# Patient Record
Sex: Female | Born: 1950 | Race: White | Hispanic: No | Smoking: Former smoker
Health system: Southern US, Community
[De-identification: ages and names within clinical notes are randomized; demographics above are authoritative.]

## PROBLEM LIST (undated history)

## (undated) DIAGNOSIS — K635 Polyp of colon: Secondary | ICD-10-CM

## (undated) DIAGNOSIS — M1990 Unspecified osteoarthritis, unspecified site: Secondary | ICD-10-CM

## (undated) DIAGNOSIS — Z720 Tobacco use: Secondary | ICD-10-CM

## (undated) DIAGNOSIS — M25552 Pain in left hip: Principal | ICD-10-CM

## (undated) DIAGNOSIS — Z87891 Personal history of nicotine dependence: Secondary | ICD-10-CM

## (undated) DIAGNOSIS — C801 Malignant (primary) neoplasm, unspecified: Secondary | ICD-10-CM

## (undated) DIAGNOSIS — Z0000 Encounter for general adult medical examination without abnormal findings: Secondary | ICD-10-CM

## (undated) DIAGNOSIS — E785 Hyperlipidemia, unspecified: Secondary | ICD-10-CM

## (undated) DIAGNOSIS — F418 Other specified anxiety disorders: Secondary | ICD-10-CM

## (undated) DIAGNOSIS — B019 Varicella without complication: Secondary | ICD-10-CM

## (undated) DIAGNOSIS — R030 Elevated blood-pressure reading, without diagnosis of hypertension: Secondary | ICD-10-CM

## (undated) HISTORY — DX: Elevated blood-pressure reading, without diagnosis of hypertension: R03.0

## (undated) HISTORY — DX: Unspecified osteoarthritis, unspecified site: M19.90

## (undated) HISTORY — DX: Personal history of nicotine dependence: Z87.891

## (undated) HISTORY — DX: Pain in left hip: M25.552

## (undated) HISTORY — PX: WRIST FRACTURE SURGERY: SHX121

## (undated) HISTORY — DX: Malignant (primary) neoplasm, unspecified: C80.1

## (undated) HISTORY — DX: Varicella without complication: B01.9

## (undated) HISTORY — DX: Other specified anxiety disorders: F41.8

## (undated) HISTORY — DX: Tobacco use: Z72.0

## (undated) HISTORY — DX: Hyperlipidemia, unspecified: E78.5

## (undated) HISTORY — DX: Polyp of colon: K63.5

## (undated) HISTORY — PX: COLONOSCOPY: SHX174

## (undated) HISTORY — PX: POLYPECTOMY: SHX149

## (undated) HISTORY — DX: Encounter for general adult medical examination without abnormal findings: Z00.00

---

## 1996-11-28 HISTORY — PX: BREAST LUMPECTOMY: SHX2

## 1998-09-16 ENCOUNTER — Other Ambulatory Visit: Admission: RE | Admit: 1998-09-16 | Discharge: 1998-09-16 | Payer: Self-pay | Admitting: Obstetrics and Gynecology

## 1999-10-14 ENCOUNTER — Other Ambulatory Visit: Admission: RE | Admit: 1999-10-14 | Discharge: 1999-10-14 | Payer: Self-pay | Admitting: Obstetrics and Gynecology

## 2000-02-18 ENCOUNTER — Encounter (INDEPENDENT_AMBULATORY_CARE_PROVIDER_SITE_OTHER): Payer: Self-pay

## 2000-02-18 ENCOUNTER — Other Ambulatory Visit: Admission: RE | Admit: 2000-02-18 | Discharge: 2000-02-18 | Payer: Self-pay | Admitting: Obstetrics and Gynecology

## 2000-10-17 ENCOUNTER — Other Ambulatory Visit: Admission: RE | Admit: 2000-10-17 | Discharge: 2000-10-17 | Payer: Self-pay | Admitting: Obstetrics and Gynecology

## 2002-01-02 ENCOUNTER — Other Ambulatory Visit: Admission: RE | Admit: 2002-01-02 | Discharge: 2002-01-02 | Payer: Self-pay | Admitting: Obstetrics and Gynecology

## 2003-02-25 ENCOUNTER — Other Ambulatory Visit: Admission: RE | Admit: 2003-02-25 | Discharge: 2003-02-25 | Payer: Self-pay | Admitting: Obstetrics and Gynecology

## 2004-10-25 ENCOUNTER — Other Ambulatory Visit: Admission: RE | Admit: 2004-10-25 | Discharge: 2004-10-25 | Payer: Self-pay | Admitting: Obstetrics and Gynecology

## 2005-09-02 ENCOUNTER — Ambulatory Visit: Payer: Self-pay | Admitting: Oncology

## 2005-10-31 ENCOUNTER — Other Ambulatory Visit: Admission: RE | Admit: 2005-10-31 | Discharge: 2005-10-31 | Payer: Self-pay | Admitting: Obstetrics and Gynecology

## 2006-08-24 ENCOUNTER — Ambulatory Visit: Payer: Self-pay | Admitting: Oncology

## 2006-11-01 ENCOUNTER — Other Ambulatory Visit: Admission: RE | Admit: 2006-11-01 | Discharge: 2006-11-01 | Payer: Self-pay | Admitting: Obstetrics and Gynecology

## 2007-08-30 ENCOUNTER — Ambulatory Visit: Payer: Self-pay | Admitting: Oncology

## 2007-09-03 LAB — CBC WITH DIFFERENTIAL/PLATELET
Basophils Absolute: 0 10*3/uL (ref 0.0–0.1)
EOS%: 0.9 % (ref 0.0–7.0)
HGB: 13.1 g/dL (ref 11.6–15.9)
MCH: 31.2 pg (ref 26.0–34.0)
MCV: 89.6 fL (ref 81.0–101.0)
MONO%: 4.9 % (ref 0.0–13.0)
RDW: 13.3 % (ref 11.3–14.5)

## 2007-09-03 LAB — COMPREHENSIVE METABOLIC PANEL
AST: 13 U/L (ref 0–37)
Albumin: 4.4 g/dL (ref 3.5–5.2)
Alkaline Phosphatase: 62 U/L (ref 39–117)
BUN: 20 mg/dL (ref 6–23)
Creatinine, Ser: 0.6 mg/dL (ref 0.40–1.20)
Potassium: 3.8 mEq/L (ref 3.5–5.3)
Total Bilirubin: 0.3 mg/dL (ref 0.3–1.2)

## 2008-02-27 ENCOUNTER — Other Ambulatory Visit: Admission: RE | Admit: 2008-02-27 | Discharge: 2008-02-27 | Payer: Self-pay | Admitting: Obstetrics and Gynecology

## 2008-07-16 ENCOUNTER — Inpatient Hospital Stay (HOSPITAL_COMMUNITY): Admission: EM | Admit: 2008-07-16 | Discharge: 2008-07-17 | Payer: Self-pay | Admitting: Emergency Medicine

## 2008-07-16 ENCOUNTER — Encounter (INDEPENDENT_AMBULATORY_CARE_PROVIDER_SITE_OTHER): Payer: Self-pay | Admitting: General Surgery

## 2008-11-28 HISTORY — PX: APPENDECTOMY: SHX54

## 2009-10-08 ENCOUNTER — Ambulatory Visit: Payer: Self-pay | Admitting: Obstetrics and Gynecology

## 2009-10-08 ENCOUNTER — Encounter: Payer: Self-pay | Admitting: Obstetrics and Gynecology

## 2009-10-08 ENCOUNTER — Other Ambulatory Visit: Admission: RE | Admit: 2009-10-08 | Discharge: 2009-10-08 | Payer: Self-pay | Admitting: Obstetrics and Gynecology

## 2010-01-26 ENCOUNTER — Encounter (INDEPENDENT_AMBULATORY_CARE_PROVIDER_SITE_OTHER): Payer: Self-pay | Admitting: *Deleted

## 2010-02-25 ENCOUNTER — Encounter (INDEPENDENT_AMBULATORY_CARE_PROVIDER_SITE_OTHER): Payer: Self-pay | Admitting: *Deleted

## 2010-03-01 ENCOUNTER — Ambulatory Visit: Payer: Self-pay | Admitting: Gastroenterology

## 2010-03-16 ENCOUNTER — Ambulatory Visit: Payer: Self-pay | Admitting: Gastroenterology

## 2010-03-17 ENCOUNTER — Encounter: Payer: Self-pay | Admitting: Gastroenterology

## 2010-12-20 ENCOUNTER — Encounter: Payer: Self-pay | Admitting: Emergency Medicine

## 2010-12-30 NOTE — Miscellaneous (Signed)
Summary: [facility]  Clinical Lists Changes  Medications: Added new medication of MOVIPREP 100 GM  SOLR (PEG-KCL-NACL-NASULF-NA ASC-C) As per prep instructions. - Signed Rx of MOVIPREP 100 GM  SOLR (PEG-KCL-NACL-NASULF-NA ASC-C) As per prep instructions.;  #1 x 0;  Signed;  Entered by: Ezra Sites RN;  Authorized by: Meryl Dare MD [facility];  Method used: [phone] [address] [place], [place], [place]  [address], Ph: [phone] or [phone], Fax: [phone] Observations: Added new observation of NKA: T (03/01/2010 15:39)    Prescriptions: MOVIPREP 100 GM  SOLR (PEG-KCL-NACL-NASULF-NA ASC-C) As per prep instructions.  #1 x 0   Entered by:   Ezra Sites RN   Authorized by:   Meryl Dare MD [facility]   Signed by:   Ezra Sites RN on 03/01/2010   Method used:   [facility]        [facility]  [address] [phone]ail[facility]       [address]       [place], [place]  [address]       Ph: [phone] or [phone]       Fax: [phone]   RxID:   [id number]

## 2010-12-30 NOTE — Letter (Signed)
Summary: [facility] Instructions  [facility]  [address] [place], [place] [address]   Phone: [phone]  Fax: [phone]       [name]    1951/02/25    MRN: [id number]        Procedure Day /Date:  Tuesday 03/16/10     Arrival Time: 8:30 AM      Procedure Time:  9:30 AM     Location of Procedure:                    _X_  [facility] (4th Floor)                      PREPARATION FOR COLONOSCOPY WITH MOVIPREP   Starting 5 days prior to your procedure Thursday 03/11/10 do not eat nuts, seeds, popcorn, corn, beans, peas,  salads, or any raw vegetables.  Do not take any fiber supplements (e.g. Metamucil, Citrucel, and Benefiber).  THE DAY BEFORE YOUR PROCEDURE         DATE: 03/15/10  DAY: Monday  1.  Drink clear liquids the entire day-NO SOLID FOOD  2.  Do not drink anything colored red or purple.  Avoid juices with pulp.  No orange juice.  3.  Drink at least 64 oz. (8 glasses) of fluid/clear liquids during the day to prevent dehydration and help the prep work efficiently.  CLEAR LIQUIDS INCLUDE: Water Jello Ice Popsicles Tea (sugar ok, no milk/cream) Powdered fruit flavored drinks Coffee (sugar ok, no milk/cream) Gatorade Juice: apple, white grape, white cranberry  Lemonade Clear bullion, consomm, broth Carbonated beverages (any kind) Strained chicken noodle soup Hard Candy                             4.  In the morning, mix first dose of MoviPrep solution:    Empty 1 Pouch A and 1 Pouch B into the disposable container    Add lukewarm drinking water to the top line of the container. Mix to dissolve    Refrigerate (mixed solution should be used within 24 hrs)  5.  Begin drinking the prep at 5:00 p.m. The MoviPrep container is divided by 4 marks.   Every 15 minutes drink the solution down to the next mark (approximately 8 oz) until the full liter is complete.   6.  Follow completed prep with 16 oz of clear liquid of your choice  (Nothing red or purple).  Continue to drink clear liquids until bedtime.  7.  Before going to bed, mix second dose of MoviPrep solution:    Empty 1 Pouch A and 1 Pouch B into the disposable container    Add lukewarm drinking water to the top line of the container. Mix to dissolve    Refrigerate  THE DAY OF YOUR PROCEDURE      DATE: 03/16/10 DAY: Tuesday  Beginning at 4:30 a.m. (5 hours before procedure):         1. Every 15 minutes, drink the solution down to the next mark (approx 8 oz) until the full liter is complete.  2. Follow completed prep with 16 oz. of clear liquid of your choice.    3. You may drink clear liquids until 7:30 AM (2 HOURS BEFORE PROCEDURE).   MEDICATION INSTRUCTIONS  Unless otherwise instructed, you should take regular prescription medications with a small sip of water   as early as possible the morning  of your procedure.         OTHER INSTRUCTIONS  You will need a responsible adult at least 60 years of age to accompany you and drive you home.   This person must remain in the waiting room during your procedure.  Wear loose fitting clothing that is easily removed.  Leave jewelry and other valuables at home.  However, you may wish to bring a book to read or  an iPod/MP3 player to listen to music as you wait for your procedure to start.  Remove all body piercing jewelry and leave at home.  Total time from sign-in until discharge is approximately 2-3 hours.  You should go home directly after your procedure and rest.  You can resume normal activities the  day after your procedure.  The day of your procedure you should not:   Drive   Make legal decisions   Operate machinery   Drink alcohol   Return to work  You will receive specific instructions about eating, activities and medications before you leave.    The above instructions have been reviewed and explained to me by   Ezra Sites RN  March 01, 2010 3:59 PM    I fully understand  and can verbalize these instructions _____________________________ Date _________

## 2010-12-30 NOTE — Letter (Signed)
Summary: Patient Notice- Polyp Results  [facility]  [address] [place], [place] [address]   Phone: [phone]  Fax: [phone]        March 17, 2010 MRN: [id number]    SHARAINE DELANGE [address] [place], [place]  [address]    Dear Ms. Seidel,  I am pleased to inform you that the colon polyp(s) removed during your recent colonoscopy was (were) found to be benign (no cancer detected) upon pathologic examination.  I recommend you have a repeat colonoscopy examination in 3 years to look for recurrent polyps, as having colon polyps increases your risk for having recurrent polyps or even colon cancer in the future.  Should you develop new or worsening symptoms of abdominal pain, bowel habit changes or bleeding from the rectum or bowels, please schedule an evaluation with either your primary care physician or with me.  Continue treatment plan as outlined the day of your exam.  Please call us if you are having persistent problems or have questions about your condition that have not been fully answered at this time.  Sincerely,  Meryl Dare MD [facility]  This letter has been electronically signed by your physician.  Appended Document: Patient Notice- Polyp Results letter mailed 4.25.11

## 2010-12-30 NOTE — Letter (Signed)
Summary: Previsit letter  [facility]  [address] [place], [place] [address]   Phone: [phone]  Fax: [phone]       01/26/2010 MRN: [id number]  [name] [address] [place], [place]  [address]  Dear [name],  Welcome to the Gastroenterology Division at [facility].    You are scheduled to see a nurse for your pre-procedure visit on 03-01-10 at 3:30p.m. on the 3rd floor at [facility], [address]  We ask that you try to arrive at our office 15 minutes prior to your appointment time to allow for check-in.  Your nurse visit will consist of discussing your medical and surgical history, your immediate family medical history, and your medications.    Please bring a complete list of all your medications or, if you prefer, bring the medication bottles and we will list them.  We will need to be aware of both prescribed and over the counter drugs.  We will need to know exact dosage information as well.  If you are on blood thinners (Coumadin, Plavix, Aggrenox, Ticlid, etc.) please call our office today/prior to your appointment, as we need to consult with your physician about holding your medication.   Please be prepared to read and sign documents such as consent forms, a financial agreement, and acknowledgement forms.  If necessary, and with your consent, a friend or relative is welcome to sit-in on the nurse visit with you.  Please bring your insurance card so that we may make a copy of it.  If your insurance requires a referral to see a specialist, please bring your referral form from your primary care physician.  No co-pay is required for this nurse visit.     If you cannot keep your appointment, please call [phone] to cancel or reschedule prior to your appointment date.  This allows [place] the opportunity to schedule an appointment for another patient in need of care.    Thank you for choosing [facility] for your medical needs.   We appreciate the opportunity to care for you.  Please visit [place] at our website  to learn more about our practice.                     Sincerely.                                                                                                                   The Gastroenterology Division

## 2010-12-30 NOTE — Procedures (Signed)
Summary: Colonoscopy  Patient: [name] Note: All result statuses are Final unless otherwise noted.  Tests: (1) Colonoscopy (COL)   COL Colonoscopy           DONE     [facility]     [address]     [place], [place]  [address]           COLONOSCOPY PROCEDURE REPORT           PATIENT:  Brittany, Mitchell  MR#:  [id number]     BIRTHDATE:  [birth year], 58 yrs. old  GENDER:  female           ENDOSCOPIST:  Judie Petit T. Russella Dar, MD, [facility]     Referred by:  Edyth Gunnels, M.D.           PROCEDURE DATE:  03/16/2010     PROCEDURE:  Colonoscopy with snare polypectomy     ASA CLASS:  Class II     INDICATIONS:  1) Routine Risk Screening           MEDICATIONS:   Fentanyl 100 mcg IV, Versed 10 mg IV           DESCRIPTION OF PROCEDURE:   After the risks benefits and     alternatives of the procedure were thoroughly explained, informed     consent was obtained.  Digital rectal exam was performed and     revealed no abnormalities.   The LB PCF-H180AL [id number] endoscope     was introduced through the anus and advanced to the cecum, which     was identified by both the appendix and ileocecal valve, without     limitations.  The quality of the prep was excellent, using     MoviPrep.  The instrument was then slowly withdrawn as the colon     was fully examined.     <<PROCEDUREIMAGES>>           FINDINGS:  A sessile polyp was found in the ascending colon. It     was 6 mm in size. Polyp was snared, then cauterized with monopolar     cautery. Retrieval was successful. A sessile polyp was found at     the hepatic flexure. It was 3 mm in size. Polyp was snared without     cautery. Retrieval was successful.  A sessile polyp was found in     the descending colon. It was 5 mm in size. Polyp was snared     without cautery. Retrieval was successful. A pedunculated polyp     was found in the sigmoid colon. It was 7 mm in size. Polyp was     snared, then cauterized with monopolar cautery.  Retrieval was     successful. A sessile polyp was found in the sigmoid colon. It was     6 mm in size. Polyp was snared without cautery. Retrieval was     successful.   Moderate diverticulosis was found in the sigmoid     colon. This was otherwise a normal examination of the colon.     Retroflexed views in the rectum revealed no abnormalities. The     time to cecum =  3  minutes. The scope was then withdrawn (time =     11  min) from the patient and the procedure completed.     COMPLICATIONS:  None           ENDOSCOPIC IMPRESSION:     1) 6 mm sessile polyp  in the ascending colon     2) 3 mm sessile polyp at the hepatic flexure     3) 5 mm sessile polyp in the descending colon     4) 7 mm pedunculated polyp in the sigmoid colon     5) 6 mm sessile polyp in the sigmoid colon     6) Moderate diverticulosis in the sigmoid colon           RECOMMENDATIONS:     1) No aspirin or NSAIDs for 2 weeks     2) Await pathology results     3) Repeat Colonoscopy in 3 years if 3 or more adenomatous, 5     years if 1 o2 adenomatous, otherwise 10years     4) High fiber diet with liberal fluid intake.           Venita Lick. Russella Dar, MD, [facility]           CC:           n.     eSIGNED:   Venita Lick. Romolo Sieling at 03/16/2010 09:59 AM           Elenor Legato, [id number]  Note: An exclamation mark (!) indicates a result that was not dispersed into the flowsheet. Document Creation Date: 03/16/2010 10:01 AM _______________________________________________________________________  (1) Order result status: Final Collection or observation date-time: 03/16/2010 09:54 Requested date-time:  Receipt date-time:  Reported date-time:  Referring Physician:   Ordering Physician: Claudette Head [phone]) Specimen Source:  Source: Launa Grill Order Number: [id number] Lab site:   Appended Document: Colonoscopy     Procedures Next Due Date:    Colonoscopy: 02/2013

## 2011-04-12 NOTE — Op Note (Signed)
NAME:  [name], BONES NO.:  [id number]   MEDICAL RECORD NO.:  [id number]          PATIENT TYPE:  INP   LOCATION:  0105                         FACILITY:  [facility]   PHYSICIAN:  Almond Lint, MD       DATE OF BIRTH:  03-15-1951   DATE OF PROCEDURE:  07/16/2008  DATE OF DISCHARGE:                               OPERATIVE REPORT   PREOPERATIVE DIAGNOSIS:  Appendicitis.   POSTOPERATIVE DIAGNOSIS:  Appendicitis.   PROCEDURE:  Laparoscopic appendectomy.   SURGEON:  Almond Lint, M.D.   ASSISTANT:  Currie Paris, M.D.   ANESTHESIA:  General.   FINDINGS:  Acute suppurative nonperforated appendicitis with fecalith.   SPECIMENS:  Appendix to pathology.   ESTIMATED BLOOD LOSS:  Minimal.   COMPLICATIONS:  None.   PROCEDURE:  Ms. Hooper was identified correctly in the holding area  and taken to the operating room, where she was placed supine on the  operating room table.  General endotracheal anesthesia was induced, and  a Foley catheter was placed.  A time-out was performed, confirming the  patient's site of surgery, the preoperative antibiotic administration,  and the equipment.  This was all correct.  The patient then had her left  arm tucked, and her abdomen was prepped and draped in a sterile fashion.  A curvilinear incision was made in the infraumbilical region after  administration of 1% plain 0.25% lidocaine with epinephrine  infiltration.  The subcutaneous tissue was bluntly divided with a  tonsil, and a Kocher clamp was used to elevate the umbilical stalk.  Kocher clamps were then placed on the fascia on both sides of the  midline.  An incision was made in the midline of the fascia.  A tonsil  was used to verify that we had entered the abdomen through the fascia.  A 0 Vicryl on a UR6 was placed in a purse-string fashion around the  fascial defect.  The Hasson trocar was introduced, and pneumoperitoneum  was achieved to 15 mmHg.   The abdomen was  inspected, and the cecum and appendiceal area were  covered with the omentum at this time.  The left lower quadrant  abdominal wall was then inspected with a scope, and a 5 mm port was  placed under direct visualization in the usual fashion.  A Glassman  clamp was used to elevate the omentum off of the cecum, and immediately  the inflamed appendix was seen.  It was adherent to the terminal ileum  with purulent drainage.  The Glassman clamp was used to elevate the  appendix, and it separated easily from the terminal ileum.  An  additional 5 mm port was placed in the usual fashion far laterally in  the left lower quadrant.  The appendix was elevated, and blunt  dissection was used to continue the removal of the appendix from the  terminal ileum.  The harmonic scalpel was then used to divide the  appendiceal mesentery.  Following division of the mesentery all the way  to the base of the appendix, the Endo GIA was used to transect the  appendix at its  base.  Both tips of the stapler were seen going across  only the appendix.  This was held for 10 seconds, and then the stapler  was fired.  The appendix was then placed into an EndoCatch bag and  removed through the periumbilical port site.  Pneumoperitoneum was again  achieved, and the appendiceal stump was inspected.  There was no  bleeding seen.  No damage to the terminal ileum or cecum was seen.  The  ara was irrigated and suctioned.  The two 5 mm ports were then removed  under direct visualization with no bleeding.  The camera and Hasson  trocar were removed from the abdomen.  The purse-string suture was then  tied, and the fascia inspected to insure that there were no defects.  Following this, a 4-0 Monocryl was used to close the skin in a  subcuticular fashion.  The abdomen was then cleaned and dried.  The  incisions were closed with Dermabond.  The patient tolerated the  procedure well.  She was awakened from anesthesia and taken to the  PACU  in a stable condition.      Almond Lint, MD  Electronically Signed     FB/MEDQ  D:  07/16/2008  T:  07/16/2008  Job:  [id number]

## 2011-04-12 NOTE — H&P (Signed)
NAME:  [name], [name] NO.:  [id number]   MEDICAL RECORD NO.:  [id number]          PATIENT TYPE:  INP   LOCATION:  0105                         FACILITY:  [facility]   PHYSICIAN:  Almond Lint, MD       DATE OF BIRTH:  [birth year]   DATE OF ADMISSION:  07/16/2008  DATE OF DISCHARGE:                              HISTORY & PHYSICAL   REFERRING PHYSICIAN:  Dr. Elise Benne.   CHIEF COMPLAINT:  Abdominal pain.   HISTORY OF PRESENT ILLNESS:  [name] is  60 year old female who  began having abdominal discomfort yesterday at work.  She tried some  Alka-Seltzer, and it seemed to help a little bit; however, she did not  have any appetite for dinner.  She originally thought the pain was gas  pains, but it was not moving around as much as she had experienced with  gas pain.  It seemed to be in mostly the lower abdomen.  She also tried  Tums.  She woke up multiple times in the middle of the night for pain  and also had some mild diarrhea.  This morning, she was nauseated and  had some toast and threw that up.  Over the course of the day, the pain  has continued to worsen.  She presented to the [facility] Emergency  Department for evaluation.  A CT scan was done there, which was positive  for appendicitis.   PAST MEDICAL HISTORY:  1. Right breast cancer.  2. Osteoporosis.   PAST SURGICAL HISTORY:  Right lumpectomy.   MEDICATIONS:  Boniva.  Multiple vitamins.   ALLERGIES:  None.   SOCIAL HISTORY:  She smokes a half-pack per day.  Does not drink  alcohol.  Works in [employer].  She is accompanied by her husband.   FAMILY HISTORY:  Mother had diabetes and congestive heart failure and a  heart attack, otherwise not significant.   REVIEW OF SYSTEMS:  Negative for fevers and chills.  Positive for the  HPI.  Negative for 11 other systems.   PHYSICAL EXAMINATION:  Temperature is 98.6.  At [facility] ER, it is  97.6.  Pulse is 73.  Blood pressure is 101/64.  Respiratory rate  18.  Sats are 96% on room air.  She is alert and oriented x3 in no acute distress.  HEENT:  Normocephalic and atraumatic.  Pupils are equal, round and  reactive to light.  Oropharynx is clear.  NECK:  Supple with no lymphadenopathy, no thyromegaly.  Trachea is  midline.  There is no supra- or infraclavicular adenopathy.  HEART:  Regular rate and rhythm with no murmurs.  LUNGS:  Clear to auscultation bilaterally.  ABDOMEN:  Soft, nondistended.  Tender to deep palpation in the right  lower quadrant.  No rebound, no guarding.  No Rovsing's sign.   LABS:  From [facility], her electrolytes are within normal limits.  Specifically, with a creatinine of 0.6, lipase 30.  White blood cell  count is 17.5 with a neutrophil percentage of 87%.  UA is negative.   CT scan, which is actually renal stone protocol, but  demonstrates an  appendicolith x2 and a markedly distended appendix and inflammatory  changes to the adjacent fat.  This was viewed and discussed with the  radiologist.   ASSESSMENT:  [name] is a 60 year old female with acute  appendicitis.  We will take her to the operating room for a laparoscopic  appendectomy.  We will give her a dose of antibiotics, antiemetics, and  some pain medicine.  The risks and benefits of the procedure were  described to the patient.  She agrees to proceed.  We will take her to  the operating room as soon as one is available.      Almond Lint, MD  Electronically Signed     FB/MEDQ  D:  07/16/2008  T:  07/16/2008  Job:  [id number]

## 2011-04-15 NOTE — Discharge Summary (Signed)
NAMETALITHA, [name] NO.:  [id number]   MEDICAL RECORD NO.:  [id number]          PATIENT TYPE:  INP   LOCATION:  1530                         FACILITY:  [facility]   PHYSICIAN:  Almond Lint, MD       DATE OF BIRTH:  June 29, 1951   DATE OF ADMISSION:  07/16/2008  DATE OF DISCHARGE:  07/17/2008                               DISCHARGE SUMMARY   DIAGNOSIS:  Acute appendicitis.   PROCEDURES PERFORMED:  Laparoscopic appendectomy.   HOSPITAL COURSE:  [name] is a 60 year old female that was  originally seen in the [facility] Emergency Department with a CT scan  consistent with appendicitis and with leukocytosis.  She had story  consistent with appendicitis.  She was transferred to [facility] where  I consulted her and then agreed that she appeared to have acute  appendicitis.  She was taken to the operation room on July 16, 2008 to  undergo an uneventful laparoscopic appendectomy for suppurative  nongangrenous, nonperforated appendicitis.  The night of surgery she did  have a little bit of nausea and required some IV antiemetics and IV pain  medication.  However by the morning her nausea was resolved and she was  able to tolerate a regular diet with no difficulty.  Her pain was  controlled on oral narcotics.  She was able to void spontaneously and  ambulate and she was discharged to home in stable condition.      Almond Lint, MD  Electronically Signed     FB/MEDQ  D:  07/18/2008  T:  07/18/2008  Job:  [id number]

## 2011-05-04 ENCOUNTER — Ambulatory Visit (INDEPENDENT_AMBULATORY_CARE_PROVIDER_SITE_OTHER): Payer: 59 | Admitting: Family Medicine

## 2011-05-04 ENCOUNTER — Encounter: Payer: Self-pay | Admitting: Family Medicine

## 2011-05-04 DIAGNOSIS — E785 Hyperlipidemia, unspecified: Secondary | ICD-10-CM

## 2011-05-04 DIAGNOSIS — M25559 Pain in unspecified hip: Secondary | ICD-10-CM

## 2011-05-04 DIAGNOSIS — Z720 Tobacco use: Secondary | ICD-10-CM

## 2011-05-04 DIAGNOSIS — F419 Anxiety disorder, unspecified: Secondary | ICD-10-CM

## 2011-05-04 DIAGNOSIS — F341 Dysthymic disorder: Secondary | ICD-10-CM

## 2011-05-04 DIAGNOSIS — M25552 Pain in left hip: Secondary | ICD-10-CM

## 2011-05-04 DIAGNOSIS — M8580 Other specified disorders of bone density and structure, unspecified site: Secondary | ICD-10-CM

## 2011-05-04 DIAGNOSIS — C50911 Malignant neoplasm of unspecified site of right female breast: Secondary | ICD-10-CM | POA: Insufficient documentation

## 2011-05-04 DIAGNOSIS — R5383 Other fatigue: Secondary | ICD-10-CM

## 2011-05-04 DIAGNOSIS — M5410 Radiculopathy, site unspecified: Secondary | ICD-10-CM

## 2011-05-04 DIAGNOSIS — C50919 Malignant neoplasm of unspecified site of unspecified female breast: Secondary | ICD-10-CM

## 2011-05-04 DIAGNOSIS — IMO0002 Reserved for concepts with insufficient information to code with codable children: Secondary | ICD-10-CM

## 2011-05-04 DIAGNOSIS — K635 Polyp of colon: Secondary | ICD-10-CM | POA: Insufficient documentation

## 2011-05-04 DIAGNOSIS — M899 Disorder of bone, unspecified: Secondary | ICD-10-CM

## 2011-05-04 HISTORY — DX: Pain in left hip: M25.552

## 2011-05-04 MED ORDER — BUPROPION HCL ER (XL) 150 MG PO TB24: ORAL_TABLET | ORAL | Status: DC

## 2011-05-04 MED ORDER — LORAZEPAM 0.5 MG PO TABS: 0.5000 mg | ORAL_TABLET | Freq: Three times a day (TID) | ORAL | Status: DC | PRN

## 2011-05-04 MED ORDER — CYCLOBENZAPRINE HCL 10 MG PO TABS: 10.0000 mg | ORAL_TABLET | Freq: Three times a day (TID) | ORAL | Status: AC | PRN

## 2011-05-04 MED ORDER — CELECOXIB 100 MG PO CAPS: 100.0000 mg | ORAL_CAPSULE | Freq: Every day | ORAL | Status: DC

## 2011-05-04 NOTE — Patient Instructions (Signed)
Smoking Cessation This document explains the best ways for you to quit smoking and new treatments to help. It lists new medicines that can double or triple your chances of quitting and quitting for good. It also considers ways to avoid relapses and concerns you may have about quitting, including weight gain. NICOTINE: A POWERFUL ADDICTION If you have tried to quit smoking, you know how hard it can be. It is hard because nicotine is a very addictive drug. For some people, it can be as addictive as heroin or cocaine. Usually, people make 2 or 3 tries, or more, before finally being able to quit. Each time you try to quit, you can learn about what helps and what hurts. Quitting takes hard work and a lot of effort, but you can quit smoking. QUITTING SMOKING IS ONE OF THE MOST IMPORTANT THINGS YOU WILL EVER DO:  You will live longer, feel better, and live better.   The impact on your body of quitting smoking is felt almost immediately:   Within 20 minutes, blood pressure decreases. Pulse returns to its normal level.   After 8 hours, carbon monoxide levels in the blood return to normal. Oxygen level increases.   After 24 hours, chance of heart attack starts to decrease. Breath, hair, and body stop smelling like smoke.   After 48 hours, damaged nerve endings begin to recover. Sense of taste and smell improve.   After 72 hours, the body is virtually free of nicotine. Bronchial tubes relax and breathing becomes easier.   After 2 to 12 weeks, lungs can hold more air. Exercise becomes easier and circulation improves.   Quitting will lower your chance of having a heart attack, stroke, cancer, or lung disease:   After 1 year, the risk of coronary heart disease is cut in half.   After 5 years, the risk of stroke falls to the same as a nonsmoker.   After 10 years, the risk of lung cancer is cut in half and the risk of other cancers decreases significantly.   After 15 years, the risk of coronary heart  disease drops, usually to the level of a nonsmoker.   If you are pregnant, quitting smoking will improve your chances of having a healthy baby.   The people you live with, especially your children, will be healthier.   You will have extra money to spend on things other than cigarettes.  FIVE KEYS TO QUITTING Studies have shown that these 5 steps will help you quit smoking and quit for good. You have the best chances of quitting if you use them together: 1. Get ready.  2. Get support and encouragement.  3. Learn new skills and behaviors.  4. Get medicine to reduce your nicotine addiction and use it correctly.  5. Be prepared for relapse or difficult situations. Be determined to continue trying to quit, even if you do not succeed at first.  1. GET READY  Set a quit date.   Change your environment.   Get rid of ALL cigarettes, ashtrays, matches, and lighters in your home, car, and place of work.   Do not let people smoke in your home.   Review your past attempts to quit. Think about what worked and what did not.   Once you quit, do not smoke. NOT EVEN A PUFF!  2. GET SUPPORT AND ENCOURAGEMENT Studies have shown that you have a better chance of being successful if you have help. You can get support in many ways.  Tell  your family, friends, and coworkers that you are going to quit and need their support. Ask them not to smoke around you.   Talk to your caregivers (doctor, dentist, nurse, pharmacist, psychologist, and/or smoking counselor).   Get individual, group, or telephone counseling and support. The more counseling you have, the better your chances are of quitting. Programs are available at local hospitals and health centers. Call your local health department for information about programs in your area.   Spiritual beliefs and practices may help some smokers quit.   Quit meters are small computer programs online or downloadable that keep track of quit statistics, such as amount  of "quit-time," cigarettes not smoked, and money saved.   Many smokers find one or more of the many self-help books available useful in helping them quit and stay off tobacco.  3. LEARN NEW SKILLS AND BEHAVIORS  Try to distract yourself from urges to smoke. Talk to someone, go for a walk, or occupy your time with a task.   When you first try to quit, change your routine. Take a different route to work. Drink tea instead of coffee. Eat breakfast in a different place.   Do something to reduce your stress. Take a hot bath, exercise, or read a book.   Plan something enjoyable to do every day. Reward yourself for not smoking.   Explore interactive web-based programs that specialize in helping you quit.  4. GET MEDICINE AND USE IT CORRECTLY Medicines can help you stop smoking and decrease the urge to smoke. Combining medicine with the above behavioral methods and support can quadruple your chances of successfully quitting smoking. The [place] Food and Drug Administration (FDA) has approved 7 medicines to help you quit smoking. These medicines fall into 3 categories.  Nicotine replacement therapy (delivers nicotine to your body without the negative effects and risks of smoking):   Nicotine gum: Available over-the-counter.   Nicotine lozenges: Available over-the-counter.   Nicotine inhaler: Available by prescription.   Nicotine nasal spray: Available by prescription.   Nicotine skin patches (transdermal): Available by prescription and over-the-counter.   Antidepressant medicine (helps people abstain from smoking, but how this works is unknown):   Bupropion sustained-release (SR) tablets: Available by prescription.   Nicotinic receptor partial agonist (simulates the effect of nicotine in your brain):   Varenicline tartrate tablets: Available by prescription.   Ask your caregiver for advice about which medicines to use and how to use them. Carefully read the information on the package.    Everyone who is trying to quit may benefit from using a medicine. If you are pregnant or trying to become pregnant, nursing an infant, you are under age 18, or you smoke fewer than 10 cigarettes per day, talk to your caregiver before taking any nicotine replacement medicines.   You should stop using a nicotine replacement product and call your caregiver if you experience nausea, dizziness, weakness, vomiting, fast or irregular heartbeat, mouth problems with the lozenge or gum, or redness or swelling of the skin around the patch that does not go away.   Do not use any other product containing nicotine while using a nicotine replacement product.   Talk to your caregiver before using these products if you have diabetes, heart disease, asthma, stomach ulcers, you had a recent heart attack, you have high blood pressure that is not controlled with medicine, a history of irregular heartbeat, or you have been prescribed medicine to help you quit smoking.  5. BE PREPARED FOR RELAPSE OR   DIFFICULT SITUATIONS  Most relapses occur within the first 3 months after quitting. Do not be discouraged if you start smoking again. Remember, most people try several times before they finally quit.   You may have symptoms of withdrawal because your body is used to nicotine. You may crave cigarettes, be irritable, feel very hungry, cough often, get headaches, or have difficulty concentrating.   The withdrawal symptoms are only temporary. They are strongest when you first quit, but they will go away within 10 to 14 days.  Here are some difficult situations to watch for:  Alcohol. Avoid drinking alcohol. Drinking lowers your chances of successfully quitting.   Caffeine. Try to reduce the amount of caffeine you consume. It also lowers your chances of successfully quitting.   Other smokers. Being around smoking can make you want to smoke. Avoid smokers.   Weight gain. Many smokers will gain weight when they quit, usually  less than 10 pounds. Eat a healthy diet and stay active. Do not let weight gain distract you from your main goal, quitting smoking. Some medicines that help you quit smoking may also help delay weight gain. You can always lose the weight gained after you quit.   Bad mood or depression. There are a lot of ways to improve your mood other than smoking.  If you are having problems with any of these situations, talk to your caregiver. SPECIAL SITUATIONS OR CONDITIONS Studies suggest that everyone can quit smoking. Your situation or condition can give you a special reason to quit.  Pregnant women/New mothers: By quitting, you protect your baby's health and your own.   Hospitalized patients: By quitting, you reduce health problems and help healing.   Heart attack patients: By quitting, you reduce your risk of a second heart attack.   Lung, head, and neck cancer patients: By quitting, you reduce your chance of a second cancer.   Parents of children and adolescents: By quitting, you protect your children from illnesses caused by secondhand smoke.  QUESTIONS TO THINK ABOUT Think about the following questions before you try to stop smoking. You may want to talk about your answers with your caregiver.  Why do you want to quit?   If you tried to quit in the past, what helped and what did not?   What will be the most difficult situations for you after you quit? How will you plan to handle them?   Who can help you through the tough times? Your family? Friends? Caregiver?   What pleasures do you get from smoking? What ways can you still get pleasure if you quit?  Here are some questions to ask your caregiver:  How can you help me to be successful at quitting?   What medicine do you think would be best for me and how should I take it?   What should I do if I need more help?   What is smoking withdrawal like? How can I get information on withdrawal?  Quitting takes hard work and a lot of effort,  but you can quit smoking. FOR MORE INFORMATION Smokefree.gov (http://www.davis-sullivan.com/) provides free, accurate, evidence-based information and professional assistance to help support the immediate and long-term needs of people trying to quit smoking. Document Released: 11/08/2001 Document Re-Released: 05/04/2010 [facility] Patient Information 2011 [place], [place].Back Pain & Injury Your back pain is most likely caused by a strain of the muscles or ligaments supporting the spine. Back strains cause pain and trouble moving because of muscle spasms. They may take  several weeks to heal. Usually they are better in days.  Treatment for back pain includes:  Rest - Get bed rest as needed over the next day or two. Use a firm mattress and lie on your side with your knees slightly bent. If you lie on your back, put a pillow under your knees.   Early movement - Back pain improves most rapidly if you remain active. It is much more stressful on the back to sit or stand in one place. Do not sit, drive or stand in one place for more than 30 minutes at a time. Take short walks on level surfaces as soon as pain allows.   Limit bending and lifting - Do not bend over or lift anything over 20 pounds until instructed otherwise. Lift by bending your knees. Use your leg muscles to help. Keep the load close to your body and avoid twisting. Do not reach or do overhead work.   Medicines - Medicine to reduce pain and inflammation are helpful. Muscle-relaxing drugs may be prescribed.   Therapy - Put ice packs on your back every few hours for the first 2-3 days after your injury or as instructed. After that ice or heat may be alternated to reduce pain and spasm. Back exercises and gentle massage may be of some benefit. You should be examined again if your back pain is not better in one week.  SEEK IMMEDIATE MEDICAL CARE IF:  You have pain that radiates from your back into your legs.   You develop new bowel or bladder control  problems.   You have unusual weakness or numbness in your arms or legs.   You develop nausea or vomiting.   You develop abdominal pain.   You feel faint.  Document Released: 11/14/2005 Document Re-Released: 08/23/2008 [facility] Patient Information 2011 [place], [place].

## 2011-05-05 ENCOUNTER — Encounter: Payer: Self-pay | Admitting: Family Medicine

## 2011-05-05 DIAGNOSIS — Z87891 Personal history of nicotine dependence: Secondary | ICD-10-CM | POA: Insufficient documentation

## 2011-05-05 DIAGNOSIS — Z720 Tobacco use: Secondary | ICD-10-CM

## 2011-05-05 HISTORY — DX: Personal history of nicotine dependence: Z87.891

## 2011-05-05 HISTORY — DX: Tobacco use: Z72.0

## 2011-05-05 NOTE — Assessment & Plan Note (Signed)
Patient encouraged to attempt complete cessation counseled for greater than 3 minutes regarding the need to quit

## 2011-05-05 NOTE — Assessment & Plan Note (Addendum)
Patient has been having trouble with left hip pain with intermittent radicular symptoms downt he left leg. She will experience pain down the left leg and with some numbness into her 4th and 5th toe at times. Will check an xray, she will try Celebrex daily and Flexeril qhs prn, moist heat and gentle stretching.

## 2011-05-05 NOTE — Assessment & Plan Note (Signed)
Patient will now maintain annual MGMs.

## 2011-05-05 NOTE — Progress Notes (Signed)
[name] [id number] 11-09-1951 05/05/2011      Progress Note-Follow Up  Subjective  Chief Complaint  Chief Complaint  Patient presents with  . Establish Care    new patient    HPI  Patient is a 60 yo Caucasian female in for a new patient appt. She has been struggling with some posterior left hip pain off and on for the past 5-6 months. She denies any falls or trauma. Some postitions are worse than others and the pain is the worst with stiffness in the am. No incontinence. Lying flat or on her hip is OK. Sometimes the pain radiates down the back of her leg and into her toes. The 4th and 5th toes can be numb at times. She denies any similar symptoms in the past.  She also notes  She is struggling with fatigue. She denies any CP/palp/SOB/fevers/chills/GI or GU c/o.  She has a history of right sided breast cancer at age 5. She underwent right lumpectomy and LN dissection, no lymph nodes were positive. She also underwent chemo and radiation. She had a colonoscopy last year for the fist time which had 3 polyps, all benign. She has been told to repeat one in 3 years  Past Medical History  Diagnosis Date  . Cancer 1998  . Chicken pox   . Left hip pain 05/04/2011  . Colon polyps   . Dyslipidemia     Past Surgical History  Procedure Date  . Breast lumpectomy 1998    right  . Appendectomy 2010    Family History  Problem Relation Age of Onset  . Diabetes Mother     type 2  . Obesity Mother   . Other Mother   . Cancer Father     ?  Marland Kitchen Other Maternal Grandmother     heart palpitations  . Other Paternal Grandmother     heart arrythmia/ palpitations    History   Social History  . Marital Status: Married    Spouse Name: N/A    Number of Children: N/A  . Years of Education: N/A   Occupational History  . Not on file.   Social History Main Topics  . Smoking status: Current Everyday Smoker -- 10.0 packs/day for 30 years    Types: Cigarettes  . Smokeless tobacco: Never Used   . Alcohol Use: Yes     occasionally  . Drug Use: No  . Sexually Active: No   Other Topics Concern  . Not on file   Social History Narrative  . No narrative on file    No current outpatient prescriptions on file prior to visit.    No Known Allergies  Review of Systems  Review of Systems  Constitutional: Negative for fever and malaise/fatigue.  HENT: Negative for congestion.   Eyes: Negative for discharge.  Respiratory: Negative for shortness of breath.   Cardiovascular: Negative for chest pain, palpitations and leg swelling.  Gastrointestinal: Positive for abdominal pain. Negative for heartburn, nausea, diarrhea and constipation.  Genitourinary: Negative for dysuria, urgency, frequency and hematuria.  Musculoskeletal: Positive for back pain and joint pain. Negative for falls.       [Left hip pain, left lower extremity radicular symptoms at times. No incontinence Skin: Negative for rash.  Neurological: Negative for loss of consciousness, weakness and headaches.  Endo/Heme/Allergies: Negative for polydipsia.  Psychiatric/Behavioral: Negative for depression and suicidal ideas. The patient is not nervous/anxious and does not have insomnia.     Objective  BP 117/77  Pulse 79  Temp(Src) 98 F (36.7 C) (Oral)  Ht 5' 4.75" (1.645 m)  Wt 154 lb 12.8 oz (70.217 kg)  BMI 25.96 kg/m2  SpO2 99%  Physical Exam  Physical Exam  Constitutional: She is oriented to person, place, and time and well-developed, well-nourished, and in no distress. No distress.  HENT:  Head: Normocephalic and atraumatic.  Right Ear: External ear normal.  Left Ear: External ear normal.  Nose: Nose normal.  Mouth/Throat: Oropharynx is clear and moist. No oropharyngeal exudate.  Eyes: Conjunctivae are normal. Pupils are equal, round, and reactive to light. Right eye exhibits no discharge. Left eye exhibits no discharge. No scleral icterus.  Neck: Normal range of motion. Neck supple. No thyromegaly  present.  Cardiovascular: Normal rate, regular rhythm, normal heart sounds and intact distal pulses.   No murmur heard. Pulmonary/Chest: Effort normal and breath sounds normal. No respiratory distress. She has no wheezes. She has no rales.  Abdominal: Soft. Bowel sounds are normal. She exhibits no distension and no mass. There is no tenderness.  Musculoskeletal: Normal range of motion. She exhibits no edema and no tenderness.  Lymphadenopathy:    She has no cervical adenopathy.  Neurological: She is alert and oriented to person, place, and time. She has normal reflexes. No cranial nerve deficit. Coordination normal.  Skin: Skin is warm and dry. No rash noted. She is not diaphoretic.  Psychiatric: Mood, memory and affect normal.        Assessment & Plan  Left hip pain Patient has been having trouble with left hip pain with intermittent radicular symptoms downt he left leg. She will experience pain down the left leg and with some numbness into her 4th and 5th toe at times. Will check an xray, she will try Celebrex daily and Flexeril qhs prn, moist heat and gentle stretching.   Breast cancer, right breast Patient will now maintain annual MGMs.   Dyslipidemia Avoid trans fats, check an FLP, start a fish oil cap and a fiber supplement  Tobacco abuse Patient encouraged to attempt complete cessation counseled for greater than 3 minutes regarding the need to quit

## 2011-05-05 NOTE — Assessment & Plan Note (Signed)
Avoid trans fats, check an FLP, start a fish oil cap and a fiber supplement

## 2011-05-10 ENCOUNTER — Other Ambulatory Visit: Payer: Self-pay

## 2011-05-10 ENCOUNTER — Other Ambulatory Visit (INDEPENDENT_AMBULATORY_CARE_PROVIDER_SITE_OTHER): Payer: 59

## 2011-05-10 ENCOUNTER — Other Ambulatory Visit: Payer: Self-pay | Admitting: Family Medicine

## 2011-05-10 DIAGNOSIS — R5383 Other fatigue: Secondary | ICD-10-CM

## 2011-05-10 DIAGNOSIS — E785 Hyperlipidemia, unspecified: Secondary | ICD-10-CM

## 2011-05-10 DIAGNOSIS — C50919 Malignant neoplasm of unspecified site of unspecified female breast: Secondary | ICD-10-CM

## 2011-05-10 DIAGNOSIS — R5381 Other malaise: Secondary | ICD-10-CM

## 2011-05-10 DIAGNOSIS — C50911 Malignant neoplasm of unspecified site of right female breast: Secondary | ICD-10-CM

## 2011-05-10 LAB — HEPATIC FUNCTION PANEL
Alkaline Phosphatase: 67 U/L (ref 39–117)
Bilirubin, Direct: 0.1 mg/dL (ref 0.0–0.3)
Total Bilirubin: 0.5 mg/dL (ref 0.3–1.2)
Total Protein: 7.1 g/dL (ref 6.0–8.3)

## 2011-05-10 LAB — CBC WITH DIFFERENTIAL/PLATELET
Basophils Relative: 0.5 % (ref 0.0–3.0)
Eosinophils Absolute: 0.1 10*3/uL (ref 0.0–0.7)
Eosinophils Relative: 1.5 % (ref 0.0–5.0)
Lymphocytes Relative: 23.6 % (ref 12.0–46.0)
MCHC: 33.6 g/dL (ref 30.0–36.0)
Neutrophils Relative %: 69.4 % (ref 43.0–77.0)
RBC: 4.12 Mil/uL (ref 3.87–5.11)
WBC: 6.6 10*3/uL (ref 4.5–10.5)

## 2011-05-10 LAB — RENAL FUNCTION PANEL
Albumin: 4 g/dL (ref 3.5–5.2)
Chloride: 106 mEq/L (ref 96–112)
GFR: 130.69 mL/min (ref >60.00)
Phosphorus: 4 mg/dL (ref 2.3–4.6)
Potassium: 4.4 mEq/L (ref 3.5–5.1)
Sodium: 138 mEq/L (ref 135–145)

## 2011-05-10 LAB — LIPID PANEL
HDL: 45.8 mg/dL (ref >39.00)
Total CHOL/HDL Ratio: 4
VLDL: 18.8 mg/dL (ref 0.0–40.0)

## 2011-05-11 ENCOUNTER — Other Ambulatory Visit: Payer: Self-pay | Admitting: Radiology

## 2011-05-11 DIAGNOSIS — C50911 Malignant neoplasm of unspecified site of right female breast: Secondary | ICD-10-CM

## 2011-05-13 ENCOUNTER — Telehealth: Payer: Self-pay | Admitting: Family Medicine

## 2011-05-13 LAB — VITAMIN D 1,25 DIHYDROXY
Vitamin D 1, 25 (OH)2 Total: 49 pg/mL (ref 18–72)
Vitamin D3 1, 25 (OH)2: 49 pg/mL

## 2011-05-13 NOTE — Telephone Encounter (Signed)
Called and left message on home answering machine for patient to call us if she thinks we can be of any help to her as she manages her recurrent breast cancer diagnosis.

## 2011-05-15 ENCOUNTER — Ambulatory Visit
Admission: RE | Admit: 2011-05-15 | Discharge: 2011-05-15 | Disposition: A | Discharge status: Home / Self Care | Payer: 59 | Source: Ambulatory Visit | Attending: Radiology | Admitting: Radiology

## 2011-05-15 DIAGNOSIS — C50911 Malignant neoplasm of unspecified site of right female breast: Secondary | ICD-10-CM

## 2011-05-15 MED ORDER — GADOBENATE DIMEGLUMINE 529 MG/ML IV SOLN
14.0000 mL | Freq: Once | INTRAVENOUS | Status: AC | PRN
  Administered 2011-05-15: 14 mL via INTRAVENOUS

## 2011-05-16 ENCOUNTER — Other Ambulatory Visit: Payer: Self-pay | Admitting: Obstetrics and Gynecology

## 2011-05-16 ENCOUNTER — Encounter (INDEPENDENT_AMBULATORY_CARE_PROVIDER_SITE_OTHER): Payer: 59 | Admitting: Obstetrics and Gynecology

## 2011-05-16 ENCOUNTER — Other Ambulatory Visit (HOSPITAL_COMMUNITY)
Admission: RE | Admit: 2011-05-16 | Discharge: 2011-05-16 | Disposition: A | Discharge status: Home / Self Care | Payer: 59 | Source: Ambulatory Visit | Attending: Obstetrics and Gynecology | Admitting: Obstetrics and Gynecology

## 2011-05-16 DIAGNOSIS — Z01419 Encounter for gynecological examination (general) (routine) without abnormal findings: Secondary | ICD-10-CM

## 2011-05-16 DIAGNOSIS — R82998 Other abnormal findings in urine: Secondary | ICD-10-CM

## 2011-05-16 DIAGNOSIS — Z124 Encounter for screening for malignant neoplasm of cervix: Secondary | ICD-10-CM | POA: Insufficient documentation

## 2011-05-18 ENCOUNTER — Encounter (HOSPITAL_BASED_OUTPATIENT_CLINIC_OR_DEPARTMENT_OTHER): Payer: 59 | Admitting: Oncology

## 2011-05-18 ENCOUNTER — Other Ambulatory Visit: Payer: Self-pay | Admitting: Oncology

## 2011-05-18 DIAGNOSIS — Z853 Personal history of malignant neoplasm of breast: Secondary | ICD-10-CM

## 2011-05-18 DIAGNOSIS — Z170 Estrogen receptor positive status [ER+]: Secondary | ICD-10-CM

## 2011-05-18 DIAGNOSIS — C50919 Malignant neoplasm of unspecified site of unspecified female breast: Secondary | ICD-10-CM

## 2011-05-18 LAB — CBC WITH DIFFERENTIAL/PLATELET
Basophils Absolute: 0 10*3/uL (ref 0.0–0.1)
Eosinophils Absolute: 0.1 10*3/uL (ref 0.0–0.5)
HGB: 13.2 g/dL (ref 11.6–15.9)
MCV: 90.1 fL (ref 79.5–101.0)
MONO#: 0.4 10*3/uL (ref 0.1–0.9)
MONO%: 6.3 % (ref 0.0–14.0)
NEUT#: 4.5 10*3/uL (ref 1.5–6.5)
Platelets: 283 10*3/uL (ref 145–400)
RDW: 13.3 % (ref 11.2–14.5)
WBC: 6.7 10*3/uL (ref 3.9–10.3)

## 2011-05-18 LAB — COMPREHENSIVE METABOLIC PANEL
Alkaline Phosphatase: 71 U/L (ref 39–117)
BUN: 15 mg/dL (ref 6–23)
CO2: 27 mEq/L (ref 19–32)
Glucose, Bld: 106 mg/dL — ABNORMAL HIGH (ref 70–99)
Total Bilirubin: 0.3 mg/dL (ref 0.3–1.2)

## 2011-05-18 LAB — CANCER ANTIGEN 27.29: CA 27.29: 28 U/mL (ref 0–39)

## 2011-05-23 ENCOUNTER — Encounter: Payer: 59 | Admitting: Genetic Counselor

## 2011-05-23 ENCOUNTER — Inpatient Hospital Stay (HOSPITAL_COMMUNITY): Admission: RE | Admit: 2011-05-23 | Payer: 59 | Source: Ambulatory Visit

## 2011-05-27 ENCOUNTER — Encounter (INDEPENDENT_AMBULATORY_CARE_PROVIDER_SITE_OTHER): Payer: Self-pay | Admitting: General Surgery

## 2011-05-30 ENCOUNTER — Ambulatory Visit (INDEPENDENT_AMBULATORY_CARE_PROVIDER_SITE_OTHER): Payer: 59 | Admitting: General Surgery

## 2011-05-30 VITALS — Temp 96.5°F | Wt 154.2 lb

## 2011-05-30 DIAGNOSIS — C50919 Malignant neoplasm of unspecified site of unspecified female breast: Secondary | ICD-10-CM

## 2011-05-30 DIAGNOSIS — C50911 Malignant neoplasm of unspecified site of right female breast: Secondary | ICD-10-CM

## 2011-05-30 NOTE — Progress Notes (Signed)
History of present illness The patient is a 60 year old white female who has had a previous right lumpectomy for breast cancer. She was treated with chemotherapy and radiation. She presents now with a new 1.6 cm cancer in her right breast. 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 She has met with plastic surgery. There plan is to reconstruct her with a latissimus flap and possible implant. She is planning to have genetic testing done but this will not be back for several weeks.  Physical exam Unchanged.  Assesment and plan Our plan is for right mastectomy with sentinel node biopsy. She will then have reconstruction performed by Dr.  Odis Luster. I have discussed with her in detail the risks and benefits the operation as well some technical aspects. This includes the risk of skin flap necrosis. She understands and wishes to proceed. We will contact Dr. Breck Coons office and coordinate surgery with him.

## 2011-06-02 ENCOUNTER — Other Ambulatory Visit (INDEPENDENT_AMBULATORY_CARE_PROVIDER_SITE_OTHER): Payer: Self-pay | Admitting: General Surgery

## 2011-06-02 DIAGNOSIS — C50911 Malignant neoplasm of unspecified site of right female breast: Secondary | ICD-10-CM

## 2011-06-06 ENCOUNTER — Encounter: Payer: 59 | Admitting: Genetic Counselor

## 2011-06-07 ENCOUNTER — Encounter (HOSPITAL_COMMUNITY)
Admission: RE | Admit: 2011-06-07 | Discharge: 2011-06-07 | Disposition: A | Discharge status: Home / Self Care | Payer: 59 | Source: Ambulatory Visit | Attending: Oncology | Admitting: Oncology

## 2011-06-07 ENCOUNTER — Ambulatory Visit (HOSPITAL_COMMUNITY)
Admission: RE | Admit: 2011-06-07 | Discharge: 2011-06-07 | Disposition: A | Discharge status: Home / Self Care | Payer: 59 | Source: Ambulatory Visit | Attending: Oncology | Admitting: Oncology

## 2011-06-07 ENCOUNTER — Encounter (HOSPITAL_COMMUNITY): Payer: Self-pay

## 2011-06-07 DIAGNOSIS — C50919 Malignant neoplasm of unspecified site of unspecified female breast: Secondary | ICD-10-CM

## 2011-06-07 MED ORDER — TECHNETIUM TC 99M MEDRONATE IV KIT
24.0000 | PACK | Freq: Once | INTRAVENOUS | Status: AC | PRN
  Administered 2011-06-07: 24 via INTRAVENOUS

## 2011-06-27 ENCOUNTER — Other Ambulatory Visit (HOSPITAL_COMMUNITY): Payer: 59

## 2011-06-29 ENCOUNTER — Other Ambulatory Visit (INDEPENDENT_AMBULATORY_CARE_PROVIDER_SITE_OTHER): Payer: Self-pay | Admitting: General Surgery

## 2011-06-29 ENCOUNTER — Ambulatory Visit (HOSPITAL_COMMUNITY)
Admission: RE | Admit: 2011-06-29 | Discharge: 2011-06-29 | Disposition: A | Discharge status: Home / Self Care | Payer: 59 | Source: Ambulatory Visit | Attending: General Surgery | Admitting: General Surgery

## 2011-06-29 ENCOUNTER — Encounter (HOSPITAL_COMMUNITY)
Admission: RE | Admit: 2011-06-29 | Discharge: 2011-06-29 | Disposition: A | Discharge status: Home / Self Care | Payer: 59 | Source: Ambulatory Visit | Attending: General Surgery | Admitting: General Surgery

## 2011-06-29 DIAGNOSIS — C50911 Malignant neoplasm of unspecified site of right female breast: Secondary | ICD-10-CM

## 2011-06-29 DIAGNOSIS — Z01812 Encounter for preprocedural laboratory examination: Secondary | ICD-10-CM | POA: Insufficient documentation

## 2011-06-29 DIAGNOSIS — C50919 Malignant neoplasm of unspecified site of unspecified female breast: Secondary | ICD-10-CM | POA: Insufficient documentation

## 2011-06-29 DIAGNOSIS — I517 Cardiomegaly: Secondary | ICD-10-CM | POA: Insufficient documentation

## 2011-06-29 DIAGNOSIS — Z01818 Encounter for other preprocedural examination: Secondary | ICD-10-CM | POA: Insufficient documentation

## 2011-06-29 LAB — CBC
MCH: 29.5 pg (ref 26.0–34.0)
MCHC: 32.8 g/dL (ref 30.0–36.0)
MCV: 89.8 fL (ref 78.0–100.0)
Platelets: 299 10*3/uL (ref 150–400)
RDW: 13.4 % (ref 11.5–15.5)

## 2011-06-29 LAB — BASIC METABOLIC PANEL
CO2: 31 mEq/L (ref 19–32)
Calcium: 10.2 mg/dL (ref 8.4–10.5)
Creatinine, Ser: 0.6 mg/dL (ref 0.50–1.10)
GFR calc Af Amer: >60 mL/min (ref >60)

## 2011-06-29 LAB — SURGICAL PCR SCREEN: MRSA, PCR: NEGATIVE

## 2011-07-08 ENCOUNTER — Ambulatory Visit (HOSPITAL_COMMUNITY)
Admission: RE | Admit: 2011-07-08 | Discharge: 2011-07-08 | Disposition: A | Discharge status: Home / Self Care | Payer: 59 | Source: Ambulatory Visit | Attending: General Surgery | Admitting: General Surgery

## 2011-07-08 ENCOUNTER — Other Ambulatory Visit (INDEPENDENT_AMBULATORY_CARE_PROVIDER_SITE_OTHER): Payer: Self-pay | Admitting: General Surgery

## 2011-07-08 ENCOUNTER — Ambulatory Visit (HOSPITAL_COMMUNITY)
Admission: RE | Admit: 2011-07-08 | Discharge: 2011-07-10 | Disposition: A | Discharge status: Home / Self Care | Payer: 59 | Source: Ambulatory Visit | Attending: General Surgery | Admitting: General Surgery

## 2011-07-08 DIAGNOSIS — Z01812 Encounter for preprocedural laboratory examination: Secondary | ICD-10-CM | POA: Insufficient documentation

## 2011-07-08 DIAGNOSIS — Z01818 Encounter for other preprocedural examination: Secondary | ICD-10-CM | POA: Insufficient documentation

## 2011-07-08 DIAGNOSIS — Z923 Personal history of irradiation: Secondary | ICD-10-CM | POA: Insufficient documentation

## 2011-07-08 DIAGNOSIS — C50919 Malignant neoplasm of unspecified site of unspecified female breast: Secondary | ICD-10-CM | POA: Insufficient documentation

## 2011-07-08 DIAGNOSIS — C50911 Malignant neoplasm of unspecified site of right female breast: Secondary | ICD-10-CM

## 2011-07-08 DIAGNOSIS — Z01810 Encounter for preprocedural cardiovascular examination: Secondary | ICD-10-CM | POA: Insufficient documentation

## 2011-07-08 HISTORY — PX: BREAST SURGERY: SHX581

## 2011-07-08 MED ORDER — TECHNETIUM TC 99M SULFUR COLLOID FILTERED
1.0000 | Freq: Once | INTRAVENOUS | Status: AC | PRN
  Administered 2011-07-08: 1 via INTRADERMAL

## 2011-07-13 NOTE — Op Note (Signed)
[name], [name] NO.:  [id number]  MEDICAL RECORD NO.:  [id number]  LOCATION:  5128                         FACILITY:  [facility]  PHYSICIAN:  Etter Sjogren, M.D.     DATE OF BIRTH:  [birth year]  DATE OF PROCEDURE:  07/08/2011 DATE OF DISCHARGE:                              OPERATIVE REPORT   PREOPERATIVE DIAGNOSIS:  Right breast cancer.  POSTOPERATIVE DIAGNOSIS:  Right breast cancer.  PROCEDURES PERFORMED: 1. Right latissimus myocutaneous flap. 2. Distinct procedure, right breast reconstruction with saline     implant.  SURGEON:  Etter Sjogren, MD  ASSISTANT:  Pleas Patricia, MD  ANESTHESIA:  General.  ESTIMATED BLOOD LOSS:  75 mL.  DRAINS:  19-French drains x4 (two in the back donor site and two anterior chest).  CLINICAL NOTE:  A 60 year old woman has had breast cancer, has had lumpectomy and radiation some years ago and now presents with a new breast cancer.  She has already had lumpectomy and radiation and she is to have mastectomy and she is interested in reconstruction and specifically desired immediate reconstruction.  Options were discussed. Given her radiation latissimus flap with implant and TRAM flap discussed, she selected a latissimus flap with implant.  The procedure risks and possible complications were discussed with her in detail and she understood those risks, which included but were not limited to bleeding, infection, anesthesia complications, healing problems, scarring, loss of sensation, loss of fluid accumulations, asymmetry, failure of the implant, capsular contracture, malposition of device, wrinkles, ripples, disappointment, and loss of tissue, loss of skin flap where the mastectomy was performed, loss of portions were all of the latissimus flap, pulmonary embolism and deficits from transfer of the latissimus muscle and she understood all of this and wished to proceed.  DESCRIPTION OF PROCEDURE:  The patient was taken to  the operating room and the mastectomy being completed.  The mastectomy wound was stapled closed over some moist flaps and she was then placed on the left lateral decubitus position and actually roll positioned and heading positioned appropriately and beanbag inflated and then she was prepped with ChloraPrep for back and Betadine over the mastectomy site.  The mastectomy site for this turning process was covered with a sterile OpSite and that sterile OpSite was removed when there is time for the prep.  The patient was then draped with sterile drapes.  The incision was made for the latissimus flap and the dissection carried down through the subcutaneous tissue beveling superiorly and inferiorly in order to leave a broader attachment of soft tissue, then skin in order to ensure a blood supply to the skin paddle.  The muscle border was identified, the muscle was dissected free and released inferiorly and posteriorly and then as the muscle was reflected from inferior to superior, perforating blood vessels were identified and were either triple ligated with Ligaclips and divided or suture ligated with 3-0 Vicryl sutures. The flap having been raised to the axilla.  Great care being taken to avoid damage to the pedicle.  The flap was then passed through the tunnel to the chest.  The laps were removed and the flap was then placed in the space, which still continued to  be stapled closed.  The flap had excellent color and bright red bleeding around its periphery consistent with viability.  The chest was irrigated thoroughly with saline and meticulous hemostasis was achieved using electrocautery.  Two 19-French drains were positioned and brought out through separate stab wound infero-anteriorly and secured with 3-0 Prolene sutures.  The skin closure after ensuring excellent hemostasis was performed with 0 PDS interrupted inverted deep sutures, 3-0 Monocryl interrupted inverted deep dermal sutures,  running 3-0 Monocryl subcuticular suture and Dermabond.  After waiting for the Dermabond to dried, dry sterile dressing was placed and Tegaderm was then positioned over this to secure it and sterile dressing and sterile Tegaderm placed over the mastectomy incision that was still staple closed.  The drains were dressed with Bio-Spot dressings and sterile Tegaderm.  The patient was then turned to a supine position.  Again, the sterile Tegaderm that had been placed over the mastectomy site was then removed and she was prepped with Betadine and draped with the clear 1000 drapes and sterile drapes.  Clear 1000 drapes that also been used for her back surgery as well.  The skin staples were removed and the underlying flap was then inspected.  It was found to be an excellent condition, nice color, good color, viable with bright red bleeding around its periphery consistent with viability.  Thorough irrigation with saline, irrigation with antibiotic solution and the flap insetting was begun with 3-0 Vicryl interrupted simple and horizontal mattress sutures.  Care again taken to avoid damage to the pedicle to the muscle as the incision was performed.  Once all of the muscle had been inset with the exception of the superior border for access with the implant, the 19-French drains were positioned and brought out through separate stab wounds inferolaterally and secured with 3-0 Prolene sutures.  When it was placed into the axilla, where the axillary dissection had been performed and the other underneath the mastectomy flaps.  This should be noted that there was found an ischemic blister on the skin at the inframammary crease.  This appeared to be from Bovie for the mastectomy.  There was a very limited area and the remainder of the skin had excellent color for the superior and inferior mastectomy flaps and appeared to be viable. This wound site was very small approximately 1-1.5 cm and it was  felt that there was very good latissimus muscle underneath this site once the muscle insetting had been performed.  After thoroughly cleaned gloves, the implant was prepared, this was a Dance movement psychotherapist, smooth, round saline implant, the 425 plus 50 maximum fill 475 mL implant.  A total of 100 mL sterile saline placed using a closed filling system and the implant was return to the antibiotic solution and continued to soak there for greater than 5 minutes.  Antibiotic solution was also placed in the space and allowed it to dwell there as well.  The implant was then positioned filled with a maximum 475 mL, this seem to give her very good strength to the opposite side.  Her breast specimen had weighed 507 g. It would been a little bit smaller than the opposite breast because of her previous lumpectomy and radiation.  Again, antibiotic solution was placed into the muscle closure superiorly with 3-0 Vicryl interrupted horizontal mattress sutures.  Great care taken to avoid damage underlying the implant, which was kept under direct vision at all times.  This could created full submuscular coverage and great care had been taken to make sure  that there was a very good closure laterally in order to prevent the implant from slipping laterally and posteriorly.  Thorough irrigation again with saline antibiotic solution in the skin closure with 3-0 Monocryl inverted deep dermal sutures.  The very corner of the flap both medial and lateral was removed.  It was needed for the closure and they wanted to do this to check the viability of the flap and there was bright red bleeding when this skin was trimmed consistent with viability.  The insetting was completed with an application of Dermabond after the 3-0 Monocryl interrupted inverted deep dermal sutures and the Bio-Spot dressings were again placed all around the drains, sterile Tegaderm was used to secure it.  She was transferred to the recovery room in stable  having tolerated the procedure well.  The flap continued to have excellent color and a deep bright red blood both medial and lateral where the skin was removed prior to the final insetting.     Etter Sjogren, M.D.     DB/MEDQ  D:  07/08/2011  T:  07/08/2011  Job:  [id number]  Electronically Signed by Etter Sjogren M.D. on 07/13/2011 02:58:59 PM

## 2011-07-13 NOTE — Discharge Summary (Signed)
NAMEMarland Mitchell  [name], [name] NO.:  [id number]  MEDICAL RECORD NO.:  [id number]  LOCATION:  5128                         FACILITY:  [facility]  PHYSICIAN:  Etter Sjogren, M.D.     DATE OF BIRTH:  11-Sep-1951  DATE OF ADMISSION:  07/08/2011 DATE OF DISCHARGE:  07/10/2011                              DISCHARGE SUMMARY   FINAL DIAGNOSIS:  Right breast cancer.  PROCEDURES PERFORMED: 1. Right modified radical mastectomy with sentinel lymph node mapping. 2. Right latissimus myocutaneous flap. 3. Breast reconstruction with a saline implant, right side.  SUMMARY OF HISTORY OF PRESENT ILLNESS:  A 60 year old woman who has had breast cancer had lumpectomy and radiation some years ago and now has developed a recurrence.  She presents for mastectomy and is interested in reconstruction due to previous radiation.  Options were discussed that included the use of autogenous tissue and she selected latissimus flap with implant.  She preferred saline implants as opposed to silicone gel.  The nature of the procedure, the risks plus complications were discussed with her in detail and she understood those and wished to proceed.  COURSE IN THE HOSPITAL:  On admission, she was taken to the operating room at which time the right modified radical mastectomy and breast reconstruction with latissimus flap and implant were performed.  She tolerated this procedure well.  Postoperatively, she did very well.  She did have some initial nausea that responded to changing from the morphine PCA pump to p.o. Dilaudid.  She was ambulating.  Her nausea resolved.  Pain control was initially on the first postoperative day; and by the seventh postoperative day, it had improved dramatically.  It responded to Robaxin 500 mg p.o. q.6 h. and Dilaudid 2-4 mg p.o. every 3- 4 hours.  The flap had excellent color and viable mastectomy flaps also with good color and viable donor site findings.  There was no evidence of  any hematoma or infection at either donor recipient site and the drains were functioning with very thin drainage.  She would like to go home this afternoon and it is felt that she is ready to be discharged.  DISPOSITION:  No lifting, no vigorous activities, no raising her right arm overhead, no pulling with her right arm, no shower yet.  If she drains three times a day, then record amounts.  Use the incentive spirometer at least 10 times a day while at home.  She will keep a small dressing on her back in order to avoid trauma to the donor site.  DISCHARGE MEDICATIONS: 1. Dilaudid 2-4 mg p.o. q.4 h. p.r.n. for pain. 2. Robaxin 500 mg one p.o. q.6 h. p.r.n. muscle spasm. 3. Keflex 500 mg one p.o. t.i.d. for 3 days, then b.i.d. 4. Phenergan 25 mg one p.o. q.6 h. p.r.n. nausea and vomiting. 5. I recommend that she takes Colace 100 mg p.o. b.i.d. to try to     avoid constipation.  I will see her back in the office end of this week for recheck.     Etter Sjogren, M.D.     DB/MEDQ  D:  07/10/2011  T:  07/10/2011  Job:  [id number] Electronically Signed by Etter Sjogren M.D. on 07/13/2011  02:59:06 PM

## 2011-07-26 NOTE — Op Note (Signed)
NAME:  [name], [name] NO.:  [id number]  MEDICAL RECORD NO.:  [id number]  LOCATION:  SDSC                         FACILITY:  [facility]  PHYSICIAN:  Ollen Gross. Vernell Morgans, M.D. DATE OF BIRTH:  04/24/1951  DATE OF PROCEDURE:  07/08/2011 DATE OF DISCHARGE:                              OPERATIVE REPORT   PREOPERATIVE DIAGNOSIS:  Recurrent right breast cancer.  POSTOPERATIVE DIAGNOSIS:  Recurrent right breast cancer.  PROCEDURE:  Right mastectomy and sentinel lymph node biopsy with injection of blue dye and subsequent axillary lymph node dissection.  SURGEON:  Ollen Gross. Vernell Morgans, MD  ASSISTANT:  Dr. Derrell Lolling.  ANESTHESIA:  General endotracheal.  PROCEDURE:  After informed consent was obtained, the patient was brought to the operating room, placed in supine position on the operating room table.  After adequate induction of general anesthesia, the patient's chest, breast, axillary area bilaterally was all prepped with ChloraPrep, allowed to dry and draped in usual sterile manner.  Earlier the day, the patient undergone injection of 1 mCi of technetium sulfur colloid in the subareolar position.  At this point, 2 mL of methylene blue and 3 mL of injectable saline were also injected in subareolar position on the right breast.  The breast was incised for several minutes.  An elliptical incision was mapped out around the nipple- areolar complex.  The incision was then made along the mapped outlines with a 10 blade knife.  This incision was carried down through the skin and subcutaneous tissue sharply with electrocautery.  Once into the breast tissue, breast hooks were used to elevate the skin flaps anteriorly towards the ceiling and thin skin flaps were created circumferentially around the wound until the dissection was carried down all the way to the chest wall superiorly, medially and inferiorly. Laterally, the dissection was carried all the way down to the latissimus muscle.   Once this was accomplished, we then used the NeoProbe to identify the hot spot in the right axilla using sharp Bovie dissection. We were able to identify a hot lymph node and was excised sharply with the electrocautery and sent to pathology for further evaluation.  While we were waiting on this, we then removed the breast from the chest wall with the pectoralis fascia.  This was also done sharply with electrocautery.  The breast once it was removed was marked with a stitch on the lateral aspect and sent to pathology.  At this point, Pathology called back and said that her sentinel lymph node was positive for cancer, so at this point we were able to identify the serratus muscle along the chest wall medially, the latissimus muscle laterally and then using the blunt right angle dissection and some sharp dissection with electrocautery, we identified the right axillary vein superiorly, all the lymphatic tissue within these boundaries was teased away, mostly blunt right angle dissection and some sharp dissection with electrocautery.  Some small vessels were controlled with Delica clip. Once the contents of the axilla within these boundaries was removed, it was sent to pathology as right axillary contents.  The wound was then examined and found to be hemostatic.  Wound was irrigated with copious amounts of saline, packed with moistened  lap sponges. At this point, the operation was turned over to Dr. Odis Luster, the patient was in stable condition.  Dr. Odis Luster will perform the reconstruction. His portion will be dictated separately.  At the end of this portion case, all needle, sponge counts correct.     Ollen Gross. Vernell Morgans, M.D.     PST/MEDQ  D:  07/08/2011  T:  07/08/2011  Job:  [id number]  Electronically Signed by Chevis Pretty III M.D. on 07/26/2011 07:56:09 AM

## 2011-08-02 ENCOUNTER — Encounter: Payer: 59 | Admitting: Oncology

## 2011-08-02 ENCOUNTER — Telehealth (INDEPENDENT_AMBULATORY_CARE_PROVIDER_SITE_OTHER): Payer: Self-pay | Admitting: General Surgery

## 2011-08-02 NOTE — Telephone Encounter (Signed)
Pt called to sched a reck appt for this week, she will be in the area today and tomorrow, please call to sched.

## 2011-08-03 ENCOUNTER — Other Ambulatory Visit: Payer: Self-pay | Admitting: Oncology

## 2011-08-03 DIAGNOSIS — Z853 Personal history of malignant neoplasm of breast: Secondary | ICD-10-CM

## 2011-08-09 ENCOUNTER — Encounter (HOSPITAL_BASED_OUTPATIENT_CLINIC_OR_DEPARTMENT_OTHER): Payer: 59 | Admitting: Oncology

## 2011-08-09 DIAGNOSIS — M810 Age-related osteoporosis without current pathological fracture: Secondary | ICD-10-CM

## 2011-08-11 ENCOUNTER — Encounter (INDEPENDENT_AMBULATORY_CARE_PROVIDER_SITE_OTHER): Payer: Self-pay | Admitting: General Surgery

## 2011-08-11 ENCOUNTER — Ambulatory Visit (INDEPENDENT_AMBULATORY_CARE_PROVIDER_SITE_OTHER): Payer: 59 | Admitting: General Surgery

## 2011-08-11 DIAGNOSIS — C50911 Malignant neoplasm of unspecified site of right female breast: Secondary | ICD-10-CM

## 2011-08-11 DIAGNOSIS — C50919 Malignant neoplasm of unspecified site of unspecified female breast: Secondary | ICD-10-CM

## 2011-08-11 NOTE — Patient Instructions (Signed)
Activity as dictated by Dr. Odis Luster

## 2011-08-23 ENCOUNTER — Encounter (INDEPENDENT_AMBULATORY_CARE_PROVIDER_SITE_OTHER): Payer: Self-pay | Admitting: General Surgery

## 2011-08-23 NOTE — Progress Notes (Signed)
Subjective:     Patient ID: [name], female   DOB: [birth year], 60 y.o.   MRN: [id number]  HPI The patient is a 60 year old white female who is now about a month out from a right mastectomy and axillary lymph node dissection for a T1 C. N1 A. breast cancer. She has had some soreness along the chest wall. She also had a reconstruction done at the same time. Otherwise though she has no complaints today. Review of Systems     Objective:   Physical Exam On exam her mastectomy and reconstruction incisions look good. There is no sign of infection or seroma.   Assessment:     One month postop from a right mastectomy and axillary node dissection with immediate reconstruction    Plan:     At this point she will continue to refrain from any strenuous or overhead activity. She will continue close follow up with her plastic surgeon. We will plan to see her back in another month to check on her progress. She will also continue her followup the medical and radiation oncologist

## 2011-09-06 ENCOUNTER — Telehealth: Payer: Self-pay | Admitting: Family Medicine

## 2011-09-06 MED ORDER — CELECOXIB 200 MG PO CAPS: 200.0000 mg | ORAL_CAPSULE | Freq: Every day | ORAL | Status: DC

## 2011-09-06 NOTE — Telephone Encounter (Signed)
Patient would like to change her celebrex to 200 mg. That will put it in the next tier of her insurance. Please send refill to [facility] in [place]

## 2011-09-06 NOTE — Telephone Encounter (Signed)
Pt would like 90 day supply 

## 2011-09-06 NOTE — Telephone Encounter (Signed)
OK to prescribe Celebrex 200mg  po daily, disp # 30 or if she wants a 90 day supply, can disp #90, with 2 rf

## 2011-09-06 NOTE — Telephone Encounter (Signed)
Please advise 

## 2011-09-13 ENCOUNTER — Encounter (HOSPITAL_BASED_OUTPATIENT_CLINIC_OR_DEPARTMENT_OTHER): Payer: 59 | Admitting: Oncology

## 2011-09-13 ENCOUNTER — Other Ambulatory Visit: Payer: Self-pay | Admitting: Oncology

## 2011-09-13 DIAGNOSIS — M810 Age-related osteoporosis without current pathological fracture: Secondary | ICD-10-CM

## 2011-09-13 DIAGNOSIS — C50419 Malignant neoplasm of upper-outer quadrant of unspecified female breast: Secondary | ICD-10-CM

## 2011-09-13 DIAGNOSIS — Z170 Estrogen receptor positive status [ER+]: Secondary | ICD-10-CM

## 2011-09-13 LAB — COMPREHENSIVE METABOLIC PANEL
CO2: 26 mEq/L (ref 19–32)
Calcium: 9 mg/dL (ref 8.4–10.5)
Chloride: 103 mEq/L (ref 96–112)
Creatinine, Ser: 0.54 mg/dL (ref 0.50–1.10)
Glucose, Bld: 84 mg/dL (ref 70–99)
Total Bilirubin: 0.2 mg/dL — ABNORMAL LOW (ref 0.3–1.2)
Total Protein: 7.2 g/dL (ref 6.0–8.3)

## 2011-09-13 LAB — CBC WITH DIFFERENTIAL/PLATELET
Basophils Absolute: 0 10*3/uL (ref 0.0–0.1)
Eosinophils Absolute: 0.1 10*3/uL (ref 0.0–0.5)
HCT: 37.1 % (ref 34.8–46.6)
HGB: 12.4 g/dL (ref 11.6–15.9)
LYMPH%: 30.6 % (ref 14.0–49.7)
MONO#: 0.4 10*3/uL (ref 0.1–0.9)
NEUT#: 3.8 10*3/uL (ref 1.5–6.5)
NEUT%: 61.5 % (ref 38.4–76.8)
Platelets: 286 10*3/uL (ref 145–400)
WBC: 6.1 10*3/uL (ref 3.9–10.3)

## 2011-09-19 ENCOUNTER — Encounter (INDEPENDENT_AMBULATORY_CARE_PROVIDER_SITE_OTHER): Payer: Self-pay | Admitting: General Surgery

## 2011-09-19 ENCOUNTER — Ambulatory Visit (INDEPENDENT_AMBULATORY_CARE_PROVIDER_SITE_OTHER): Payer: 59 | Admitting: General Surgery

## 2011-09-19 VITALS — BP 126/84 | HR 68 | Temp 97.2°F | Resp 16 | Ht 67.0 in | Wt 160.0 lb

## 2011-09-19 DIAGNOSIS — C50911 Malignant neoplasm of unspecified site of right female breast: Secondary | ICD-10-CM

## 2011-09-19 DIAGNOSIS — C50919 Malignant neoplasm of unspecified site of unspecified female breast: Secondary | ICD-10-CM

## 2011-09-19 NOTE — Patient Instructions (Signed)
Continue regular self exams Continue arimidex 

## 2011-09-20 NOTE — Progress Notes (Signed)
Subjective:     Patient ID: [name], female   DOB: Apr 24, 1951, 60 y.o.   MRN: [id number]  HPI The patient is a 60 year old white female who is now about 2 months out from a right mastectomy and axillary node dissection for a right breast cancer. Since her last visit she is actually gone back to work for a couple of days and seems to be tolerating that well. She notes some numbness of the right chest wall. I believe she's also been started on Arimidex.  Review of Systems     Objective:   Physical Exam On exam her right mastectomy incision is healed nicely. Her implant appears intact. She has no palpable mass of the right chest wall. No palpable masses left breast. No axillary supraclavicular or cervical lymphadenopathy.    Assessment:     2 months status post right mastectomy and axillary node dissection    Plan:     At this point she will continue her Arimidex. We will plan to see her back in about 3 months.

## 2011-10-05 ENCOUNTER — Ambulatory Visit: Payer: 59 | Attending: Oncology | Admitting: Physical Therapy

## 2011-10-05 DIAGNOSIS — IMO0001 Reserved for inherently not codable concepts without codable children: Secondary | ICD-10-CM | POA: Insufficient documentation

## 2011-10-05 DIAGNOSIS — M25519 Pain in unspecified shoulder: Secondary | ICD-10-CM | POA: Insufficient documentation

## 2011-10-05 DIAGNOSIS — M24519 Contracture, unspecified shoulder: Secondary | ICD-10-CM | POA: Insufficient documentation

## 2011-10-10 ENCOUNTER — Telehealth: Payer: Self-pay | Admitting: *Deleted

## 2011-10-10 NOTE — Telephone Encounter (Signed)
GAVE PATIENT 'S DAUGHTER THE APPOINTMENT NEW DATE AND TIME OF [phone] WITH CHRISTINE. PATIENT'S DAUGHTER CONFIRMED OVER THE PHONE

## 2011-10-11 ENCOUNTER — Encounter: Payer: 59 | Admitting: Physical Therapy

## 2011-10-13 ENCOUNTER — Ambulatory Visit: Payer: 59 | Admitting: Physical Therapy

## 2011-10-17 ENCOUNTER — Ambulatory Visit: Payer: 59

## 2011-10-25 ENCOUNTER — Ambulatory Visit: Payer: 59 | Admitting: Physical Therapy

## 2011-10-27 ENCOUNTER — Ambulatory Visit: Payer: 59 | Admitting: Physical Therapy

## 2011-11-10 ENCOUNTER — Encounter (INDEPENDENT_AMBULATORY_CARE_PROVIDER_SITE_OTHER): Payer: Self-pay | Admitting: General Surgery

## 2011-11-24 ENCOUNTER — Other Ambulatory Visit: Payer: Self-pay | Admitting: Family Medicine

## 2011-12-08 ENCOUNTER — Encounter (INDEPENDENT_AMBULATORY_CARE_PROVIDER_SITE_OTHER): Payer: 59 | Admitting: General Surgery

## 2011-12-08 ENCOUNTER — Other Ambulatory Visit: Payer: 59

## 2011-12-09 ENCOUNTER — Ambulatory Visit (INDEPENDENT_AMBULATORY_CARE_PROVIDER_SITE_OTHER): Payer: 59 | Admitting: General Surgery

## 2011-12-09 VITALS — BP 120/80 | HR 72 | Temp 98.2°F | Resp 12 | Ht 66.0 in | Wt 155.0 lb

## 2011-12-09 DIAGNOSIS — C50911 Malignant neoplasm of unspecified site of right female breast: Secondary | ICD-10-CM

## 2011-12-09 DIAGNOSIS — C50919 Malignant neoplasm of unspecified site of unspecified female breast: Secondary | ICD-10-CM

## 2011-12-09 NOTE — Patient Instructions (Signed)
Continue regular self exams Continue Arimidex 

## 2011-12-13 ENCOUNTER — Encounter (INDEPENDENT_AMBULATORY_CARE_PROVIDER_SITE_OTHER): Payer: Self-pay | Admitting: General Surgery

## 2011-12-13 NOTE — Progress Notes (Signed)
Subjective:     Patient ID: [name], female   DOB: 09/28/1951, 61 y.o.   MRN: [id number]  HPI The patient is a 61-year-old white female who is now 5 months out from a right mastectomy and axillary node dissection for a T1 C. N1 a right breast cancer. Since her last visit she has been working around the house a fair bit and has felt some tightness in her back incision. Otherwise she has no real complaints.  Review of Systems  Constitutional: Negative.   HENT: Negative.   Eyes: Negative.   Respiratory: Negative.   Cardiovascular: Negative.   Gastrointestinal: Negative.   Genitourinary: Negative.   Musculoskeletal: Negative.   Skin: Negative.   Neurological: Negative.   Hematological: Negative.   Psychiatric/Behavioral: Negative.        Objective:   Physical Exam  Constitutional: She is oriented to person, place, and time. She appears well-developed and well-nourished.  HENT:  Head: Normocephalic and atraumatic.  Eyes: Conjunctivae and EOM are normal. Pupils are equal, round, and reactive to light.  Neck: Normal range of motion. Neck supple.  Cardiovascular: Normal rate, regular rhythm and normal heart sounds.   Pulmonary/Chest: Effort normal and breath sounds normal.       Her right reconstruction is intact. No palpable mass in either breast. No axillary supraclavicular or cervical lymphadenopathy. Her latissimus incision looks good  Abdominal: Soft. Bowel sounds are normal. She exhibits no mass. There is no tenderness.  Musculoskeletal: Normal range of motion.  Lymphadenopathy:    She has no cervical adenopathy.  Neurological: She is alert and oriented to person, place, and time.  Skin: Skin is warm and dry.  Psychiatric: She has a normal mood and affect. Her behavior is normal.       Assessment:     5 months out from a right mastectomy and axillary node dissection    Plan:     At this point she will continue to do regular self exams. She will continue taking  Arimidex. She will be seen Dr. Darnelle Catalan in March. We will plan to see her back in 6 months

## 2012-01-27 HISTORY — PX: BREAST SURGERY: SHX581

## 2012-02-07 ENCOUNTER — Ambulatory Visit (HOSPITAL_BASED_OUTPATIENT_CLINIC_OR_DEPARTMENT_OTHER): Payer: 59 | Admitting: Physician Assistant

## 2012-02-07 ENCOUNTER — Telehealth: Payer: Self-pay | Admitting: *Deleted

## 2012-02-07 ENCOUNTER — Encounter: Payer: Self-pay | Admitting: Physician Assistant

## 2012-02-07 ENCOUNTER — Ambulatory Visit: Payer: 59 | Admitting: Physician Assistant

## 2012-02-07 ENCOUNTER — Other Ambulatory Visit (HOSPITAL_BASED_OUTPATIENT_CLINIC_OR_DEPARTMENT_OTHER): Payer: 59 | Admitting: Lab

## 2012-02-07 VITALS — BP 132/83 | HR 103 | Temp 97.9°F | Ht 66.0 in | Wt 160.4 lb

## 2012-02-07 DIAGNOSIS — F329 Major depressive disorder, single episode, unspecified: Secondary | ICD-10-CM

## 2012-02-07 DIAGNOSIS — M810 Age-related osteoporosis without current pathological fracture: Secondary | ICD-10-CM

## 2012-02-07 DIAGNOSIS — M8580 Other specified disorders of bone density and structure, unspecified site: Secondary | ICD-10-CM

## 2012-02-07 DIAGNOSIS — C50911 Malignant neoplasm of unspecified site of right female breast: Secondary | ICD-10-CM

## 2012-02-07 DIAGNOSIS — Z170 Estrogen receptor positive status [ER+]: Secondary | ICD-10-CM

## 2012-02-07 DIAGNOSIS — C50419 Malignant neoplasm of upper-outer quadrant of unspecified female breast: Secondary | ICD-10-CM

## 2012-02-07 DIAGNOSIS — Z1231 Encounter for screening mammogram for malignant neoplasm of breast: Secondary | ICD-10-CM

## 2012-02-07 DIAGNOSIS — C50919 Malignant neoplasm of unspecified site of unspecified female breast: Secondary | ICD-10-CM

## 2012-02-07 DIAGNOSIS — F32A Depression, unspecified: Secondary | ICD-10-CM

## 2012-02-07 LAB — CBC WITH DIFFERENTIAL/PLATELET
BASO%: 1.1 % (ref 0.0–2.0)
Eosinophils Absolute: 0.1 10*3/uL (ref 0.0–0.5)
HCT: 37.7 % (ref 34.8–46.6)
LYMPH%: 25.7 % (ref 14.0–49.7)
MONO#: 0.3 10*3/uL (ref 0.1–0.9)
NEUT#: 4 10*3/uL (ref 1.5–6.5)
Platelets: 292 10*3/uL (ref 145–400)
RBC: 4.24 10*6/uL (ref 3.70–5.45)
WBC: 6.1 10*3/uL (ref 3.9–10.3)
lymph#: 1.6 10*3/uL (ref 0.9–3.3)

## 2012-02-07 LAB — COMPREHENSIVE METABOLIC PANEL
ALT: 19 U/L (ref 0–35)
Albumin: 4.3 g/dL (ref 3.5–5.2)
CO2: 25 mEq/L (ref 19–32)
Calcium: 9.2 mg/dL (ref 8.4–10.5)
Chloride: 107 mEq/L (ref 96–112)
Glucose, Bld: 89 mg/dL (ref 70–99)
Sodium: 140 mEq/L (ref 135–145)
Total Bilirubin: 0.3 mg/dL (ref 0.3–1.2)
Total Protein: 6.8 g/dL (ref 6.0–8.3)

## 2012-02-07 MED ORDER — LORAZEPAM 0.5 MG PO TABS: 0.5000 mg | ORAL_TABLET | Freq: Two times a day (BID) | ORAL | Status: DC | PRN

## 2012-02-07 MED ORDER — ANASTROZOLE 1 MG PO TABS: 1.0000 mg | ORAL_TABLET | Freq: Every day | ORAL | Status: DC

## 2012-02-07 NOTE — Telephone Encounter (Signed)
made patient appointment for 07-2012 and called michelle gave patient appointment for 01-2012 printed out calendar and gave to the patient made patient appointment for mammogram at [facility]

## 2012-02-07 NOTE — Progress Notes (Signed)
ID: [name]   DOB: 1951-02-08  MR#: [id number]  CSN#:[id number]  HISTORY OF PRESENT ILLNESS: [name] is a 61 year old [place] woman with a history of breast cancer seeking to establish herself in my service.  The patient formally saw Dr. Donnie Coffin for a remote history of right breast cancer status post lumpectomy and axillary lymph node dissection in August 1998.  This was a T1c N0, estrogen and progesterone receptor positive tumor, which was treated adjuvantly with doxorubicin and cyclophosphamide x4 followed by adjuvant radiation completed in March 1998 followed by 5 years of tamoxifen completed in January 2004.  More recently, screening mammography at [facility], June 2012, found a possible abnormality in the right breast.  The patient was brought back for additional views the next day and additional spot compression views were obtained, with the report not available in our system.  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   With this information, the patient was referred for bilateral breast MRIs, May 15, 2011.  In addition to the prior lumpectomy changes, there was an enhancing irregular mass in the lower-outer quadrant of the right breast measuring 1.6 cm.  The rest of the MRI exam was unremarkable except for a mildly prominent right axillary lymph node.  With this information, the patient was presented at the [facility] and the recommendation was made for mastectomy with an Oncotype to be sent at the same time.   The patient did undergo right  mastectomy, Dr. Carolynne Edouard, with sentinel lymph node biopsy, and immediate reconstruction with a saline implant and right latissimus myocutaneous flap under [name], August 10th.  The pathology from this procedure ([id number]) confirmed a well-differentiated invasive ductal carcinoma measuring 1.5 cm with ample margins and 1/6 lymph nodes involved.  HER2 was repeated and was again negative.   With this information, the patient was seen by Dr. Donnie Coffin and started on anastrozole as well as Rivka Barbara given her history of osteoporosis.  She has also been seen by Genetics and was BRACA 1 and 2 negative.  She  had an Oncotype DX with a recurrence score of 14, predicting a 9% risk of distant recurrence within 10 years if she took 5 years of tamoxifen.  The decision was made to forego chemotherapy, and the patient was started on aromatase inhibitor, specifically anastrozole in September 2012.  Patient also has a history of osteoporosis, and is status post one dose of Xgeva in September 2012. This has been switched to zoledronic acid, to be given every 6 months.  INTERVAL HISTORY: [name] returns today for routine six-month followup of her right breast carcinoma. Interval history is remarkable for [name] having undergone some physical therapy this past November for her right upper extremity. This did seem to help her range of motion, but did little for her pain control. She has found that gabapentin has helped significantly to control her postsurgical pain, control her hot flashes, and help her sleep at night. She continues on 300 mg nightly.  [name] has been under quite a bit of stress lately. Her husband has had some recent health problems that they  have been working through. She has had some increased anxiety for which she uses lorazepam appropriately and affectively. She requests a refill today. She denies any depression. She also denies any suicidal ideations.  Physically, [name]'s only complaints are some mild  fatigue, and some numbness in the first 3 digits of her right hand. This is just recently developed, seems to occur primarily when she wakes up in the mornings, and is seems to be associated with using the mouse on her computer. What she describes really sounds like carpal tunnel syndrome, which [name] tells me she had "diagnosed herself well". She has no additional peripheral neuropathy.  [name] is tolerating the anastrozole well. She has no increased joint pain. No increased vaginal dryness. As noted above, she had some occasional hot flashes, but these improved significantly with the gabapentin. I will also note that [name] is up-to-date with her dental exams. She's had no recent dental procedures, no extractions, and denies any jaw pain.   A detailed review of systems is otherwise noncontributory as noted below.  Review of Systems: Constitutional:  Mild anxiety, no weight loss, fever, night sweats and feels well Eyes: negative HQI:[id number] Cardiovascular: no chest pain or dyspnea on exertion Respiratory: no cough, shortness of breath, or wheezing Neurological: no TIA or stroke symptoms, positive for numbness in right hand Dermatological: negative Gastrointestinal: no abdominal pain, nausea, change in bowel habits, or black or bloody stools Genito-Urinary: no dysuria, trouble voiding, or hematuria Hematological and Lymphatic: negative Breast: negative Musculoskeletal: negative Remaining ROS negative.     PAST MEDICAL HISTORY: Past Medical History  Diagnosis Date  . Cancer 1998  . Chicken pox   . Left hip pain 05/04/2011  . Colon polyps   . Dyslipidemia   . Tobacco abuse 05/05/2011  . Osteoporosis 02/07/2012    PAST SURGICAL HISTORY: Past Surgical History  Procedure Date  . Appendectomy 2010  . Breast lumpectomy 1998    right  . Breast surgery 07/08/11    right mastectomy+axlnd, reconstruction,T1cN1a,ERPR+,HER2-    FAMILY HISTORY Family History  Problem Relation Age of Onset   . Diabetes Mother     type 2  . Obesity Mother   . Other Mother   . Cancer Father     ?  Marland Kitchen Other Maternal Grandmother     heart palpitations  . Other Paternal Grandmother     heart arrythmia/ palpitations   GYNECOLOGIC HISTORY:  She had menarche, age 65.  Menopause at age 75.  She is GX, P2.  First pregnancy to term, age 48.  She never took hormone replacement.  SOCIAL HISTORY:  She works in Airline pilot.  Her husband Rocky Morel") Waitman who has a history of prostate cancer, was in Holiday representative but is now retired.  They have 2 children, a son and a daughter, both living in this area and 2 grandchildren.  The patient is not a church attender.   HEALTH MAINTENANCE: History  Substance Use Topics  . Smoking status: Former Games developer  . Smokeless tobacco: Never Used  . Alcohol Use: Yes     occasionally     Colonoscopy:  PAP:  Bone density:  Lipid panel:  No Known Allergies  Current Outpatient Prescriptions  Medication Sig Dispense Refill  . anastrozole (ARIMIDEX) 1 MG tablet Take 1 tablet (1 mg total) by mouth daily.  30 tablet  11  . buPROPion (WELLBUTRIN XL) 150 MG 24 hr tablet TAKE ONE TABLET BY MOUTH EVERY DAY FOR 4 DAYS THEN INCREASE TO TWO TABLETS EVERY  DAY  60 tablet  0  . cyclobenzaprine (FLEXERIL) 10 MG tablet Take 1 tablet (10 mg total) by mouth every 8 (eight) hours as needed for muscle spasms.  12 tablet  0  . gabapentin (NEURONTIN) 300 MG capsule       . LORazepam (ATIVAN) 0.5 MG tablet Take 1 tablet (0.5 mg total) by mouth 2 (two) times daily as needed for anxiety.  30 tablet  0    OBJECTIVE: Filed Vitals:   02/07/12 0937  BP: 132/83  Pulse: 103  Temp: 97.9 F (36.6 C)     Body mass index is 25.89 kg/(m^2).    ECOG FS: 0  Physical Exam: HEENT:  Sclerae anicteric, conjunctivae pink.  Oropharynx clear.  No mucositis or candidiasis.   Nodes:  No cervical, supraclavicular, or axillary lymphadenopathy palpated.  Breast Exam: Status post right mastectomy with  implant and latissimus flap reconstruction. No suspicious nodularity or skin changes. No evidence of local recurrence. Left breast is benign, no masses, skin changes, or nipple inversion.    Lungs:  Clear to auscultation bilaterally.  No crackles, rhonchi, or wheezes.   Heart:  Regular rate and rhythm.   Abdomen:  Soft, nontender.  Positive bowel sounds.  No organomegaly or masses palpated.   Musculoskeletal:  Kyphosis noted on exam. No focal spinal tenderness to palpation.  Extremities:  Benign.  No peripheral edema or cyanosis.   Skin:  Benign.   Neuro:  Nonfocal. Alert and oriented x3.    LAB RESULTS: Lab Results  Component Value Date   WBC 6.1 02/07/2012   NEUTROABS 4.0 02/07/2012   HGB 12.7 02/07/2012   HCT 37.7 02/07/2012   MCV 88.8 02/07/2012   PLT 292 02/07/2012      Chemistry      Component Value Date/Time   NA 140 02/07/2012 0908   K 4.0 02/07/2012 0908   CL 107 02/07/2012 0908   CO2 25 02/07/2012 0908   BUN 17 02/07/2012 0908   CREATININE 0.58 02/07/2012 0908      Component Value Date/Time   CALCIUM 9.2 02/07/2012 0908   ALKPHOS 60 02/07/2012 0908   AST 15 02/07/2012 0908   ALT 19 02/07/2012 0908   BILITOT 0.3 02/07/2012 0908       Lab Results  Component Value Date   LABCA2 28 05/18/2011    STUDIES: Patient is due for her next left mammogram at [facility] in June 2013.  A bone density at [facility] in June of 2012 confirmed increasing osteoporosis.  ASSESSMENT: 61 year old [place] woman status post: 1. Right lumpectomy and axillary lymph node dissection,  1998, for a T1c N0, estrogen receptor positive breast cancer, treated adjuvantly with doxorubicin and cyclophosphamide times 4, followed by radiation then tamoxifen for 5 years now. 2. Right mastectomy with immediate implant and latissimus flap reconstruction, August 2012, for a T1c N1, grade 1, invasive ductal carcinoma, estrogen and progesterone receptor positive, HER2 negative, with an MIB1 of 13%.  (3)  Oncotype DX  recurrence score was 14 predicting for a 9% risk of distant recurrence within 10 years if she took 5 years of tamoxifen.  Accordingly, a decision was made to forego chemotherapy and she is receiving an aromatase inhibitor since she had tamoxifen for 5 years previously. On anastrozole for in September 2012.  (4)  osteoporosis, status post one injection of Xgeva in September 2012, now due for zoledronic acid which we given every 6 months.  PLAN: With regards to her breast cancer, Graciella Belton continues to  do well, and will continue on anastrozole daily which I have refilled for her today. Over half of our 30 minute appointment today was spent reviewing her prognosis, discussing her osteoporosis, discussing the possible side effects associated with zoledronic acid, and coordinating care. She is ready to proceed with zoledronic acid. Her labs were drawn today including a metabolic panel with serum creatinine, and we will schedule her for her first infusion next week.   Lamyra is due for her next mammogram in June we will schedule that for her today. She'll return in 6 months time for repeat labs, followup with Dr. Darnelle Catalan, and her next of zoledronic acid. Of course she knows to call meanwhile any changes or problems.  Adrianah Prophete    02/07/2012

## 2012-02-14 ENCOUNTER — Ambulatory Visit (HOSPITAL_BASED_OUTPATIENT_CLINIC_OR_DEPARTMENT_OTHER): Payer: 59

## 2012-02-14 DIAGNOSIS — C50911 Malignant neoplasm of unspecified site of right female breast: Secondary | ICD-10-CM

## 2012-02-14 DIAGNOSIS — M810 Age-related osteoporosis without current pathological fracture: Secondary | ICD-10-CM

## 2012-02-14 MED ORDER — ZOLEDRONIC ACID 4 MG/100ML IV SOLN
4.0000 mg | Freq: Once | INTRAVENOUS | Status: AC
  Administered 2012-02-14: 4 mg via INTRAVENOUS
  Filled 2012-02-14: qty 100

## 2012-02-14 NOTE — Patient Instructions (Signed)
Call your physician as needed for any problems. [phone]

## 2012-06-07 ENCOUNTER — Telehealth (INDEPENDENT_AMBULATORY_CARE_PROVIDER_SITE_OTHER): Payer: Self-pay | Admitting: General Surgery

## 2012-06-07 ENCOUNTER — Encounter (INDEPENDENT_AMBULATORY_CARE_PROVIDER_SITE_OTHER): Payer: Self-pay | Admitting: General Surgery

## 2012-06-07 ENCOUNTER — Ambulatory Visit (INDEPENDENT_AMBULATORY_CARE_PROVIDER_SITE_OTHER): Payer: 59 | Admitting: General Surgery

## 2012-06-07 VITALS — BP 126/82 | HR 80 | Temp 98.3°F | Resp 16 | Ht 67.0 in | Wt 153.0 lb

## 2012-06-07 DIAGNOSIS — C50911 Malignant neoplasm of unspecified site of right female breast: Secondary | ICD-10-CM

## 2012-06-07 DIAGNOSIS — C50919 Malignant neoplasm of unspecified site of unspecified female breast: Secondary | ICD-10-CM

## 2012-06-07 NOTE — Telephone Encounter (Signed)
Called Mrs Laye today and told her that Dr Carolynne Edouard received a phone call from Dr Odis Luster and Dr Odis Luster wanted pt to make appt in his office to reck her breast. I gave pt Dr Derryl Harbor new office phone number [phone]

## 2012-06-19 ENCOUNTER — Encounter (INDEPENDENT_AMBULATORY_CARE_PROVIDER_SITE_OTHER): Payer: Self-pay | Admitting: General Surgery

## 2012-06-19 NOTE — Progress Notes (Signed)
Subjective:     Patient ID: [name], female   DOB: [birth year], 61 y.o.   MRN: [id number]  HPI The patient is a 61 year old white female who is one year out from a right mastectomy and axillary lymph node dissection for a T1cN1 right breast cancer. Her main complaint is of some tightness in her latissimus incision and some soreness with activity. Otherwise she seems to be doing recently well. She continues to take Arimidex and is tolerating it well.  Review of Systems  Constitutional: Negative.   HENT: Negative.   Eyes: Negative.   Respiratory: Negative.   Cardiovascular: Negative.   Gastrointestinal: Negative.   Genitourinary: Negative.   Musculoskeletal: Negative.   Skin: Negative.   Neurological: Negative.   Hematological: Negative.   Psychiatric/Behavioral: Negative.        Objective:   Physical Exam  Constitutional: She is oriented to person, place, and time. She appears well-developed and well-nourished.  HENT:  Head: Normocephalic and atraumatic.  Eyes: Conjunctivae and EOM are normal. Pupils are equal, round, and reactive to light.  Neck: Normal range of motion. Neck supple.  Cardiovascular: Normal rate, regular rhythm and normal heart sounds.   Pulmonary/Chest: Effort normal and breath sounds normal.       Her incisions have healed nicely. There is no palpable mass of the right reconstructed breast. There is no palpable mass of the left breast. There is no palpable axillary or supraclavicular cervical lymphadenopathy.  Abdominal: Soft. Bowel sounds are normal. She exhibits no mass. There is no tenderness.  Musculoskeletal: Normal range of motion.  Lymphadenopathy:    She has no cervical adenopathy.  Neurological: She is alert and oriented to person, place, and time.  Skin: Skin is warm and dry.  Psychiatric: She has a normal mood and affect. Her behavior is normal.       Assessment:     1 year status post right mastectomy and axillary lymph node dissection  with latissimus reconstruction. Overall she's doing well. I spoke to Dr. Odis Luster about her soreness and he feels that physical therapy would be best for her.    Plan:     At this point we will refer her to physical therapy. She will continue Arimidex. She will continue to do regular self exams. We will plan to see her back in 6 months

## 2012-07-03 ENCOUNTER — Other Ambulatory Visit: Payer: Self-pay | Admitting: Family Medicine

## 2012-08-07 ENCOUNTER — Other Ambulatory Visit (HOSPITAL_BASED_OUTPATIENT_CLINIC_OR_DEPARTMENT_OTHER): Payer: 59 | Admitting: Lab

## 2012-08-07 DIAGNOSIS — C50919 Malignant neoplasm of unspecified site of unspecified female breast: Secondary | ICD-10-CM

## 2012-08-07 DIAGNOSIS — C50911 Malignant neoplasm of unspecified site of right female breast: Secondary | ICD-10-CM

## 2012-08-07 LAB — CBC WITH DIFFERENTIAL/PLATELET
Basophils Absolute: 0.1 10*3/uL (ref 0.0–0.1)
Eosinophils Absolute: 0.1 10*3/uL (ref 0.0–0.5)
HCT: 38 % (ref 34.8–46.6)
HGB: 12.4 g/dL (ref 11.6–15.9)
MCH: 29.6 pg (ref 25.1–34.0)
MCV: 90.4 fL (ref 79.5–101.0)
MONO%: 6.4 % (ref 0.0–14.0)
NEUT#: 4.1 10*3/uL (ref 1.5–6.5)
NEUT%: 62 % (ref 38.4–76.8)
RDW: 14.3 % (ref 11.2–14.5)
lymph#: 1.9 10*3/uL (ref 0.9–3.3)

## 2012-08-07 LAB — COMPREHENSIVE METABOLIC PANEL (CC13)
ALT: 19 U/L (ref 0–55)
CO2: 26 mEq/L (ref 22–29)
Calcium: 9.3 mg/dL (ref 8.4–10.4)
Chloride: 106 mEq/L (ref 98–107)
Glucose: 102 mg/dl — ABNORMAL HIGH (ref 70–99)
Sodium: 140 mEq/L (ref 136–145)
Total Protein: 6.8 g/dL (ref 6.4–8.3)

## 2012-08-08 LAB — VITAMIN D 25 HYDROXY (VIT D DEFICIENCY, FRACTURES): Vit D, 25-Hydroxy: 30 ng/mL (ref 30–89)

## 2012-08-14 ENCOUNTER — Other Ambulatory Visit: Payer: 59 | Admitting: Lab

## 2012-08-14 ENCOUNTER — Ambulatory Visit (HOSPITAL_BASED_OUTPATIENT_CLINIC_OR_DEPARTMENT_OTHER): Payer: 59

## 2012-08-14 ENCOUNTER — Ambulatory Visit (HOSPITAL_BASED_OUTPATIENT_CLINIC_OR_DEPARTMENT_OTHER): Payer: 59 | Admitting: Oncology

## 2012-08-14 VITALS — BP 138/80 | HR 82 | Temp 98.4°F | Resp 20 | Ht 67.0 in | Wt 156.1 lb

## 2012-08-14 DIAGNOSIS — C50519 Malignant neoplasm of lower-outer quadrant of unspecified female breast: Secondary | ICD-10-CM

## 2012-08-14 DIAGNOSIS — C50911 Malignant neoplasm of unspecified site of right female breast: Secondary | ICD-10-CM

## 2012-08-14 DIAGNOSIS — C773 Secondary and unspecified malignant neoplasm of axilla and upper limb lymph nodes: Secondary | ICD-10-CM

## 2012-08-14 DIAGNOSIS — M810 Age-related osteoporosis without current pathological fracture: Secondary | ICD-10-CM

## 2012-08-14 MED ORDER — ZOLEDRONIC ACID 4 MG/5ML IV CONC
4.0000 mg | Freq: Once | INTRAVENOUS | Status: AC
  Administered 2012-08-14: 4 mg via INTRAVENOUS
  Filled 2012-08-14: qty 5

## 2012-08-14 NOTE — Patient Instructions (Signed)
Zoledronic Acid injection (Hypercalcemia, Oncology) What is this medicine? ZOLEDRONIC ACID (ZOE le dron ik AS id) lowers the amount of calcium loss from bone. It is used to treat too much calcium in your blood from cancer. It is also used to prevent complications of cancer that has spread to the bone. This medicine may be used for other purposes; ask your health care provider or pharmacist if you have questions. What should I tell my health care provider before I take this medicine? They need to know if you have any of these conditions: -aspirin-sensitive asthma -dental disease -kidney disease -an unusual or allergic reaction to zoledronic acid, other medicines, foods, dyes, or preservatives -pregnant or trying to get pregnant -breast-feeding How should I use this medicine? This medicine is for infusion into a vein. It is given by a health care professional in a hospital or clinic setting. Talk to your pediatrician regarding the use of this medicine in children. Special care may be needed. Overdosage: If you think you have taken too much of this medicine contact a poison control center or emergency room at once. NOTE: This medicine is only for you. Do not share this medicine with others. What if I miss a dose? It is important not to miss your dose. Call your doctor or health care professional if you are unable to keep an appointment. What may interact with this medicine? -certain antibiotics given by injection -NSAIDs, medicines for pain and inflammation, like ibuprofen or naproxen -some diuretics like bumetanide, furosemide -teriparatide -thalidomide This list may not describe all possible interactions. Give your health care provider a list of all the medicines, herbs, non-prescription drugs, or dietary supplements you use. Also tell them if you smoke, drink alcohol, or use illegal drugs. Some items may interact with your medicine. What should I watch for while using this medicine? Visit  your doctor or health care professional for regular checkups. It may be some time before you see the benefit from this medicine. Do not stop taking your medicine unless your doctor tells you to. Your doctor may order blood tests or other tests to see how you are doing. Women should inform their doctor if they wish to become pregnant or think they might be pregnant. There is a potential for serious side effects to an unborn child. Talk to your health care professional or pharmacist for more information. You should make sure that you get enough calcium and vitamin D while you are taking this medicine. Discuss the foods you eat and the vitamins you take with your health care professional. Some people who take this medicine have severe bone, joint, and/or muscle pain. This medicine may also increase your risk for a broken thigh bone. Tell your doctor right away if you have pain in your upper leg or groin. Tell your doctor if you have any pain that does not go away or that gets worse. What side effects may I notice from receiving this medicine? Side effects that you should report to your doctor or health care professional as soon as possible: -allergic reactions like skin rash, itching or hives, swelling of the face, lips, or tongue -anxiety, confusion, or depression -breathing problems -changes in vision -feeling faint or lightheaded, falls -jaw burning, cramping, pain -muscle cramps, stiffness, or weakness -trouble passing urine or change in the amount of urine Side effects that usually do not require medical attention (report to your doctor or health care professional if they continue or are bothersome): -bone, joint, or muscle pain -  fever -hair loss -irritation at site where injected -loss of appetite -nausea, vomiting -stomach upset -tired This list may not describe all possible side effects. Call your doctor for medical advice about side effects. You may report side effects to FDA at  1-800-FDA-1088. Where should I keep my medicine? This drug is given in a hospital or clinic and will not be stored at home. NOTE: This sheet is a summary. It may not cover all possible information. If you have questions about this medicine, talk to your doctor, pharmacist, or health care provider.  2012, [employer] Standard. (05/13/2011 9:06:58 AM) 

## 2012-08-14 NOTE — Progress Notes (Signed)
ID: [name]   DOB: 02/23/1951  MR#: [id number]  CSN#:[id number]  PCP: Brittany Edge, MD GYN:  SU:  OTHER MD:   HISTORY OF PRESENT ILLNESS: The patient formally saw Dr. Donnie Mitchell for a remote history of right breast cancer status post lumpectomy and axillary lymph node dissection in August 1998.  This was a T1c N0, estrogen and progesterone receptor positive tumor, which was treated adjuvantly with doxorubicin and cyclophosphamide x4 followed by adjuvant radiation completed in March 1998 followed by 5 years of tamoxifen completed in January 2004.  More recently, screening mammography at [name], June 2012, found a possible abnormality in the right breast.  The patient was brought back for additional views the next day and additional spot compression views were obtained, with the report not available in our system.  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   With this information, the patient was referred for bilateral breast MRIs, May 15, 2011.  In addition to the prior lumpectomy changes, there was an enhancing irregular mass in the lower-outer quadrant of the right breast measuring 1.6 cm.  The rest of the MRI exam was unremarkable except for a mildly prominent right axillary lymph node.  With this information, the patient was presented at the [name] and the recommendation was made for mastectomy with an Oncotype to be sent at the same time.   The patient did undergo right mastectomy, Dr. Carolynne Mitchell, with sentinel lymph node biopsy, and immediate  reconstruction with a saline implant and right latissimus myocutaneous flap under [name], August 10th.  The pathology from this procedure ([id number]) confirmed a well-differentiated invasive ductal carcinoma measuring 1.5 cm with ample margins and 1/6 lymph nodes involved.  HER2 was repeated and was again negative.   With this information, the patient was seen by Dr. Donnie Mitchell and started on anastrozole as well as Rivka Barbara given her history of osteoporosis.  She has also been seen by Genetics and had an Oncotype DX as noted below. She established herself in my service October 2012.  INTERVAL HISTORY: [name] returns with her husband [name] for followup of her breast cancer. The interval history is generally unremarkable. She continues to work 5 days a week, between 8 in the morning and 5:30 in the afternoon. As a result she does not exercise regularly.  REVIEW OF SYSTEMS: She has some postoperative tightness in the upper right chest, as expected, and occasionally, very rarely, she has a transient pain "like a hot poker" on her right posterior scar. She has occasional aches and pains here in there which are not persistent or intense. She is sleeping a lot better on the gabapentin, and the hot flashes are much improved. Otherwise a detailed review of systems today was noncontributory  PAST MEDICAL HISTORY: Past Medical History  Diagnosis Date  . Cancer 1998  . Chicken pox   . Left hip pain 05/04/2011  . Colon polyps   . Dyslipidemia   . Tobacco abuse 05/05/2011  . Osteoporosis 02/07/2012  Significant for appendectomy remotely, history of osteopenia/osteoporosis, history of dyslipidemia, and history of 15 pack-year tobacco abuse, the patient  quitting approximately 3 months ago.  PAST SURGICAL HISTORY: Past Surgical History  Procedure Date  . Appendectomy 2010  . Breast lumpectomy 1998    right  . Breast surgery 07/08/11    right mastectomy+axlnd, reconstruction,T1cN1a,ERPR+,HER2-    FAMILY  HISTORY Family History  Problem Relation Age of Onset  . Diabetes Mother     type 2  . Obesity Mother   . Other Mother   . Cancer Father     ?  Marland Kitchen Other Maternal Grandmother     heart palpitations  . Other Paternal Grandmother     heart arrythmia/ palpitations  This is essentially noncontributory.  The patient's father did die from cancer but they do not know the primary.  It seems to have been most likely a lung cancer that was widely metastatic at diagnosis.  He was 61 years old at that time.  Otherwise, there is no history of breast or ovarian cancer in the patient's family  GYNECOLOGIC HISTORY: She had menarche, age 48.  Menopause at age 13.  She is GX, P2.  First pregnancy to term, age 55.  She never took hormone replacement.  SOCIAL HISTORY: She works in Airline pilot.  Her husband [name]") [place] has a history of prostate cancer, was in Holiday representative but is now retired.  They have 2 children, a son and a daughter, both living in this area and 2 grandchildren.  The patient is not a church attender.   ADVANCED DIRECTIVES:  HEALTH MAINTENANCE: History  Substance Use Topics  . Smoking status: Former Games developer  . Smokeless tobacco: Never Used  . Alcohol Use: Yes     occasionally  As noted, the patient has a 15 pack-year tobacco abuse history but quit 3 months ago.  There is no history of alcohol abuse.  She is up-to-date on colonoscopies which she gets through the [facility].  She had a bone density in October 2007, which showed osteopenia and a repeat in January 2011, which showed osteoporosis with a T-score of -2.7 in the right femoral neck.  The patient has mild dyslipidemia which is being treated with diet.  She is up-to-date on her Pap smears through Dr. Scherrie [facility].   Colonoscopy:  PAP:  Bone density:  Lipid panel:  No Known Allergies  Current Outpatient Prescriptions  Medication Sig Dispense Refill  . anastrozole (ARIMIDEX) 1 MG tablet Take 1 tablet (1 mg total) by  mouth daily.  30 tablet  11  . buPROPion (WELLBUTRIN XL) 150 MG 24 hr tablet TAKE ONE TABLET BY MOUTH EVERY DAY FOR 4 DAYS THEN INCREASE TO TWO TABLETS EVERY DAY  60 tablet  0  . LORazepam (ATIVAN) 0.5 MG tablet Take 1 tablet (0.5 mg total) by mouth 2 (two) times daily as needed for anxiety.  30 tablet  0  . gabapentin (NEURONTIN) 300 MG capsule         OBJECTIVE: Middle-aged white woman in no acute distress Filed Vitals:   08/14/12 0944  BP: 138/80  Pulse: 82  Temp: 98.4 F (36.9 C)  Resp: 20     Body mass index is 24.45 kg/(m^2).    ECOG FS: 0  Sclerae unicteric Oropharynx clear No cervical or supraclavicular adenopathy Lungs no rales or rhonchi Heart regular rate and rhythm Abd benign MSK no focal spinal tenderness, no peripheral edema Neuro: nonfocal Breasts: The right breast is status post mastectomy and reconstruction. There is no evidence of local recurrence. The left breast is unremarkable.  LAB RESULTS: Lab Results  Component Value Date   WBC 6.6 08/07/2012   NEUTROABS 4.1 08/07/2012   HGB 12.4 08/07/2012   HCT 38.0 08/07/2012   MCV 90.4 08/07/2012   PLT 271 08/07/2012      Chemistry      Component Value Date/Time   NA 140 08/07/2012 1019   NA 140 02/07/2012 0908   K 4.1 08/07/2012 1019   K 4.0 02/07/2012 0908   CL 106 08/07/2012 1019   CL 107 02/07/2012 0908   CO2 26 08/07/2012 1019   CO2 25 02/07/2012 0908   BUN 16.0 08/07/2012 1019   BUN 17 02/07/2012 0908   CREATININE 0.7 08/07/2012 1019   CREATININE 0.58 02/07/2012 0908      Component Value Date/Time   CALCIUM 9.3 08/07/2012 1019   CALCIUM 9.2 02/07/2012 0908   ALKPHOS 72 08/07/2012 1019   ALKPHOS 60 02/07/2012 0908   AST 14 08/07/2012 1019   AST 15 02/07/2012 0908   ALT 19 08/07/2012 1019   ALT 19 02/07/2012 0908   BILITOT 0.40 08/07/2012 1019   BILITOT 0.3 02/07/2012 0908       Lab Results  Component Value Date   LABCA2 22 08/07/2012    No components found with this basename: [id number]    No results found  for this basename: INR:1;PROTIME:1 in the last 168 hours  Urinalysis No results found for this basename: colorurine, appearanceur, labspec, phurine, glucoseu, hgbur, bilirubinur, ketonesur, proteinur, urobilinogen, nitrite, leukocytesur    STUDIES: Mammography July 2013 was benign.  ASSESSMENT: 61 [facility].o. [facility] [place] [facility] status post:  (1) Right lumpectomy and axillary lymph node dissection  1998, for a T1c N0, estrogen receptor positive breast cancer, treated adjuvantly with doxorubicin and cyclophosphamide times 4, followed by radiation then tamoxifen for 5 years   (2) Right mastectomy with immediate implant and latissimus flap reconstruction, August 2012, for a T1c N1, grade 1, invasive ductal carcinoma, estrogen and progesterone receptor positive, HER2 negative, with an MIB1 of 13%.  (3) her Oncotype DX  recurrence score was 14 predicting for a 9% risk of distant recurrence within 10 years if she took 5 years of tamoxifen.    (4) on arnastrozole as of [facility] 2012  (5) with a history of osteoporosis, on denosumab initially, switched to zolendronic acid march 2013 because of possible further reduction in breast cancer recurrence, to continue every 6 months for 2 years, then yearly while on anastrozole  PLAN: [name] is doing great, with no evidence of disease recurrence. She will have a bone density before next visit here, which will be in 6 months, and she will have her third dose of zoledronic acid at that time and overall the plan is to continue anastrozole for a minimum of 5 years. She knows to call for any problems that may develop before the next visit.   Favor Hackler C    08/14/2012

## 2012-08-15 ENCOUNTER — Telehealth: Payer: Self-pay | Admitting: *Deleted

## 2012-08-15 NOTE — Telephone Encounter (Signed)
09-18-2012 bone denisty at 10:00am  02-12-2013 starting at 12:45pm

## 2012-08-17 ENCOUNTER — Telehealth: Payer: Self-pay | Admitting: Medical Oncology

## 2012-08-17 NOTE — Telephone Encounter (Signed)
Patient LMOVM stating "I have been having some muscle cramps in my legs since I got the Zometa"  Spoke to patient who stated that the cramps were getting better, denied fevers, chills, other muscle aches/pains.  No increase in activities nor contact with anyone who has been sick.  Patient states that she is also feeling a little nauseated this morning.  Instructed patient to try ginger ale and saltine crackers, patient expressed understanding.  "I looked up on the internet and these were all side effects of the zometa, so I was just really calling to let you know."  Instructed patient to call if symptoms become worse or any new symptoms arise and that I would inform Dr. Darnelle Catalan of above.  Patient expressed understanding, no further questions at this time.

## 2012-08-27 ENCOUNTER — Other Ambulatory Visit: Payer: Self-pay | Admitting: Family Medicine

## 2012-08-28 ENCOUNTER — Other Ambulatory Visit: Payer: Self-pay | Admitting: Family Medicine

## 2012-08-28 NOTE — Telephone Encounter (Signed)
Left a message for patient to return my call. 

## 2012-08-28 NOTE — Telephone Encounter (Signed)
Pt has not been seen since 6-12.

## 2012-08-31 NOTE — Telephone Encounter (Signed)
Pt voiced understanding and scheduled appt for 09-07-12

## 2012-09-07 ENCOUNTER — Encounter: Payer: Self-pay | Admitting: Family Medicine

## 2012-09-07 ENCOUNTER — Ambulatory Visit (INDEPENDENT_AMBULATORY_CARE_PROVIDER_SITE_OTHER): Payer: 59 | Admitting: Family Medicine

## 2012-09-07 VITALS — BP 138/92 | HR 115 | Temp 97.6°F | Ht 66.0 in | Wt 154.8 lb

## 2012-09-07 DIAGNOSIS — R071 Chest pain on breathing: Secondary | ICD-10-CM

## 2012-09-07 DIAGNOSIS — F17200 Nicotine dependence, unspecified, uncomplicated: Secondary | ICD-10-CM

## 2012-09-07 DIAGNOSIS — E785 Hyperlipidemia, unspecified: Secondary | ICD-10-CM

## 2012-09-07 DIAGNOSIS — R030 Elevated blood-pressure reading, without diagnosis of hypertension: Secondary | ICD-10-CM

## 2012-09-07 DIAGNOSIS — C50919 Malignant neoplasm of unspecified site of unspecified female breast: Secondary | ICD-10-CM

## 2012-09-07 DIAGNOSIS — C50911 Malignant neoplasm of unspecified site of right female breast: Secondary | ICD-10-CM

## 2012-09-07 DIAGNOSIS — Z0000 Encounter for general adult medical examination without abnormal findings: Secondary | ICD-10-CM

## 2012-09-07 DIAGNOSIS — G8929 Other chronic pain: Secondary | ICD-10-CM

## 2012-09-07 DIAGNOSIS — F329 Major depressive disorder, single episode, unspecified: Secondary | ICD-10-CM

## 2012-09-07 DIAGNOSIS — F341 Dysthymic disorder: Secondary | ICD-10-CM

## 2012-09-07 DIAGNOSIS — Z720 Tobacco use: Secondary | ICD-10-CM

## 2012-09-07 DIAGNOSIS — F418 Other specified anxiety disorders: Secondary | ICD-10-CM | POA: Insufficient documentation

## 2012-09-07 DIAGNOSIS — Z23 Encounter for immunization: Secondary | ICD-10-CM

## 2012-09-07 HISTORY — DX: Other specified anxiety disorders: F41.8

## 2012-09-07 MED ORDER — LORAZEPAM 0.5 MG PO TABS: 0.5000 mg | ORAL_TABLET | Freq: Two times a day (BID) | ORAL | Status: DC | PRN

## 2012-09-07 MED ORDER — ESCITALOPRAM OXALATE 10 MG PO TABS: 10.0000 mg | ORAL_TABLET | Freq: Every day | ORAL | Status: DC

## 2012-09-07 NOTE — Assessment & Plan Note (Signed)
Will start Escitalopram 10 mg daily and given a bit, Lorazepam prn will be represcribed

## 2012-09-07 NOTE — Assessment & Plan Note (Signed)
Is unfortunately smoking up to 3 cigarettes a day, encouraged complete cessation.

## 2012-09-07 NOTE — Patient Instructions (Addendum)
Preventive Care for Adults, Female A healthy lifestyle and preventive care can promote health and wellness. Preventive health guidelines for women include the following key practices.  A routine yearly physical is a good way to check with your caregiver about your health and preventive screening. It is a chance to share any concerns and updates on your health, and to receive a thorough exam.  Visit your dentist for a routine exam and preventive care every 6 months. Brush your teeth twice a day and floss once a day. Good oral hygiene prevents tooth decay and gum disease.  The frequency of eye exams is based on your age, health, family medical history, use of contact lenses, and other factors. Follow your caregiver's recommendations for frequency of eye exams.  Eat a healthy diet. Foods like vegetables, fruits, whole grains, low-fat dairy products, and lean protein foods contain the nutrients you need without too many calories. Decrease your intake of foods high in solid fats, added sugars, and salt. Eat the right amount of calories for you.Get information about a proper diet from your caregiver, if necessary.  Regular physical exercise is one of the most important things you can do for your health. Most adults should get at least 150 minutes of moderate-intensity exercise (any activity that increases your heart rate and causes you to sweat) each week. In addition, most adults need muscle-strengthening exercises on 2 or more days a week.  Maintain a healthy weight. The body mass index (BMI) is a screening tool to identify possible weight problems. It provides an estimate of body fat based on height and weight. Your caregiver can help determine your BMI, and can help you achieve or maintain a healthy weight.For adults 20 years and older:  A BMI below 18.5 is considered underweight.  A BMI of 18.5 to 24.9 is normal.  A BMI of 25 to 29.9 is considered overweight.  A BMI of 30 and above is  considered obese.  Maintain normal blood lipids and cholesterol levels by exercising and minimizing your intake of saturated fat. Eat a balanced diet with plenty of fruit and vegetables. Blood tests for lipids and cholesterol should begin at age 20 and be repeated every 5 years. If your lipid or cholesterol levels are high, you are over 50, or you are at high risk for heart disease, you may need your cholesterol levels checked more frequently.Ongoing high lipid and cholesterol levels should be treated with medicines if diet and exercise are not effective.  If you smoke, find out from your caregiver how to quit. If you do not use tobacco, do not start.  If you are pregnant, do not drink alcohol. If you are breastfeeding, be very cautious about drinking alcohol. If you are not pregnant and choose to drink alcohol, do not exceed 1 drink per day. One drink is considered to be 12 ounces (355 mL) of beer, 5 ounces (148 mL) of wine, or 1.5 ounces (44 mL) of liquor.  Avoid use of street drugs. Do not share needles with anyone. Ask for help if you need support or instructions about stopping the use of drugs.  High blood pressure causes heart disease and increases the risk of stroke. Your blood pressure should be checked at least every 1 to 2 years. Ongoing high blood pressure should be treated with medicines if weight loss and exercise are not effective.  If you are 55 to 61 years old, ask your caregiver if you should take aspirin to prevent strokes.  Diabetes   screening involves taking a blood sample to check your fasting blood sugar level. This should be done once every 3 years, after age 45, if you are within normal weight and without risk factors for diabetes. Testing should be considered at a younger age or be carried out more frequently if you are overweight and have at least 1 risk factor for diabetes.  Breast cancer screening is essential preventive care for women. You should practice "breast  self-awareness." This means understanding the normal appearance and feel of your breasts and may include breast self-examination. Any changes detected, no matter how small, should be reported to a caregiver. Women in their 20s and 30s should have a clinical breast exam (CBE) by a caregiver as part of a regular health exam every 1 to 3 years. After age 40, women should have a CBE every year. Starting at age 40, women should consider having a mammography (breast X-ray test) every year. Women who have a family history of breast cancer should talk to their caregiver about genetic screening. Women at a high risk of breast cancer should talk to their caregivers about having magnetic resonance imaging (MRI) and a mammography every year.  The Pap test is a screening test for cervical cancer. A Pap test can show cell changes on the cervix that might become cervical cancer if left untreated. A Pap test is a procedure in which cells are obtained and examined from the lower end of the uterus (cervix).  Women should have a Pap test starting at age 21.  Between ages 21 and 29, Pap tests should be repeated every 2 years.  Beginning at age 30, you should have a Pap test every 3 years as long as the past 3 Pap tests have been normal.  Some women have medical problems that increase the chance of getting cervical cancer. Talk to your caregiver about these problems. It is especially important to talk to your caregiver if a new problem develops soon after your last Pap test. In these cases, your caregiver may recommend more frequent screening and Pap tests.  The above recommendations are the same for women who have or have not gotten the vaccine for human papillomavirus (HPV).  If you had a hysterectomy for a problem that was not cancer or a condition that could lead to cancer, then you no longer need Pap tests. Even if you no longer need a Pap test, a regular exam is a good idea to make sure no other problems are  starting.  If you are between ages 65 and 70, and you have had normal Pap tests going back 10 years, you no longer need Pap tests. Even if you no longer need a Pap test, a regular exam is a good idea to make sure no other problems are starting.  If you have had past treatment for cervical cancer or a condition that could lead to cancer, you need Pap tests and screening for cancer for at least 20 years after your treatment.  If Pap tests have been discontinued, risk factors (such as a new sexual partner) need to be reassessed to determine if screening should be resumed.  The HPV test is an additional test that may be used for cervical cancer screening. The HPV test looks for the virus that can cause the cell changes on the cervix. The cells collected during the Pap test can be tested for HPV. The HPV test could be used to screen women aged 30 years and older, and should   be used in women of any age who have unclear Pap test results. After the age of 30, women should have HPV testing at the same frequency as a Pap test.  Colorectal cancer can be detected and often prevented. Most routine colorectal cancer screening begins at the age of 50 and continues through age 75. However, your caregiver may recommend screening at an earlier age if you have risk factors for colon cancer. On a yearly basis, your caregiver may provide home test kits to check for hidden blood in the stool. Use of a small camera at the end of a tube, to directly examine the colon (sigmoidoscopy or colonoscopy), can detect the earliest forms of colorectal cancer. Talk to your caregiver about this at age 50, when routine screening begins. Direct examination of the colon should be repeated every 5 to 10 years through age 75, unless early forms of pre-cancerous polyps or small growths are found.  Hepatitis C blood testing is recommended for all people born from 1945 through 1965 and any individual with known risks for hepatitis C.  Practice  safe sex. Use condoms and avoid high-risk sexual practices to reduce the spread of sexually transmitted infections (STIs). STIs include gonorrhea, chlamydia, syphilis, trichomonas, herpes, HPV, and human immunodeficiency virus (HIV). Herpes, HIV, and HPV are viral illnesses that have no cure. They can result in disability, cancer, and death. Sexually active women aged 25 and younger should be checked for chlamydia. Older women with new or multiple partners should also be tested for chlamydia. Testing for other STIs is recommended if you are sexually active and at increased risk.  Osteoporosis is a disease in which the bones lose minerals and strength with aging. This can result in serious bone fractures. The risk of osteoporosis can be identified using a bone density scan. Women ages 65 and over and women at risk for fractures or osteoporosis should discuss screening with their caregivers. Ask your caregiver whether you should take a calcium supplement or vitamin D to reduce the rate of osteoporosis.  Menopause can be associated with physical symptoms and risks. Hormone replacement therapy is available to decrease symptoms and risks. You should talk to your caregiver about whether hormone replacement therapy is right for you.  Use sunscreen with sun protection factor (SPF) of 30 or more. Apply sunscreen liberally and repeatedly throughout the day. You should seek shade when your shadow is shorter than you. Protect yourself by wearing long sleeves, pants, a wide-brimmed hat, and sunglasses year round, whenever you are outdoors.  Once a month, do a whole body skin exam, using a mirror to look at the skin on your back. Notify your caregiver of new moles, moles that have irregular borders, moles that are larger than a pencil eraser, or moles that have changed in shape or color.  Stay current with required immunizations.  Influenza. You need a dose every fall (or winter). The composition of the flu vaccine  changes each year, so being vaccinated once is not enough.  Pneumococcal polysaccharide. You need 1 to 2 doses if you smoke cigarettes or if you have certain chronic medical conditions. You need 1 dose at age 65 (or older) if you have never been vaccinated.  Tetanus, diphtheria, pertussis (Tdap, Td). Get 1 dose of Tdap vaccine if you are younger than age 65, are over 65 and have contact with an infant, are a healthcare worker, are pregnant, or simply want to be protected from whooping cough. After that, you need a Td   booster dose every 10 years. Consult your caregiver if you have not had at least 3 tetanus and diphtheria-containing shots sometime in your life or have a deep or dirty wound.  HPV. You need this vaccine if you are a woman age 26 or younger. The vaccine is given in 3 doses over 6 months.  Measles, mumps, rubella (MMR). You need at least 1 dose of MMR if you were born in 1957 or later. You may also need a second dose.  Meningococcal. If you are age 19 to 21 and a first-year college student living in a residence hall, or have one of several medical conditions, you need to get vaccinated against meningococcal disease. You may also need additional booster doses.  Zoster (shingles). If you are age 60 or older, you should get this vaccine.  Varicella (chickenpox). If you have never had chickenpox or you were vaccinated but received only 1 dose, talk to your caregiver to find out if you need this vaccine.  Hepatitis A. You need this vaccine if you have a specific risk factor for hepatitis A virus infection or you simply wish to be protected from this disease. The vaccine is usually given as 2 doses, 6 to 18 months apart.  Hepatitis B. You need this vaccine if you have a specific risk factor for hepatitis B virus infection or you simply wish to be protected from this disease. The vaccine is given in 3 doses, usually over 6 months. Preventive Services / Frequency Ages 19 to 39  Blood  pressure check.** / Every 1 to 2 years.  Lipid and cholesterol check.** / Every 5 years beginning at age 20.  Clinical breast exam.** / Every 3 years for women in their 20s and 30s.  Pap test.** / Every 2 years from ages 21 through 29. Every 3 years starting at age 30 through age 65 or 70 with a history of 3 consecutive normal Pap tests.  HPV screening.** / Every 3 years from ages 30 through ages 65 to 70 with a history of 3 consecutive normal Pap tests.  Hepatitis C blood test.** / For any individual with known risks for hepatitis C.  Skin self-exam. / Monthly.  Influenza immunization.** / Every year.  Pneumococcal polysaccharide immunization.** / 1 to 2 doses if you smoke cigarettes or if you have certain chronic medical conditions.  Tetanus, diphtheria, pertussis (Tdap, Td) immunization. / A one-time dose of Tdap vaccine. After that, you need a Td booster dose every 10 years.  HPV immunization. / 3 doses over 6 months, if you are 26 and younger.  Measles, mumps, rubella (MMR) immunization. / You need at least 1 dose of MMR if you were born in 1957 or later. You may also need a second dose.  Meningococcal immunization. / 1 dose if you are age 19 to 21 and a first-year college student living in a residence hall, or have one of several medical conditions, you need to get vaccinated against meningococcal disease. You may also need additional booster doses.  Varicella immunization.** / Consult your caregiver.  Hepatitis A immunization.** / Consult your caregiver. 2 doses, 6 to 18 months apart.  Hepatitis B immunization.** / Consult your caregiver. 3 doses usually over 6 months. Ages 40 to 64  Blood pressure check.** / Every 1 to 2 years.  Lipid and cholesterol check.** / Every 5 years beginning at age 20.  Clinical breast exam.** / Every year after age 40.  Mammogram.** / Every year beginning at age 40   and continuing for as long as you are in good health. Consult with your  caregiver.  Pap test.** / Every 3 years starting at age 30 through age 65 or 70 with a history of 3 consecutive normal Pap tests.  HPV screening.** / Every 3 years from ages 30 through ages 65 to 70 with a history of 3 consecutive normal Pap tests.  Fecal occult blood test (FOBT) of stool. / Every year beginning at age 50 and continuing until age 75. You may not need to do this test if you get a colonoscopy every 10 years.  Flexible sigmoidoscopy or colonoscopy.** / Every 5 years for a flexible sigmoidoscopy or every 10 years for a colonoscopy beginning at age 50 and continuing until age 75.  Hepatitis C blood test.** / For all people born from 1945 through 1965 and any individual with known risks for hepatitis C.  Skin self-exam. / Monthly.  Influenza immunization.** / Every year.  Pneumococcal polysaccharide immunization.** / 1 to 2 doses if you smoke cigarettes or if you have certain chronic medical conditions.  Tetanus, diphtheria, pertussis (Tdap, Td) immunization.** / A one-time dose of Tdap vaccine. After that, you need a Td booster dose every 10 years.  Measles, mumps, rubella (MMR) immunization. / You need at least 1 dose of MMR if you were born in 1957 or later. You may also need a second dose.  Varicella immunization.** / Consult your caregiver.  Meningococcal immunization.** / Consult your caregiver.  Hepatitis A immunization.** / Consult your caregiver. 2 doses, 6 to 18 months apart.  Hepatitis B immunization.** / Consult your caregiver. 3 doses, usually over 6 months. Ages 65 and over  Blood pressure check.** / Every 1 to 2 years.  Lipid and cholesterol check.** / Every 5 years beginning at age 20.  Clinical breast exam.** / Every year after age 40.  Mammogram.** / Every year beginning at age 40 and continuing for as long as you are in good health. Consult with your caregiver.  Pap test.** / Every 3 years starting at age 30 through age 65 or 70 with a 3  consecutive normal Pap tests. Testing can be stopped between 65 and 70 with 3 consecutive normal Pap tests and no abnormal Pap or HPV tests in the past 10 years.  HPV screening.** / Every 3 years from ages 30 through ages 65 or 70 with a history of 3 consecutive normal Pap tests. Testing can be stopped between 65 and 70 with 3 consecutive normal Pap tests and no abnormal Pap or HPV tests in the past 10 years.  Fecal occult blood test (FOBT) of stool. / Every year beginning at age 50 and continuing until age 75. You may not need to do this test if you get a colonoscopy every 10 years.  Flexible sigmoidoscopy or colonoscopy.** / Every 5 years for a flexible sigmoidoscopy or every 10 years for a colonoscopy beginning at age 50 and continuing until age 75.  Hepatitis C blood test.** / For all people born from 1945 through 1965 and any individual with known risks for hepatitis C.  Osteoporosis screening.** / A one-time screening for women ages 65 and over and women at risk for fractures or osteoporosis.  Skin self-exam. / Monthly.  Influenza immunization.** / Every year.  Pneumococcal polysaccharide immunization.** / 1 dose at age 65 (or older) if you have never been vaccinated.  Tetanus, diphtheria, pertussis (Tdap, Td) immunization. / A one-time dose of Tdap vaccine if you are over   65 and have contact with an infant, are a healthcare worker, or simply want to be protected from whooping cough. After that, you need a Td booster dose every 10 years.  Varicella immunization.** / Consult your caregiver.  Meningococcal immunization.** / Consult your caregiver.  Hepatitis A immunization.** / Consult your caregiver. 2 doses, 6 to 18 months apart.  Hepatitis B immunization.** / Check with your caregiver. 3 doses, usually over 6 months. ** Family history and personal history of risk and conditions may change your caregiver's recommendations. Document Released: 01/10/2002 Document Revised: 02/06/2012  Document Reviewed: 04/11/2011 [employer] Patient Information 2013 [employer].  

## 2012-09-09 ENCOUNTER — Encounter: Payer: Self-pay | Admitting: Family Medicine

## 2012-09-09 DIAGNOSIS — Z0000 Encounter for general adult medical examination without abnormal findings: Secondary | ICD-10-CM

## 2012-09-09 DIAGNOSIS — IMO0001 Reserved for inherently not codable concepts without codable children: Secondary | ICD-10-CM

## 2012-09-09 HISTORY — DX: Reserved for inherently not codable concepts without codable children: IMO0001

## 2012-09-09 HISTORY — DX: Encounter for general adult medical examination without abnormal findings: Z00.00

## 2012-09-09 NOTE — Assessment & Plan Note (Signed)
Given flu shot today, encouraged complete smoking cessation, heart healthy diet, adequate exercise and sleep

## 2012-09-09 NOTE — Assessment & Plan Note (Signed)
Improved with recheck, consider DASH diet and we will reassess at next visit.

## 2012-09-09 NOTE — Assessment & Plan Note (Signed)
Given some Transdermal gel to apply up to 4 times daily for pain management. May try moist heat prn as well

## 2012-09-09 NOTE — Progress Notes (Signed)
Patient ID: [name], female   DOB: 09/25/1951, 61 y.o.   MRN: [id number] JAQUIA BENEDICTO [id number] [birth year] 09/09/2012      Progress Note New Patient  Subjective  Chief Complaint  Chief Complaint  Patient presents with  . Annual Exam    physical    HPI  Is a 61 year old Caucasian female who is in today for an annual exam and has a great stress this past year. Her worst health problem was recurrent breast cancer for which she underwent extensive treatment. Her husband struggle to prostate cancer at the same time. They struggle with a strained relationship even at best. She ran of her Wellbutrin several months ago and unfortunately didn't smoke again a couple of cigarettes daily. She is struggling with depression and anxiety she denies suicidal ideation. She did occasionally have bright chest wall pain. Mostly in the axilla and flank secondary to her reconstructive surgery after her mastectomy. She says she has daily pain in 9 days it is very intense. Upgaze is somewhat better. No acute illness or recent fever noted. No other chest pain does occasionally have a sensation of palpitations but denies any shortness of breath, GI or GU complaints at this time. She has not been eating well consistently or exercising regularly  Past Medical History  Diagnosis Date  . Cancer 1998  . Chicken pox   . Left hip pain 05/04/2011  . Colon polyps   . Dyslipidemia   . Tobacco abuse 05/05/2011  . Osteoporosis 02/07/2012  . Depression with anxiety 09/07/2012  . Elevated BP 09/09/2012  . Preventative health care 09/09/2012    Past Surgical History  Procedure Date  . Appendectomy 2010  . Breast lumpectomy 1998    right  . Breast surgery 07/08/11    right mastectomy+axlnd, reconstruction,T1cN1a,ERPR+,HER2-    Family History  Problem Relation Age of Onset  . Diabetes Mother     type 2  . Obesity Mother   . Other Mother   . Cancer Father     ?  Marland Kitchen Other Maternal Grandmother     heart  palpitations  . Other Paternal Grandmother     heart arrythmia/ palpitations    History   Social History  . Marital Status: Married    Spouse Name: N/A    Number of Children: N/A  . Years of Education: N/A   Occupational History  . Not on file.   Social History Main Topics  . Smoking status: Former Games developer  . Smokeless tobacco: Never Used  . Alcohol Use: Yes     occasionally  . Drug Use: No  . Sexually Active: No   Other Topics Concern  . Not on file   Social History Narrative  . No narrative on file    Current Outpatient Prescriptions on File Prior to Visit  Medication Sig Dispense Refill  . anastrozole (ARIMIDEX) 1 MG tablet Take 1 tablet (1 mg total) by mouth daily.  30 tablet  11  . gabapentin (NEURONTIN) 300 MG capsule       . escitalopram (LEXAPRO) 10 MG tablet Take 1 tablet (10 mg total) by mouth daily.  30 tablet  1    No Known Allergies  Review of Systems  Review of Systems  Constitutional: Negative for fever, chills and malaise/fatigue.  HENT: Negative for hearing loss, nosebleeds and congestion.   Eyes: Negative for discharge.  Respiratory: Negative for cough, sputum production, shortness of breath and wheezing.   Cardiovascular: Positive for chest pain. Negative  for palpitations and leg swelling.  Gastrointestinal: Negative for heartburn, nausea, vomiting, abdominal pain, diarrhea, constipation and blood in stool.  Genitourinary: Negative for dysuria, urgency, frequency and hematuria.  Musculoskeletal: Negative for myalgias, back pain and falls.  Skin: Negative for rash.  Neurological: Negative for dizziness, tremors, sensory change, focal weakness, loss of consciousness, weakness and headaches.  Endo/Heme/Allergies: Negative for polydipsia. Does not bruise/bleed easily.  Psychiatric/Behavioral: Positive for depression. Negative for suicidal ideas. The patient is nervous/anxious. The patient does not have insomnia.     Objective  BP 138/92  Pulse  115  Temp 97.6 F (36.4 C) (Temporal)  Ht 5\' 6"  (1.676 m)  Wt 154 lb 12.8 oz (70.217 kg)  BMI 24.99 kg/m2  SpO2 96%  Physical Exam  Physical Exam  Constitutional: She is oriented to person, place, and time and well-developed, well-nourished, and in no distress. No distress.  HENT:  Head: Normocephalic and atraumatic.  Right Ear: External ear normal.  Left Ear: External ear normal.  Nose: Nose normal.  Mouth/Throat: Oropharynx is clear and moist. No oropharyngeal exudate.  Eyes: Conjunctivae normal are normal. Pupils are equal, round, and reactive to light. Right eye exhibits no discharge. Left eye exhibits no discharge. No scleral icterus.  Neck: Normal range of motion. Neck supple. No thyromegaly present.  Cardiovascular: Normal rate, regular rhythm, normal heart sounds and intact distal pulses.   No murmur heard. Pulmonary/Chest: Effort normal and breath sounds normal. No respiratory distress. She has no wheezes. She has no rales.  Abdominal: Soft. Bowel sounds are normal. She exhibits no distension and no mass. There is no tenderness.  Musculoskeletal: Normal range of motion. She exhibits no edema and no tenderness.  Lymphadenopathy:    She has no cervical adenopathy.  Neurological: She is alert and oriented to person, place, and time. She has normal reflexes. No cranial nerve deficit. Coordination normal.  Skin: Skin is warm and dry. No rash noted. She is not diaphoretic.  Psychiatric: Mood, memory and affect normal.       Assessment & Plan  Tobacco abuse Is unfortunately smoking up to 3 cigarettes a day, encouraged complete cessation.   Depression with anxiety Will start Escitalopram 10 mg daily and given a bit, Lorazepam prn will be represcribed  Breast cancer, right breast Has struggled with a recurrence this past year while her husband was being treated for Prostate cancer simultaneously  Chronic chest wall pain Given some Transdermal gel to apply up to 4 times  daily for pain management. May try moist heat prn as well  Elevated BP Improved with recheck, consider DASH diet and we will reassess at next visit.  Preventative health care Given flu shot today, encouraged complete smoking cessation, heart healthy diet, adequate exercise and sleep

## 2012-09-09 NOTE — Assessment & Plan Note (Signed)
Has struggled with a recurrence this past year while her husband was being treated for Prostate cancer simultaneously

## 2012-09-10 ENCOUNTER — Telehealth: Payer: Self-pay | Admitting: Family Medicine

## 2012-09-10 NOTE — Telephone Encounter (Signed)
I informed pt that the Transdermal is an actual RX.Pt states this medication cost to much and would like to know if there is anything else she can take? Please advise?

## 2012-09-10 NOTE — Telephone Encounter (Signed)
She can try OTC Aspercreme first

## 2012-09-10 NOTE — Telephone Encounter (Signed)
No it is not free, I told her I did not know how much it would cost

## 2012-09-10 NOTE — Telephone Encounter (Signed)
Is there anything else she could take?

## 2012-09-10 NOTE — Telephone Encounter (Signed)
Patient informed. 

## 2012-09-18 ENCOUNTER — Other Ambulatory Visit: Payer: 59

## 2012-10-02 ENCOUNTER — Other Ambulatory Visit: Payer: Self-pay | Admitting: Oncology

## 2012-11-22 ENCOUNTER — Other Ambulatory Visit: Payer: Self-pay | Admitting: Family Medicine

## 2012-11-27 ENCOUNTER — Encounter (INDEPENDENT_AMBULATORY_CARE_PROVIDER_SITE_OTHER): Payer: Self-pay | Admitting: General Surgery

## 2012-12-27 ENCOUNTER — Other Ambulatory Visit: Payer: Self-pay | Admitting: Family Medicine

## 2012-12-27 MED ORDER — ESCITALOPRAM OXALATE 10 MG PO TABS: 10.0000 mg | ORAL_TABLET | Freq: Every day | ORAL | Status: DC

## 2012-12-27 NOTE — Telephone Encounter (Signed)
RX sent to [facility]

## 2013-02-08 ENCOUNTER — Telehealth: Payer: Self-pay | Admitting: Oncology

## 2013-02-08 NOTE — Telephone Encounter (Signed)
Pt called today to check the date of her next appt and see if she would have zometa. Pt has lb/AB 3/18. Checked last pof found in 08/14/12 office not and per attached pof pt to have zometa w/her 44mo f/u. Added zometa and pt transferred to nurse to answer questions re having zometa after her recent surgery.

## 2013-02-12 ENCOUNTER — Telehealth: Payer: Self-pay | Admitting: Oncology

## 2013-02-12 ENCOUNTER — Ambulatory Visit (HOSPITAL_BASED_OUTPATIENT_CLINIC_OR_DEPARTMENT_OTHER): Payer: 59

## 2013-02-12 ENCOUNTER — Ambulatory Visit (HOSPITAL_BASED_OUTPATIENT_CLINIC_OR_DEPARTMENT_OTHER): Payer: 59 | Admitting: Physician Assistant

## 2013-02-12 ENCOUNTER — Other Ambulatory Visit (HOSPITAL_BASED_OUTPATIENT_CLINIC_OR_DEPARTMENT_OTHER): Payer: 59 | Admitting: Lab

## 2013-02-12 ENCOUNTER — Encounter: Payer: Self-pay | Admitting: Physician Assistant

## 2013-02-12 VITALS — BP 125/86 | HR 93 | Temp 98.5°F | Resp 20 | Ht 66.0 in | Wt 157.8 lb

## 2013-02-12 DIAGNOSIS — M810 Age-related osteoporosis without current pathological fracture: Secondary | ICD-10-CM

## 2013-02-12 DIAGNOSIS — C50911 Malignant neoplasm of unspecified site of right female breast: Secondary | ICD-10-CM

## 2013-02-12 DIAGNOSIS — C773 Secondary and unspecified malignant neoplasm of axilla and upper limb lymph nodes: Secondary | ICD-10-CM

## 2013-02-12 DIAGNOSIS — Z170 Estrogen receptor positive status [ER+]: Secondary | ICD-10-CM

## 2013-02-12 DIAGNOSIS — C50519 Malignant neoplasm of lower-outer quadrant of unspecified female breast: Secondary | ICD-10-CM

## 2013-02-12 DIAGNOSIS — G8929 Other chronic pain: Secondary | ICD-10-CM

## 2013-02-12 DIAGNOSIS — Z853 Personal history of malignant neoplasm of breast: Secondary | ICD-10-CM

## 2013-02-12 LAB — CBC WITH DIFFERENTIAL/PLATELET
EOS%: 1.6 % (ref 0.0–7.0)
Eosinophils Absolute: 0.1 10*3/uL (ref 0.0–0.5)
LYMPH%: 25.9 % (ref 14.0–49.7)
MCH: 30.5 pg (ref 25.1–34.0)
MCHC: 34.1 g/dL (ref 31.5–36.0)
MCV: 89.4 fL (ref 79.5–101.0)
MONO%: 6.3 % (ref 0.0–14.0)
NEUT#: 5.9 10*3/uL (ref 1.5–6.5)
Platelets: 317 10*3/uL (ref 145–400)
RBC: 4.35 10*6/uL (ref 3.70–5.45)

## 2013-02-12 LAB — COMPREHENSIVE METABOLIC PANEL (CC13)
ALT: 28 U/L (ref 0–55)
AST: 20 U/L (ref 5–34)
Albumin: 3.6 g/dL (ref 3.5–5.0)
Alkaline Phosphatase: 59 U/L (ref 40–150)
Calcium: 10 mg/dL (ref 8.4–10.4)
Chloride: 103 mEq/L (ref 98–107)
Potassium: 3.9 mEq/L (ref 3.5–5.1)

## 2013-02-12 MED ORDER — ZOLEDRONIC ACID 4 MG/100ML IV SOLN
4.0000 mg | Freq: Once | INTRAVENOUS | Status: AC
  Administered 2013-02-12: 4 mg via INTRAVENOUS
  Filled 2013-02-12: qty 100

## 2013-02-12 NOTE — Progress Notes (Signed)
ID: [name]   DOB: 10/20/1951  MR#: [id number]  CSN#:[id number]  PCP: Brittany Edge, MD GYN:  SU:  OTHER MD:   HISTORY OF PRESENT ILLNESS: The patient formally saw Dr. Donnie Mitchell for a remote history of right breast cancer status post lumpectomy and axillary lymph node dissection in August 1998.  This was a T1c N0, estrogen and progesterone receptor positive tumor, which was treated adjuvantly with doxorubicin and cyclophosphamide x4 followed by adjuvant radiation completed in March 1998 followed by 5 years of tamoxifen completed in January 2004.  More recently, screening mammography at [name], June 2012, found a possible abnormality in the right breast.  The patient was brought back for additional views the next day and additional spot compression views were obtained, with the report not available in our system.  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   With this information, the patient was referred for bilateral breast MRIs, May 15, 2011.  In addition to the prior lumpectomy changes, there was an enhancing irregular mass in the lower-outer quadrant of the right breast measuring 1.6 cm.  The rest of the MRI exam was unremarkable except for a mildly prominent right axillary lymph node.  With this information, the patient was presented at the [facility] and the recommendation was made for mastectomy with an Oncotype to be sent at the same time.   The patient did undergo right mastectomy, Dr. Carolynne Mitchell, with sentinel lymph node biopsy, and immediate  reconstruction with a saline implant and right latissimus myocutaneous flap under [name], August 10th.  The pathology from this procedure ([id number]) confirmed a well-differentiated invasive ductal carcinoma measuring 1.5 cm with ample margins and 1/6 lymph nodes involved.  HER2 was repeated and was again negative.   With this information, the patient was seen by Dr. Donnie Mitchell and started on anastrozole as well as Rivka Barbara given her history of osteoporosis.  She has also been seen by Genetics and had an Oncotype DX as noted below. She established herself in my service October 2012.  INTERVAL HISTORY: [name] returns today for followup of her right breast cancer. She continues on anastrozole, although she admits she has not been taking that drug for approximately 2-3 months. She reports no significant side effects, but just "has not been good about taking it". She is also due for her next every 6 month dose of zoledronic acid for osteoporosis.  Interval history is remarkable for revision of her right reconstruction one week ago. She continues to be followed by Dr. Odis Mitchell, and is scheduled to see him again tomorrow. She's had no complications from the surgery. She does have some residual pain which is well-controlled.   REVIEW OF SYSTEMS: [name] denies any recent illnesses and has had no fevers or chills. She has occasional hot flashes, but these are much better while taking gabapentin. Her energy level is fair. She's eating and drinking well denies any problems with nausea. Some constipation following surgery last week, but this has resolved. [name] denies any cough, phlegm production, shortness of breath, or chest pain. She does have some postoperative discomfort, described as "a  tight band" in the right chest wall and right axilla. This is not new, and has not changed or worsened. [name] denies any additional myalgias, arthralgias, or bony pain. She's had no peripheral swelling. She denies any jaw pain, dental  procedures, or extractions.   Otherwise a detailed review of systems today was noncontributory  PAST MEDICAL HISTORY: Past Medical History  Diagnosis Date  . Cancer 1998  . Chicken pox   . Left hip pain 05/04/2011  . Colon polyps   . Dyslipidemia   . Tobacco abuse 05/05/2011  . Osteoporosis 02/07/2012  . Depression with anxiety 09/07/2012  . Elevated BP 09/09/2012  . Preventative health care 09/09/2012  Significant for appendectomy remotely, history of osteopenia/osteoporosis, history of dyslipidemia, and history of 15 pack-year tobacco abuse, the patient quitting approximately 3 months ago.  PAST SURGICAL HISTORY: Past Surgical History  Procedure Laterality Date  . Appendectomy  2010  . Breast lumpectomy  1998    right  . Breast surgery  07/08/11    right mastectomy+axlnd, reconstruction,T1cN1a,ERPR+,HER2-    FAMILY HISTORY Family History  Problem Relation Age of Onset  . Diabetes Mother     type 2  . Obesity Mother   . Other Mother   . Cancer Father     ?  Marland Kitchen Other Maternal Grandmother     Brittany palpitations  . Other Paternal Grandmother     Brittany arrythmia/ palpitations  This is essentially noncontributory.  The patient's father did die from cancer but they do not know the primary.  It seems to have been most likely a lung cancer that was widely metastatic at diagnosis.  He was 62 years old at that time.  Otherwise, there is no history of breast or ovarian cancer in the patient's family  GYNECOLOGIC HISTORY: She had menarche, age 60.  Menopause at age 36.  She is GX, P2.  First pregnancy to term, age 70.  She never took hormone replacement.  SOCIAL HISTORY: She works in Airline pilot.  Her husband [name] has a history of prostate cancer, was in Holiday representative but is now retired.  They have 2 children, a son and a daughter, both living in this area and 2 grandchildren.  The patient is not a church attender.   ADVANCED DIRECTIVES:  HEALTH MAINTENANCE: History   Substance Use Topics  . Smoking status: Former Games developer  . Smokeless tobacco: Never Used  . Alcohol Use: Yes     Comment: occasionally  As noted, the patient has a 15 pack-year tobacco abuse history but quit 3 months ago.  There is no history of alcohol abuse.  She is up-to-date on colonoscopies which she gets through the [facility].  She had a bone density in October 2007, which showed osteopenia and a repeat in January 2011, which showed osteoporosis with a T-score of -2.7 in the right femoral neck.  The patient has mild dyslipidemia which is being treated with diet.  She is up-to-date on her Pap smears through Dr. Scherrie November.   Colonoscopy:  PAP:  Bone density:  Lipid panel:  No Known Allergies  Current Outpatient Prescriptions  Medication Sig Dispense Refill  . diclofenac sodium (VOLTAREN) 1 % GEL Apply topically. Transdermal myofascial gel- Ketamine 10%, Baclofen 2%, Cyclobenzaprine 2%, Gabapentin 6%, Bupivacaine 2%      . escitalopram (LEXAPRO) 10 MG tablet Take 1 tablet (10 mg total) by mouth daily.  30 tablet  3  . gabapentin (NEURONTIN) 300 MG capsule TAKE ONE CAPSULE BY MOUTH AT  BEDTIME  30 capsule  8  . LORazepam (ATIVAN) 0.5 MG tablet Take 1 tablet (0.5 mg total) by mouth 2 (two) times daily as needed for anxiety.  30 tablet  1  . anastrozole (ARIMIDEX) 1 MG tablet Take 1 tablet (1 mg total) by mouth daily.  30 tablet  11   No current facility-administered medications for this visit.    OBJECTIVE: Middle-aged white woman in no acute distress Filed Vitals:   02/12/13 1257  BP: 125/86  Pulse: 93  Temp: 98.5 F (36.9 C)  Resp: 20     Body mass index is 25.48 kg/(m^2).    ECOG FS: 0 Filed Weights   02/12/13 1257  Weight: 157 lb 12.8 oz (71.578 kg)   Sclerae unicteric Oropharynx clear No cervical or supraclavicular adenopathy Lungs clear to auscultation bilaterally with no rales or rhonchi Brittany regular rate and rhythm Abdomen soft, nontender to palpation with  positive bowel sounds MSK no focal spinal tenderness, no peripheral edema Neuro: nonfocal, well oriented with positive affect Breasts: The right breast is status post mastectomy and reconstruction, with recent revision. Well-healing incision with no evidence of drainage or infection.. There is no evidence of local recurrence. The left breast is unremarkable. Axillae are benign bilaterally palpable adenopathy.   LAB RESULTS: Lab Results  Component Value Date   WBC 9.0 02/12/2013   NEUTROABS 5.9 02/12/2013   HGB 13.3 02/12/2013   HCT 38.9 02/12/2013   MCV 89.4 02/12/2013   PLT 317 02/12/2013      Chemistry      Component Value Date/Time   NA 140 02/12/2013 1234   NA 140 02/07/2012 0908   K 3.9 02/12/2013 1234   K 4.0 02/07/2012 0908   CL 103 02/12/2013 1234   CL 107 02/07/2012 0908   CO2 29 02/12/2013 1234   CO2 25 02/07/2012 0908   BUN 14.5 02/12/2013 1234   BUN 17 02/07/2012 0908   CREATININE 0.7 02/12/2013 1234   CREATININE 0.58 02/07/2012 0908      Component Value Date/Time   CALCIUM 10.0 02/12/2013 1234   CALCIUM 9.2 02/07/2012 0908   ALKPHOS 59 02/12/2013 1234   ALKPHOS 60 02/07/2012 0908   AST 20 02/12/2013 1234   AST 15 02/07/2012 0908   ALT 28 02/12/2013 1234   ALT 19 02/07/2012 0908   BILITOT 0.21 02/12/2013 1234   BILITOT 0.3 02/07/2012 0908       Lab Results  Component Value Date   LABCA2 22 08/07/2012     STUDIES: Mammography July 2013 was benign.  Most recent bone density was in January 2011 at [facility], showing osteoporosis.   ASSESSMENT: 62 y.o. [facility] woman status post:  (1) Right lumpectomy and axillary lymph node dissection  1998, for a T1c N0, estrogen receptor positive breast cancer, treated adjuvantly with doxorubicin and cyclophosphamide times 4, followed by radiation then tamoxifen for 5 years   (2) Right mastectomy with immediate implant and latissimus flap reconstruction, August 2012, for a T1c N1, grade 1, invasive ductal carcinoma, estrogen and  progesterone receptor positive, HER2 negative, with an MIB1 of 13%.  (3) her Oncotype DX  recurrence score was 14 predicting for a 9% risk of distant recurrence within 10 years if she took 5 years of tamoxifen.    (4) on anastrozole as of November 2012  (5) with a history of osteoporosis, on denosumab initially, switched to zolendronic acid march 2013 because of possible further reduction in breast cancer recurrence, to continue every 6  months for 2 years, then yearly while on anastrozole  PLAN:  Shacara continues to do well with regards to her breast cancer, with no clinical evidence of disease recurrence. She plans to restart her anastrozole, but will call if she has any side effects.  The plan is to continue anastrozole for a minimum of 5 years. She is scheduled for her next zoledronic acid today and the plan is to treat again in 6 months, then go to an annual basis. She'll see Dr. Darnelle Catalan when she returns in 6 months, September 2014. In the meanwhile, she is also due to have her bone density repeated, and will be due for her left mammogram at [facility] in July. All this is being scheduled accordingly.  [name] voices understanding and agreement with this plan, and will call any changes or problems.    [name]    02/12/2013

## 2013-02-12 NOTE — Telephone Encounter (Signed)
gv pt appt schedule for september and appts for bone density 3/25 @ 2:30pm @ [facility] and mammo 06/10/13 @ 2:30 pm @ [facility].

## 2013-02-12 NOTE — Patient Instructions (Addendum)
Zoledronic Acid injection (Hypercalcemia, Oncology) What is this medicine? ZOLEDRONIC ACID (ZOE le dron ik AS id) lowers the amount of calcium loss from bone. It is used to treat too much calcium in your blood from cancer. It is also used to prevent complications of cancer that has spread to the bone. This medicine may be used for other purposes; ask your health care provider or pharmacist if you have questions. What should I tell my health care provider before I take this medicine? They need to know if you have any of these conditions: -aspirin-sensitive asthma -dental disease -kidney disease -an unusual or allergic reaction to zoledronic acid, other medicines, foods, dyes, or preservatives -pregnant or trying to get pregnant -breast-feeding How should I use this medicine? This medicine is for infusion into a vein. It is given by a health care professional in a hospital or clinic setting. Talk to your pediatrician regarding the use of this medicine in children. Special care may be needed. Overdosage: If you think you have taken too much of this medicine contact a poison control center or emergency room at once. NOTE: This medicine is only for you. Do not share this medicine with others. What if I miss a dose? It is important not to miss your dose. Call your doctor or health care professional if you are unable to keep an appointment. What may interact with this medicine? -certain antibiotics given by injection -NSAIDs, medicines for pain and inflammation, like ibuprofen or naproxen -some diuretics like bumetanide, furosemide -teriparatide -thalidomide This list may not describe all possible interactions. Give your health care provider a list of all the medicines, herbs, non-prescription drugs, or dietary supplements you use. Also tell them if you smoke, drink alcohol, or use illegal drugs. Some items may interact with your medicine. What should I watch for while using this medicine? Visit  your doctor or health care professional for regular checkups. It may be some time before you see the benefit from this medicine. Do not stop taking your medicine unless your doctor tells you to. Your doctor may order blood tests or other tests to see how you are doing. Women should inform their doctor if they wish to become pregnant or think they might be pregnant. There is a potential for serious side effects to an unborn child. Talk to your health care professional or pharmacist for more information. You should make sure that you get enough calcium and vitamin D while you are taking this medicine. Discuss the foods you eat and the vitamins you take with your health care professional. Some people who take this medicine have severe bone, joint, and/or muscle pain. This medicine may also increase your risk for a broken thigh bone. Tell your doctor right away if you have pain in your upper leg or groin. Tell your doctor if you have any pain that does not go away or that gets worse. What side effects may I notice from receiving this medicine? Side effects that you should report to your doctor or health care professional as soon as possible: -allergic reactions like skin rash, itching or hives, swelling of the face, lips, or tongue -anxiety, confusion, or depression -breathing problems -changes in vision -feeling faint or lightheaded, falls -jaw burning, cramping, pain -muscle cramps, stiffness, or weakness -trouble passing urine or change in the amount of urine Side effects that usually do not require medical attention (report to your doctor or health care professional if they continue or are bothersome): -bone, joint, or muscle pain -  fever -hair loss -irritation at site where injected -loss of appetite -nausea, vomiting -stomach upset -tired This list may not describe all possible side effects. Call your doctor for medical advice about side effects. You may report side effects to FDA at  1-800-FDA-1088. Where should I keep my medicine? This drug is given in a hospital or clinic and will not be stored at home. NOTE: This sheet is a summary. It may not cover all possible information. If you have questions about this medicine, talk to your doctor, pharmacist, or health care provider.  2013, Elsevier/Gold Standard. (05/13/2011 9:06:58 AM)  

## 2013-02-25 ENCOUNTER — Encounter: Payer: Self-pay | Admitting: Gynecology

## 2013-03-01 ENCOUNTER — Other Ambulatory Visit: Payer: Self-pay | Admitting: Oncology

## 2013-03-01 ENCOUNTER — Telehealth: Payer: Self-pay

## 2013-03-01 NOTE — Telephone Encounter (Signed)
I informed pt per md that osteoporosis continues but improved to continue what she is doing. Pt voiced understanding

## 2013-03-04 ENCOUNTER — Other Ambulatory Visit: Payer: Self-pay | Admitting: *Deleted

## 2013-03-04 DIAGNOSIS — C50911 Malignant neoplasm of unspecified site of right female breast: Secondary | ICD-10-CM

## 2013-03-04 MED ORDER — ANASTROZOLE 1 MG PO TABS
1.0000 mg | ORAL_TABLET | Freq: Every day | ORAL | Status: DC
Start: 2013-03-04 — End: 2013-08-19

## 2013-03-07 ENCOUNTER — Encounter: Payer: Self-pay | Admitting: Gastroenterology

## 2013-03-19 ENCOUNTER — Encounter: Payer: Self-pay | Admitting: Gastroenterology

## 2013-04-04 ENCOUNTER — Telehealth: Payer: Self-pay | Admitting: Family Medicine

## 2013-04-04 NOTE — Telephone Encounter (Signed)
OK to refill Celebrex 200 mg po dialy prn pain, disp #30 2 rf

## 2013-04-04 NOTE — Telephone Encounter (Signed)
Please advise Celebrex RX? I don't see where pt is still taking this?

## 2013-04-05 MED ORDER — CELECOXIB 200 MG PO CAPS: 200.0000 mg | ORAL_CAPSULE | Freq: Every day | ORAL | Status: DC | PRN

## 2013-04-25 ENCOUNTER — Other Ambulatory Visit: Payer: Self-pay | Admitting: Family Medicine

## 2013-04-25 NOTE — Telephone Encounter (Signed)
Refill request for Lexapro Last filled by MD on -12/27/12 #30 x3 Last seen- 09/07/12 F/U-none Please advise refill?

## 2013-04-26 ENCOUNTER — Other Ambulatory Visit: Payer: Self-pay | Admitting: Physician Assistant

## 2013-04-30 ENCOUNTER — Other Ambulatory Visit: Payer: Self-pay | Admitting: Physician Assistant

## 2013-05-09 ENCOUNTER — Ambulatory Visit (AMBULATORY_SURGERY_CENTER): Payer: 59 | Admitting: *Deleted

## 2013-05-09 VITALS — Ht 66.0 in | Wt 158.8 lb

## 2013-05-09 DIAGNOSIS — Z1211 Encounter for screening for malignant neoplasm of colon: Secondary | ICD-10-CM

## 2013-05-09 MED ORDER — MOVIPREP 100 G PO SOLR: ORAL | Status: DC

## 2013-05-13 ENCOUNTER — Encounter: Payer: Self-pay | Admitting: Gastroenterology

## 2013-05-23 ENCOUNTER — Encounter: Payer: Self-pay | Admitting: Gastroenterology

## 2013-05-23 ENCOUNTER — Ambulatory Visit (AMBULATORY_SURGERY_CENTER): Payer: 59 | Admitting: Gastroenterology

## 2013-05-23 VITALS — BP 149/75 | HR 58 | Temp 96.7°F | Resp 19 | Ht 66.0 in | Wt 158.0 lb

## 2013-05-23 DIAGNOSIS — D128 Benign neoplasm of rectum: Secondary | ICD-10-CM

## 2013-05-23 DIAGNOSIS — D126 Benign neoplasm of colon, unspecified: Secondary | ICD-10-CM

## 2013-05-23 DIAGNOSIS — K635 Polyp of colon: Secondary | ICD-10-CM

## 2013-05-23 DIAGNOSIS — Z1211 Encounter for screening for malignant neoplasm of colon: Secondary | ICD-10-CM

## 2013-05-23 DIAGNOSIS — Z86010 Personal history of colonic polyps: Secondary | ICD-10-CM

## 2013-05-23 DIAGNOSIS — D129 Benign neoplasm of anus and anal canal: Secondary | ICD-10-CM

## 2013-05-23 MED ORDER — SODIUM CHLORIDE 0.9 % IV SOLN: 500.0000 mL | INTRAVENOUS | Status: DC

## 2013-05-23 NOTE — Progress Notes (Signed)
Lidocaine-40mg IV prior to Propofol InductionPropofol given over incremental dosages 

## 2013-05-23 NOTE — Op Note (Signed)
[facility] [address] [place], [address]   COLONOSCOPY PROCEDURE REPORT  PATIENT: [name], [name]  MR#: [id number] BIRTHDATE: Jun 15, 1951 , 62  yrs. old GENDER: Female ENDOSCOPIST: Meryl Dare, MD, [facility] PROCEDURE DATE:  05/23/2013 PROCEDURE:   Colonoscopy with biopsy and snare polypectomy ASA CLASS:   Class II INDICATIONS:Patient's personal history of adenomatous colon polyps and Last colonoscopy performed 3 years ago. MEDICATIONS: MAC sedation, administered by CRNA and propofol (Diprivan) 300mg  IV DESCRIPTION OF PROCEDURE:   After the risks benefits and alternatives of the procedure were thoroughly explained, informed consent was obtained.  A digital rectal exam revealed no abnormalities of the rectum.   The LB [id number]  endoscope was introduced through the anus and advanced to the cecum, which was identified by both the appendix and ileocecal valve. No adverse events experienced.   The quality of the prep was good, using MoviPrep  The instrument was then slowly withdrawn as the colon was fully examined.  COLON FINDINGS: A sessile polyp measuring 6 mm in size was found in the ascending colon.  A polypectomy was performed with a cold snare.  The resection was complete and the polyp tissue was completely retrieved.   Two sessile polyps measuring 4 mm in size were found in the rectum.  A polypectomy was performed with cold forceps.  The resection was complete and the polyp tissue was completely retrieved.   Moderate diverticulosis was noted in the descending colon and sigmoid colon.   The colon was otherwise normal.  There was no diverticulosis, inflammation, polyps or cancers unless previously stated.  Retroflexed views revealed small internal hemorrhoids. The time to cecum=3 minutes 06 seconds. Withdrawal time=10 minutes 24 seconds.  The scope was withdrawn and the procedure completed. COMPLICATIONS: There were no  complications.  ENDOSCOPIC IMPRESSION: 1.   Sessile polyp measuring 6 mm in the ascending colon; polypectomy performed with a cold snare 2.   Two sessile polyps measuring 4 mm in the rectum; polypectomy performed with cold forceps 3.   Moderate diverticulosis was noted in the descending colon and sigmoid colon 4.   Small internal hemorrhoids  RECOMMENDATIONS: 1.  Await pathology results 2.  High fiber diet with liberal fluid intake. 3.  Repeat Colonoscopy in 5 years.  eSigned:  Meryl Dare, MD, [facility] 05/23/2013 9:24 AM

## 2013-05-23 NOTE — Patient Instructions (Signed)
YOU HAD AN ENDOSCOPIC PROCEDURE TODAY AT THE [facility]: Refer to the procedure report that was given to you for any specific questions about what was found during the examination.  If the procedure report does not answer your questions, please call your gastroenterologist to clarify.  If you requested that your care partner not be given the details of your procedure findings, then the procedure report has been included in a sealed envelope for you to review at your convenience later.  YOU SHOULD EXPECT: Some feelings of bloating in the abdomen. Passage of more gas than usual.  Walking can help get rid of the air that was put into your GI tract during the procedure and reduce the bloating. If you had a lower endoscopy (such as a colonoscopy or flexible sigmoidoscopy) you may notice spotting of blood in your stool or on the toilet paper. If you underwent a bowel prep for your procedure, then you may not have a normal bowel movement for a few days.  DIET: Your first meal following the procedure should be a light meal and then it is ok to progress to your normal diet.  A half-sandwich or bowl of soup is an example of a good first meal.  Heavy or fried foods are harder to digest and may make you feel nauseous or bloated.  Likewise meals heavy in dairy and vegetables can cause extra gas to form and this can also increase the bloating.  Drink plenty of fluids but you should avoid alcoholic beverages for 24 hours.  ACTIVITY: Your care partner should take you home directly after the procedure.  You should plan to take it easy, moving slowly for the rest of the day.  You can resume normal activity the day after the procedure however you should NOT DRIVE or use heavy machinery for 24 hours (because of the sedation medicines used during the test).    SYMPTOMS TO REPORT IMMEDIATELY: A gastroenterologist can be reached at any hour.  During normal business hours, 8:30 AM to 5:00 PM Monday through Friday,  call [phone].  After hours and on weekends, please call the GI answering service at [phone] who will take a message and have the physician on call contact you.   Following lower endoscopy (colonoscopy or flexible sigmoidoscopy):  Excessive amounts of blood in the stool  Significant tenderness or worsening of abdominal pains  Swelling of the abdomen that is new, acute  Fever of 100F or higher    FOLLOW UP: If any biopsies were taken you will be contacted by phone or by letter within the next 1-3 weeks.  Call your gastroenterologist if you have not heard about the biopsies in 3 weeks.  Our staff will call the home number listed on your records the next business day following your procedure to check on you and address any questions or concerns that you may have at that time regarding the information given to you following your procedure. This is a courtesy call and so if there is no answer at the home number and we have not heard from you through the emergency physician on call, we will assume that you have returned to your regular daily activities without incident.  SIGNATURES/CONFIDENTIALITY: You and/or your care partner have signed paperwork which will be entered into your electronic medical record.  These signatures attest to the fact that that the information above on your After Visit Summary has been reviewed and is understood.  Full responsibility of the confidentiality   of this discharge information lies with you and/or your care-partner.     

## 2013-05-23 NOTE — Progress Notes (Signed)
Patient did not experience any of the following events: a burn prior to discharge; a fall within the facility; wrong site/side/patient/procedure/implant event; or a hospital transfer or hospital admission upon discharge from the facility. (G8907) Patient did not have preoperative order for IV antibiotic SSI prophylaxis. (G8918)  

## 2013-05-24 ENCOUNTER — Telehealth: Payer: Self-pay

## 2013-05-24 NOTE — Telephone Encounter (Signed)
  Follow up Call-  Call back number 05/23/2013  Post procedure Call Back phone  # [phone] hm  Permission to leave phone message Yes     Patient questions:  Do you have a fever, pain , or abdominal swelling? no Pain Score  0 *  Have you tolerated food without any problems? yes  Have you been able to return to your normal activities? yes  Do you have any questions about your discharge instructions: Diet   no Medications  no Follow up visit  no  Do you have questions or concerns about your Care? no  Actions: * If pain score is 4 or above: No action needed, pain <4.

## 2013-06-03 ENCOUNTER — Encounter: Payer: Self-pay | Admitting: Gastroenterology

## 2013-08-02 ENCOUNTER — Other Ambulatory Visit: Payer: Self-pay | Admitting: Family Medicine

## 2013-08-14 ENCOUNTER — Other Ambulatory Visit (HOSPITAL_BASED_OUTPATIENT_CLINIC_OR_DEPARTMENT_OTHER): Payer: 59 | Admitting: Lab

## 2013-08-14 DIAGNOSIS — C50911 Malignant neoplasm of unspecified site of right female breast: Secondary | ICD-10-CM

## 2013-08-14 DIAGNOSIS — C50519 Malignant neoplasm of lower-outer quadrant of unspecified female breast: Secondary | ICD-10-CM

## 2013-08-14 DIAGNOSIS — M810 Age-related osteoporosis without current pathological fracture: Secondary | ICD-10-CM

## 2013-08-14 LAB — CBC WITH DIFFERENTIAL/PLATELET
BASO%: 0.8 % (ref 0.0–2.0)
Eosinophils Absolute: 0.1 10*3/uL (ref 0.0–0.5)
MONO#: 0.4 10*3/uL (ref 0.1–0.9)
MONO%: 5.2 % (ref 0.0–14.0)
NEUT#: 5.8 10*3/uL (ref 1.5–6.5)
RBC: 4.32 10*6/uL (ref 3.70–5.45)
RDW: 13.7 % (ref 11.2–14.5)
WBC: 8.3 10*3/uL (ref 3.9–10.3)

## 2013-08-14 LAB — COMPREHENSIVE METABOLIC PANEL (CC13)
ALT: 16 U/L (ref 0–55)
Albumin: 3.8 g/dL (ref 3.5–5.0)
Alkaline Phosphatase: 62 U/L (ref 40–150)
CO2: 30 mEq/L — ABNORMAL HIGH (ref 22–29)
Glucose: 109 mg/dl (ref 70–140)
Potassium: 3.3 mEq/L — ABNORMAL LOW (ref 3.5–5.1)
Sodium: 142 mEq/L (ref 136–145)
Total Protein: 7.5 g/dL (ref 6.4–8.3)

## 2013-08-19 ENCOUNTER — Encounter: Payer: Self-pay | Admitting: Physician Assistant

## 2013-08-19 ENCOUNTER — Ambulatory Visit (HOSPITAL_BASED_OUTPATIENT_CLINIC_OR_DEPARTMENT_OTHER): Payer: 59

## 2013-08-19 ENCOUNTER — Other Ambulatory Visit: Payer: 59 | Admitting: Lab

## 2013-08-19 ENCOUNTER — Ambulatory Visit: Payer: 59 | Admitting: Oncology

## 2013-08-19 ENCOUNTER — Ambulatory Visit (HOSPITAL_BASED_OUTPATIENT_CLINIC_OR_DEPARTMENT_OTHER): Payer: 59 | Admitting: Physician Assistant

## 2013-08-19 VITALS — BP 147/76 | HR 75 | Temp 98.2°F | Resp 18 | Ht 66.0 in | Wt 157.3 lb

## 2013-08-19 DIAGNOSIS — C50919 Malignant neoplasm of unspecified site of unspecified female breast: Secondary | ICD-10-CM

## 2013-08-19 DIAGNOSIS — M810 Age-related osteoporosis without current pathological fracture: Secondary | ICD-10-CM

## 2013-08-19 DIAGNOSIS — C50911 Malignant neoplasm of unspecified site of right female breast: Secondary | ICD-10-CM

## 2013-08-19 DIAGNOSIS — R071 Chest pain on breathing: Secondary | ICD-10-CM

## 2013-08-19 DIAGNOSIS — E876 Hypokalemia: Secondary | ICD-10-CM | POA: Insufficient documentation

## 2013-08-19 DIAGNOSIS — G8929 Other chronic pain: Secondary | ICD-10-CM

## 2013-08-19 MED ORDER — ZOLEDRONIC ACID 4 MG/5ML IV CONC: 4.0000 mg | Freq: Once | INTRAVENOUS | Status: DC

## 2013-08-19 MED ORDER — ZOLEDRONIC ACID 4 MG/100ML IV SOLN
4.0000 mg | Freq: Once | INTRAVENOUS | Status: AC
  Administered 2013-08-19: 4 mg via INTRAVENOUS
  Filled 2013-08-19: qty 100

## 2013-08-19 MED ORDER — ANASTROZOLE 1 MG PO TABS: 1.0000 mg | ORAL_TABLET | Freq: Every day | ORAL | Status: DC

## 2013-08-19 MED ORDER — SODIUM CHLORIDE 0.9 % IV SOLN
Freq: Once | INTRAVENOUS | Status: AC
  Administered 2013-08-19: 16:00:00 via INTRAVENOUS

## 2013-08-19 MED ORDER — POTASSIUM CHLORIDE ER 10 MEQ PO CPCR: 10.0000 meq | ORAL_CAPSULE | Freq: Two times a day (BID) | ORAL | Status: DC

## 2013-08-19 NOTE — Progress Notes (Signed)
ID: [name]   DOB: 10-Oct-1951  MR#: [id number]  CSN#:[id number]  PCP: Danise Edge, MD GYN:  SU:  OTHER MD:   HISTORY OF PRESENT ILLNESS: The patient formally saw Dr. Donnie Coffin for a remote history of right breast cancer status post lumpectomy and axillary lymph node dissection in August 1998.  This was a T1c N0, estrogen and progesterone receptor positive tumor, which was treated adjuvantly with doxorubicin and cyclophosphamide x4 followed by adjuvant radiation completed in March 1998 followed by 5 years of tamoxifen completed in January 2004.  More recently, screening mammography at [facility], June 2012, found a possible abnormality in the right breast.  The patient was brought back for additional views the next day and additional spot compression views were obtained, with the report not available in our system.  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   With this information, the patient was referred for bilateral breast MRIs, May 15, 2011.  In addition to the prior lumpectomy changes, there was an enhancing irregular mass in the lower-outer quadrant of the right breast measuring 1.6 cm.  The rest of the MRI exam was unremarkable except for a mildly prominent right axillary lymph node.  With this information, the patient was presented at the Multidisciplinary Clinic and the recommendation was made for mastectomy with an Oncotype to be sent at the same time.   The patient did undergo right mastectomy, Dr. Carolynne Edouard, with sentinel lymph node biopsy, and immediate  reconstruction with a saline implant and right latissimus myocutaneous flap under Etter Sjogren, August 10th.  The pathology from this procedure ([id number]) confirmed a well-differentiated invasive ductal carcinoma measuring 1.5 cm with ample margins and 1/6 lymph nodes involved.  HER2 was repeated and was again negative.   With this information, the patient was seen by Dr. Donnie Coffin and started on anastrozole as well as Rivka Barbara given her history of osteoporosis.  She has also been seen by Genetics and had an Oncotype DX as noted below. She established herself in my service October 2012.  INTERVAL HISTORY: [name] returns alone today for followup of her right breast cancer. She continues on anastrozole, which she has been taking consistently on daily basis and seeing me last March. She also received zoledronic every 6 months, last given 02/12/2013 and due again today. A recent bone density at [facility] showed continued osteoporosis, but with improvement in bone density since her previous study.   [name] herself is doing well. She spent much of the summer at their place in [place]. She tells me she stays very busy, although she admits that she is not exercising on a regular basis. We did discuss the importance of doing so with her history of osteoporosis.  [name] is tolerating the anastrozole well. She has occasional hot flashes, but nothing she would consider problematic. She continues on gabapentin which seems to be helpful. She's had no increased joint pain. She denies any increased vaginal dryness.    REVIEW OF SYSTEMS: [name] denies any recent illnesses and has had no fevers or chills. She complains of continued fatigue. She's eating and drinking well denies  any problems with nausea or emesis, no change in bowel or bladder habits.[name] denies any cough, phlegm production, shortness of breath, palpitations or chest pain. She continues to have some postsurgical pain in the right chest wall and right axillary  region, and this is stable. She denies any new myalgias, arthralgias, or bony pain. She's had no peripheral swelling. She also denies any jaw pain, recent dental procedures, or extractions.  Otherwise a detailed review of systems today was noncontributory  PAST MEDICAL HISTORY: Past Medical History  Diagnosis Date  . Cancer 4098,1191  . Chicken pox   . Left hip pain 05/04/2011  . Colon polyps   . Dyslipidemia   . Tobacco abuse 05/05/2011  . Osteoporosis 02/07/2012  . Depression with anxiety 09/07/2012  . Elevated BP 09/09/2012  . Preventative health care 09/09/2012  Significant for appendectomy remotely, history of osteopenia/osteoporosis, history of dyslipidemia, and history of 15 pack-year tobacco abuse.   PAST SURGICAL HISTORY: Past Surgical History  Procedure Laterality Date  . Appendectomy  2010  . Breast lumpectomy  1998    right  . Breast surgery  07/08/11    right mastectomy+axlnd, reconstruction,T1cN1a,ERPR+,HER2-  . Breast surgery  march,2013    breast implant deflated    FAMILY HISTORY Family History  Problem Relation Age of Onset  . Diabetes Mother     type 2  . Obesity Mother   . Other Mother   . Cancer Father     ?  Marland Kitchen Other Maternal Grandmother     heart palpitations  . Other Paternal Grandmother     heart arrythmia/ palpitations  . Colon cancer Neg Hx   . Esophageal cancer Neg Hx   . Rectal cancer Neg Hx   . Stomach cancer Neg Hx   This is essentially noncontributory.  The patient's father did die from cancer but they do not know the primary.  It seems to have been most likely a lung cancer that was widely metastatic at diagnosis.  He was 62 years old at that time.  Otherwise, there is no history of breast or ovarian cancer in the patient's family  GYNECOLOGIC HISTORY: She had menarche, age 76.  Menopause at age 13.  She is GX, P2.  First pregnancy to term, age 51.  She never took hormone replacement.  SOCIAL HISTORY: (Updated 08/19/2013) She works in  Airline pilot.  Her husband Rocky Morel") [place] has a history of prostate cancer, was in Holiday representative but is now retired.  They have 2 children, a son and a daughter, both living in this area and 2 grandchildren.  The patient is not a church attender.   ADVANCED DIRECTIVES:  HEALTH MAINTENANCE: (Updated 08/19/2013) History  Substance Use Topics  . Smoking status: Former Games developer  . Smokeless tobacco: Never Used  . Alcohol Use: Yes     Comment: occasionally  As noted, the patient had a 15 pack-year tobacco abuse history but has quit.  There is no history of alcohol abuse.     Colonoscopy:  05/17/2013, Dr. Russella Dar  PAP: June 2012, Dr. Oletha Blend  Bone density: 02/19/2013, [facility], osteoporosis  Lipid panel: UTD, Dr. Rogelia Rohrer  No Known Allergies  Current Outpatient Prescriptions  Medication Sig Dispense Refill  . anastrozole (ARIMIDEX) 1 MG tablet Take 1 tablet (1 mg total) by mouth daily.  30 tablet  5  . calcium-vitamin D (OSCAL WITH D) 500-200 MG-UNIT per tablet Take 1 tablet by mouth 2 (two) times daily.      Marland Kitchen escitalopram (LEXAPRO)  10 MG tablet TAKE 1 TABLET (10 MG TOTAL) BY MOUTH DAILY.  15 tablet  0  . gabapentin (NEURONTIN) 300 MG capsule TAKE ONE CAPSULE BY MOUTH AT BEDTIME  30 capsule  8  . ibuprofen (ADVIL,MOTRIN) 400 MG tablet Take 400 mg by mouth every 6 (six) hours as needed for pain.      Marland Kitchen LORazepam (ATIVAN) 0.5 MG tablet Take 1 tablet (0.5 mg total) by mouth 2 (two) times daily as needed for anxiety.  30 tablet  1  . potassium chloride (MICRO-K) 10 MEQ CR capsule Take 1 capsule (10 mEq total) by mouth 2 (two) times daily.  60 capsule  5   No current facility-administered medications for this visit.   Facility-Administered Medications Ordered in Other Visits  Medication Dose Route Frequency Provider Last Rate Last Dose  . Zoledronic Acid (ZOMETA) IVPB 4 mg  4 mg Intravenous Once Lowella Dell, MD   4 mg at 08/19/13 1558    OBJECTIVE: Middle-aged white woman who appears  comfortable and is in no acute distress Filed Vitals:   08/19/13 1446  BP: 147/76  Pulse: 75  Temp: 98.2 F (36.8 C)  Resp: 18     Body mass index is 25.4 kg/(m^2).    ECOG FS: 0 Filed Weights   08/19/13 1446  Weight: 157 lb 4.8 oz (71.351 kg)   Physical Exam: HEENT:  Sclerae anicteric.  Oropharynx clear. Fair dentition. NODES:  No cervical or supraclavicular lymphadenopathy palpated.  BREAST EXAM:  Patient is status post right mastectomy and reconstruction. No nodularity or evidence of local recurrence. Right breast is unremarkable. Axillae are benign bilaterally, with no palpable adenopathy LUNGS:  Clear to auscultation bilaterally.  No wheezes or rhonchi HEART:  Regular rate and rhythm. No murmur  ABDOMEN:  Soft, nontender.  Positive bowel sounds.  MSK:  No focal spinal tenderness to palpation. Good range of motion in the upper extremities. EXTREMITIES:  No peripheral edema.   NEURO:  Nonfocal. Well oriented.  Positive affect.    LAB RESULTS: Lab Results  Component Value Date   WBC 8.3 08/14/2013   NEUTROABS 5.8 08/14/2013   HGB 13.0 08/14/2013   HCT 39.3 08/14/2013   MCV 91.1 08/14/2013   PLT 294 08/14/2013      Chemistry      Component Value Date/Time   NA 142 08/14/2013 1430   NA 140 02/07/2012 0908   K 3.3* 08/14/2013 1430   K 4.0 02/07/2012 0908   CL 103 02/12/2013 1234   CL 107 02/07/2012 0908   CO2 30* 08/14/2013 1430   CO2 25 02/07/2012 0908   BUN 11.3 08/14/2013 1430   BUN 17 02/07/2012 0908   CREATININE 0.7 08/14/2013 1430   CREATININE 0.58 02/07/2012 0908      Component Value Date/Time   CALCIUM 9.4 08/14/2013 1430   CALCIUM 9.2 02/07/2012 0908   ALKPHOS 62 08/14/2013 1430   ALKPHOS 60 02/07/2012 0908   AST 15 08/14/2013 1430   AST 15 02/07/2012 0908   ALT 16 08/14/2013 1430   ALT 19 02/07/2012 0908   BILITOT 0.28 08/14/2013 1430   BILITOT 0.3 02/07/2012 0908      Vitamin D 25 hydroxy 38  08/14/2013     30  08/07/2012   STUDIES: Left mammogram at [facility] on  06/10/2013 was unremarkable. This will be repeated in one year.  Most recent bone density at [facility] on 02/19/2013 should continue osteoporosis, but with some improvement in bone density when compared to  prior studies.   ASSESSMENT: 62 y.o. [facility] woman status post:  (1) Right lumpectomy and axillary lymph node dissection  1998, for a T1c N0, estrogen receptor positive breast cancer, treated adjuvantly with doxorubicin and cyclophosphamide times 4, followed by radiation then tamoxifen for 5 years   (2) Right mastectomy with immediate implant and latissimus flap reconstruction, August 2012, for a T1c N1, grade 1, invasive ductal carcinoma, estrogen and progesterone receptor positive, HER2 negative, with an MIB1 of 13%.  (3) her Oncotype DX  recurrence score was 14 predicting for a 9% risk of distant recurrence within 10 years if she took 5 years of tamoxifen.    (4) on anastrozole as of November 2012  (5) with a history of osteoporosis, on denosumab initially, switched to zolendronic acid March 2013 because of possible further reduction in breast cancer recurrence, to continue every 6 months for 2 years, then yearly beginning September 2014, and continuing while on anastrozole.     PLAN:  Rosary appears to be doing well with regards to her breast cancer. There is no clinical evidence of disease recurrence at this time, and she seems to be tolerating the anastrozole well. The plan is to continue anastrozole for a minimum of 5 years (until November 2017).   I have refilled her anastrozole for another 6 months.  Deshae will receive her next every 6 month dose of zoledronic acid today as scheduled. From this point forward, however, she will receive zoledronic acid annually.   I am starting Sherae on a low dose of potassium supplementation for mild hypokalemia. She will take 10 mEq twice daily. We will repeat her labs in 3 months to make sure her potassium is normalizing. We'll repeat them again in 6  months when she returns to see Dr. Darnelle Catalan in March of 2015. At that time, she'll be due for labs and physical exam only, and her next dose of zoledronic acid will not be due until next September.   Lafonda Mosses voices understanding and agreement with this plan, and will call any changes or problems.    Marylon Verno    08/19/2013

## 2013-08-19 NOTE — Patient Instructions (Addendum)
Zoledronic Acid injection (Hypercalcemia, Oncology) What is this medicine? ZOLEDRONIC ACID (ZOE le dron ik AS id) lowers the amount of calcium loss from bone. It is used to treat too much calcium in your blood from cancer. It is also used to prevent complications of cancer that has spread to the bone. This medicine may be used for other purposes; ask your health care provider or pharmacist if you have questions. What should I tell my health care provider before I take this medicine? They need to know if you have any of these conditions: -aspirin-sensitive asthma -dental disease -kidney disease -an unusual or allergic reaction to zoledronic acid, other medicines, foods, dyes, or preservatives -pregnant or trying to get pregnant -breast-feeding How should I use this medicine? This medicine is for infusion into a vein. It is given by a health care professional in a hospital or clinic setting. Talk to your pediatrician regarding the use of this medicine in children. Special care may be needed. Overdosage: If you think you have taken too much of this medicine contact a poison control center or emergency room at once. NOTE: This medicine is only for you. Do not share this medicine with others. What if I miss a dose? It is important not to miss your dose. Call your doctor or health care professional if you are unable to keep an appointment. What may interact with this medicine? -certain antibiotics given by injection -NSAIDs, medicines for pain and inflammation, like ibuprofen or naproxen -some diuretics like bumetanide, furosemide -teriparatide -thalidomide This list may not describe all possible interactions. Give your health care provider a list of all the medicines, herbs, non-prescription drugs, or dietary supplements you use. Also tell them if you smoke, drink alcohol, or use illegal drugs. Some items may interact with your medicine. What should I watch for while using this medicine? Visit  your doctor or health care professional for regular checkups. It may be some time before you see the benefit from this medicine. Do not stop taking your medicine unless your doctor tells you to. Your doctor may order blood tests or other tests to see how you are doing. Women should inform their doctor if they wish to become pregnant or think they might be pregnant. There is a potential for serious side effects to an unborn child. Talk to your health care professional or pharmacist for more information. You should make sure that you get enough calcium and vitamin D while you are taking this medicine. Discuss the foods you eat and the vitamins you take with your health care professional. Some people who take this medicine have severe bone, joint, and/or muscle pain. This medicine may also increase your risk for a broken thigh bone. Tell your doctor right away if you have pain in your upper leg or groin. Tell your doctor if you have any pain that does not go away or that gets worse. What side effects may I notice from receiving this medicine? Side effects that you should report to your doctor or health care professional as soon as possible: -allergic reactions like skin rash, itching or hives, swelling of the face, lips, or tongue -anxiety, confusion, or depression -breathing problems -changes in vision -feeling faint or lightheaded, falls -jaw burning, cramping, pain -muscle cramps, stiffness, or weakness -trouble passing urine or change in the amount of urine Side effects that usually do not require medical attention (report to your doctor or health care professional if they continue or are bothersome): -bone, joint, or muscle pain -  fever -hair loss -irritation at site where injected -loss of appetite -nausea, vomiting -stomach upset -tired This list may not describe all possible side effects. Call your doctor for medical advice about side effects. You may report side effects to FDA at  1-800-FDA-1088. Where should I keep my medicine? This drug is given in a hospital or clinic and will not be stored at home. NOTE: This sheet is a summary. It may not cover all possible information. If you have questions about this medicine, talk to your doctor, pharmacist, or health care provider.  2012, Elsevier/Gold Standard. (05/13/2011 9:06:58 AM) 

## 2013-08-20 ENCOUNTER — Telehealth: Payer: Self-pay | Admitting: Oncology

## 2013-08-20 NOTE — Telephone Encounter (Signed)
S/w the pt and she is aware of her lab only appt in dec. Pt is aware of her lab and md appt in march of 2015.

## 2013-08-23 ENCOUNTER — Telehealth: Payer: Self-pay | Admitting: Family Medicine

## 2013-08-23 MED ORDER — ESCITALOPRAM OXALATE 10 MG PO TABS: 10.0000 mg | ORAL_TABLET | Freq: Every day | ORAL | Status: DC

## 2013-08-23 NOTE — Telephone Encounter (Signed)
Pt informed that I sent in 7 tablets

## 2013-08-23 NOTE — Telephone Encounter (Signed)
Patient states that she was told that she needed to be seen before any refills of lexapro could be given. She scheduled appointment for next Thursday and says that she only has one pill left and would like to know if we could refill enough to last her until her appointment?

## 2013-08-29 ENCOUNTER — Ambulatory Visit (INDEPENDENT_AMBULATORY_CARE_PROVIDER_SITE_OTHER): Payer: 59 | Admitting: Family Medicine

## 2013-08-29 ENCOUNTER — Encounter: Payer: Self-pay | Admitting: Family Medicine

## 2013-08-29 VITALS — BP 108/80 | HR 71 | Temp 98.1°F | Ht 66.0 in | Wt 156.1 lb

## 2013-08-29 DIAGNOSIS — M810 Age-related osteoporosis without current pathological fracture: Secondary | ICD-10-CM

## 2013-08-29 DIAGNOSIS — I959 Hypotension, unspecified: Secondary | ICD-10-CM

## 2013-08-29 DIAGNOSIS — E876 Hypokalemia: Secondary | ICD-10-CM

## 2013-08-29 DIAGNOSIS — F329 Major depressive disorder, single episode, unspecified: Secondary | ICD-10-CM

## 2013-08-29 DIAGNOSIS — Z23 Encounter for immunization: Secondary | ICD-10-CM

## 2013-08-29 DIAGNOSIS — R030 Elevated blood-pressure reading, without diagnosis of hypertension: Secondary | ICD-10-CM

## 2013-08-29 DIAGNOSIS — Z87891 Personal history of nicotine dependence: Secondary | ICD-10-CM

## 2013-08-29 DIAGNOSIS — L989 Disorder of the skin and subcutaneous tissue, unspecified: Secondary | ICD-10-CM

## 2013-08-29 DIAGNOSIS — E785 Hyperlipidemia, unspecified: Secondary | ICD-10-CM

## 2013-08-29 DIAGNOSIS — F341 Dysthymic disorder: Secondary | ICD-10-CM

## 2013-08-29 LAB — CBC
HCT: 39.1 % (ref 36.0–46.0)
Hemoglobin: 13.3 g/dL (ref 12.0–15.0)
MCHC: 34 g/dL (ref 30.0–36.0)
Platelets: 365 10*3/uL (ref 150–400)
RBC: 4.43 MIL/uL (ref 3.87–5.11)
WBC: 8.8 10*3/uL (ref 4.0–10.5)

## 2013-08-29 MED ORDER — ESCITALOPRAM OXALATE 10 MG PO TABS: 10.0000 mg | ORAL_TABLET | Freq: Every day | ORAL | Status: DC

## 2013-08-29 NOTE — Patient Instructions (Addendum)

## 2013-08-30 ENCOUNTER — Telehealth: Payer: Self-pay

## 2013-08-30 ENCOUNTER — Encounter: Payer: Self-pay | Admitting: Family Medicine

## 2013-08-30 DIAGNOSIS — E875 Hyperkalemia: Secondary | ICD-10-CM

## 2013-08-30 LAB — HEPATIC FUNCTION PANEL
ALT: 14 U/L (ref 0–35)
AST: 15 U/L (ref 0–37)
Alkaline Phosphatase: 62 U/L (ref 39–117)
Bilirubin, Direct: 0.1 mg/dL (ref 0.0–0.3)
Indirect Bilirubin: 0.2 mg/dL (ref 0.0–0.9)
Total Protein: 7.5 g/dL (ref 6.0–8.3)

## 2013-08-30 LAB — LIPID PANEL
Cholesterol: 215 mg/dL — ABNORMAL HIGH (ref 0–200)
LDL Cholesterol: 144 mg/dL — ABNORMAL HIGH (ref 0–99)
Total CHOL/HDL Ratio: 4.8 Ratio
Triglycerides: 129 mg/dL (ref <150)
VLDL: 26 mg/dL (ref 0–40)

## 2013-08-30 LAB — RENAL FUNCTION PANEL
BUN: 14 mg/dL (ref 6–23)
CO2: 29 mEq/L (ref 19–32)
Calcium: 9.7 mg/dL (ref 8.4–10.5)
Chloride: 101 mEq/L (ref 96–112)
Glucose, Bld: 93 mg/dL (ref 70–99)
Phosphorus: 3.8 mg/dL (ref 2.3–4.6)
Potassium: 4.5 mEq/L (ref 3.5–5.3)
Sodium: 136 mEq/L (ref 135–145)

## 2013-08-30 NOTE — Assessment & Plan Note (Signed)
Tolerating Zometa had a recent treatment.

## 2013-08-30 NOTE — Progress Notes (Signed)
Quick Note:  Patient Informed and voiced understanding ______ 

## 2013-08-30 NOTE — Assessment & Plan Note (Signed)
Encouraged ongoing avoidance.

## 2013-08-30 NOTE — Assessment & Plan Note (Signed)
Well controlled, no changes today 

## 2013-08-30 NOTE — Progress Notes (Signed)
Patient ID: [name], female   DOB: February 26, 1951, 62 y.o.   MRN: [id number] [name] [id number] 1951-05-10 08/30/2013      Progress Note-Follow Up  Subjective  Chief Complaint  Chief Complaint  Patient presents with  . Medication Refill    HPI  Patient is a 62 year old Caucasian female who is in today for followup. Generally feeling well. No recent illness. Continues to tolerate Arimidex and Zometa following closely with oncology. Patient denies chest pain, palpitations, shortness of breath, GI or GU complaints. Taking medications as prescribed.  Past Medical History  Diagnosis Date  . Cancer 4132,4401  . Chicken pox   . Left hip pain 05/04/2011  . Colon polyps   . Dyslipidemia   . Tobacco abuse 05/05/2011  . Osteoporosis 02/07/2012  . Depression with anxiety 09/07/2012  . Elevated BP 09/09/2012  . Preventative health care 09/09/2012  . H/O tobacco use, presenting hazards to health 05/05/2011    Past Surgical History  Procedure Laterality Date  . Appendectomy  2010  . Breast lumpectomy  1998    right  . Breast surgery  07/08/11    right mastectomy+axlnd, reconstruction,T1cN1a,ERPR+,HER2-  . Breast surgery  march,2013    breast implant deflated    Family History  Problem Relation Age of Onset  . Diabetes Mother     type 2  . Obesity Mother   . Other Mother   . Cancer Father     ?  Marland Kitchen Other Maternal Grandmother     heart palpitations  . Other Paternal Grandmother     heart arrythmia/ palpitations  . Colon cancer Neg Hx   . Esophageal cancer Neg Hx   . Rectal cancer Neg Hx   . Stomach cancer Neg Hx     History   Social History  . Marital Status: Married    Spouse Name: N/A    Number of Children: N/A  . Years of Education: N/A   Occupational History  . Not on file.   Social History Main Topics  . Smoking status: Former Games developer  . Smokeless tobacco: Never Used  . Alcohol Use: Yes     Comment: occasionally  . Drug Use: No  . Sexual Activity:  No   Other Topics Concern  . Not on file   Social History Narrative  . No narrative on file    Current Outpatient Prescriptions on File Prior to Visit  Medication Sig Dispense Refill  . anastrozole (ARIMIDEX) 1 MG tablet Take 1 tablet (1 mg total) by mouth daily.  30 tablet  5  . calcium-vitamin D (OSCAL WITH D) 500-200 MG-UNIT per tablet Take 1 tablet by mouth 2 (two) times daily.      Marland Kitchen gabapentin (NEURONTIN) 300 MG capsule TAKE ONE CAPSULE BY MOUTH AT BEDTIME  30 capsule  8  . ibuprofen (ADVIL,MOTRIN) 400 MG tablet Take 400 mg by mouth every 6 (six) hours as needed for pain.      Marland Kitchen LORazepam (ATIVAN) 0.5 MG tablet Take 1 tablet (0.5 mg total) by mouth 2 (two) times daily as needed for anxiety.  30 tablet  1  . potassium chloride (MICRO-K) 10 MEQ CR capsule Take 1 capsule (10 mEq total) by mouth 2 (two) times daily.  60 capsule  5   No current facility-administered medications on file prior to visit.    No Known Allergies  Review of Systems  Review of Systems  Constitutional: Negative for fever and malaise/fatigue.  HENT: Negative for congestion.  Eyes: Negative for discharge.  Respiratory: Negative for shortness of breath.   Cardiovascular: Negative for chest pain, palpitations and leg swelling.  Gastrointestinal: Negative for nausea, abdominal pain and diarrhea.  Genitourinary: Negative for dysuria.  Musculoskeletal: Negative for falls.  Skin: Negative for rash.  Neurological: Negative for loss of consciousness and headaches.  Endo/Heme/Allergies: Negative for polydipsia.  Psychiatric/Behavioral: Negative for depression and suicidal ideas. The patient is not nervous/anxious and does not have insomnia.     Objective  BP 108/80  Pulse 71  Temp(Src) 98.1 F (36.7 C) (Oral)  Ht 5\' 6"  (1.676 m)  Wt 156 lb 1.9 oz (70.816 kg)  BMI 25.21 kg/m2  SpO2 96%  Physical Exam  Physical Exam  Constitutional: She is oriented to person, place, and time and well-developed,  well-nourished, and in no distress. No distress.  HENT:  Head: Normocephalic and atraumatic.  Eyes: Conjunctivae are normal.  Neck: Neck supple. No thyromegaly present.  Cardiovascular: Normal rate, regular rhythm and normal heart sounds.   No murmur heard. Pulmonary/Chest: Effort normal and breath sounds normal. She has no wheezes.  Abdominal: She exhibits no distension and no mass.  Musculoskeletal: She exhibits no edema.  Lymphadenopathy:    She has no cervical adenopathy.  Neurological: She is alert and oriented to person, place, and time.  Skin: Skin is warm and dry. No rash noted. She is not diaphoretic.  Psychiatric: Memory, affect and judgment normal.    Lab Results  Component Value Date   TSH 1.638 08/29/2013   Lab Results  Component Value Date   WBC 8.8 08/29/2013   HGB 13.3 08/29/2013   HCT 39.1 08/29/2013   MCV 88.3 08/29/2013   PLT 365 08/29/2013   Lab Results  Component Value Date   CREATININE 0.63 08/29/2013   BUN 14 08/29/2013   NA 136 08/29/2013   K 4.5 08/29/2013   CL 101 08/29/2013   CO2 29 08/29/2013   Lab Results  Component Value Date   ALT 14 08/29/2013   AST 15 08/29/2013   ALKPHOS 62 08/29/2013   BILITOT 0.3 08/29/2013   Lab Results  Component Value Date   CHOL 215* 08/29/2013   Lab Results  Component Value Date   HDL 45 08/29/2013   Lab Results  Component Value Date   LDLCALC 144* 08/29/2013   Lab Results  Component Value Date   TRIG 129 08/29/2013   Lab Results  Component Value Date   CHOLHDL 4.8 08/29/2013     Assessment & Plan  H/O tobacco use, presenting hazards to health Encouraged ongoing avoidance.  Elevated BP Well controlled, no changes today  Hypokalemia Resolved, will drop KCL to 10 meq daily  Osteoporosis Tolerating Zometa had a recent treatment.  Dyslipidemia Mild aovid trans fats, minimize simple carbs, saturated fats and increase exercise, consider krill oil caps

## 2013-08-30 NOTE — Assessment & Plan Note (Signed)
Mild aovid trans fats, minimize simple carbs, saturated fats and increase exercise, consider krill oil caps

## 2013-08-30 NOTE — Assessment & Plan Note (Signed)
Resolved, will drop KCL to 10 meq daily

## 2013-08-30 NOTE — Telephone Encounter (Signed)
Detailed message on home phone. Renal order added

## 2013-08-30 NOTE — Telephone Encounter (Signed)
Message copied by Court Joy on Fri Aug 30, 2013 10:27 AM ------      Message from: Danise Edge A      Created: Fri Aug 30, 2013 10:23 AM       She was started on KCL by cardiology bid and now her potassium is normal but high end. Please have her drop the KCL to once daily and change MAR, recommend we repeat renal panel in about a month to make sure that is the right level ------

## 2013-10-04 ENCOUNTER — Other Ambulatory Visit: Payer: Self-pay | Admitting: Physician Assistant

## 2013-10-04 DIAGNOSIS — C50919 Malignant neoplasm of unspecified site of unspecified female breast: Secondary | ICD-10-CM

## 2013-10-15 ENCOUNTER — Other Ambulatory Visit: Payer: Self-pay | Admitting: Family Medicine

## 2013-10-15 NOTE — Telephone Encounter (Signed)
Pt has not had since 10-13. Pt needs to call and inform us why she needs this medication again

## 2013-11-01 ENCOUNTER — Telehealth: Payer: Self-pay | Admitting: Oncology

## 2013-11-01 ENCOUNTER — Ambulatory Visit (INDEPENDENT_AMBULATORY_CARE_PROVIDER_SITE_OTHER): Payer: 59

## 2013-11-01 ENCOUNTER — Ambulatory Visit: Payer: 59

## 2013-11-01 DIAGNOSIS — Z23 Encounter for immunization: Secondary | ICD-10-CM

## 2013-11-01 DIAGNOSIS — Z2911 Encounter for prophylactic immunotherapy for respiratory syncytial virus (RSV): Secondary | ICD-10-CM

## 2013-11-01 LAB — RENAL FUNCTION PANEL
Albumin: 3.9 g/dL (ref 3.5–5.2)
BUN: 13 mg/dL (ref 6–23)
Calcium: 8.9 mg/dL (ref 8.4–10.5)
Creat: 0.55 mg/dL (ref 0.50–1.10)
Phosphorus: 3.3 mg/dL (ref 2.3–4.6)
Potassium: 4.2 mEq/L (ref 3.5–5.3)

## 2013-11-01 NOTE — Progress Notes (Signed)
   Subjective:    Patient ID: [name], female    DOB: 1951/07/29, 62 y.o.   MRN: [id number]  HPI    Review of Systems     Objective:   Physical Exam        Assessment & Plan:  Pt came in for a Zostavax injection. Pt tolerated injection well and stated she had spoke with her insurance company and they will pay for the injection

## 2013-11-01 NOTE — Telephone Encounter (Signed)
Called pt , returned call ,cancelled lab per pt rqst PCP to draw labs

## 2013-11-04 ENCOUNTER — Other Ambulatory Visit: Payer: 59 | Admitting: Lab

## 2013-11-06 ENCOUNTER — Other Ambulatory Visit: Payer: Self-pay

## 2013-11-06 DIAGNOSIS — F419 Anxiety disorder, unspecified: Secondary | ICD-10-CM

## 2013-11-06 DIAGNOSIS — F329 Major depressive disorder, single episode, unspecified: Secondary | ICD-10-CM

## 2013-11-06 MED ORDER — LORAZEPAM 0.5 MG PO TABS: 0.5000 mg | ORAL_TABLET | Freq: Two times a day (BID) | ORAL | Status: DC | PRN

## 2013-11-06 NOTE — Telephone Encounter (Signed)
RX faxed

## 2013-11-06 NOTE — Telephone Encounter (Signed)
Refill granted.  Quantity 30 w/ 1 refill.

## 2013-11-06 NOTE — Telephone Encounter (Signed)
John with [facility] called asking for pts lorazepam to be refilled. Last RX was done on 09-07-12 quantity 30 with 1 refill  If ok fax to [phone]

## 2013-11-07 ENCOUNTER — Telehealth: Payer: Self-pay | Admitting: Family Medicine

## 2013-11-07 NOTE — Telephone Encounter (Signed)
Got her lab results  Should she continue taking the potassium

## 2013-11-07 NOTE — Telephone Encounter (Signed)
Lorazepam .05 qty 30   Patient would like to get 60 if possible as she has met her deductable and all her prescriptions are free till the end of the year

## 2013-11-08 NOTE — Telephone Encounter (Signed)
Since she just picked it up she cannot have it now but I am willing to write one rx at the end of the month for same sig, #60 no rf and that will be a 2 month supply

## 2013-11-08 NOTE — Telephone Encounter (Signed)
Left a detailed message on patients vm 

## 2013-11-08 NOTE — Telephone Encounter (Signed)
Please advise refill? (look at Marjs note) Last RX done on 11-06-13 quantity 30 with 1 refill.  I called [facility] and spoke to [place] and she stated pt picked up 30 on 11-07-13 (yesterday)

## 2013-11-19 ENCOUNTER — Telehealth: Payer: Self-pay | Admitting: Family Medicine

## 2013-11-19 MED ORDER — ACYCLOVIR 400 MG PO TABS: 400.0000 mg | ORAL_TABLET | Freq: Three times a day (TID) | ORAL | Status: DC

## 2013-11-19 NOTE — Telephone Encounter (Signed)
Patient would like an rx for acoclyvir pills called in to [facility].  She has had a flair up of mouth sores

## 2013-11-19 NOTE — Telephone Encounter (Signed)
Please advise refill? 

## 2013-11-19 NOTE — Telephone Encounter (Signed)
OK to give Acyclovir 400 mg po tid x 5 days, 15 with 1 rf

## 2013-12-31 ENCOUNTER — Telehealth: Payer: Self-pay | Admitting: *Deleted

## 2013-12-31 ENCOUNTER — Ambulatory Visit: Payer: 59 | Admitting: Family Medicine

## 2013-12-31 DIAGNOSIS — Z0289 Encounter for other administrative examinations: Secondary | ICD-10-CM

## 2013-12-31 NOTE — Telephone Encounter (Signed)
sw pt informed her that [facility] will be on call 02/24/14. Pt request to come in on 02/26/14 @ 3:30pm...td

## 2014-02-17 ENCOUNTER — Other Ambulatory Visit (HOSPITAL_BASED_OUTPATIENT_CLINIC_OR_DEPARTMENT_OTHER): Payer: 59

## 2014-02-17 DIAGNOSIS — C773 Secondary and unspecified malignant neoplasm of axilla and upper limb lymph nodes: Secondary | ICD-10-CM

## 2014-02-17 DIAGNOSIS — C50911 Malignant neoplasm of unspecified site of right female breast: Secondary | ICD-10-CM

## 2014-02-17 DIAGNOSIS — E876 Hypokalemia: Secondary | ICD-10-CM

## 2014-02-17 DIAGNOSIS — M810 Age-related osteoporosis without current pathological fracture: Secondary | ICD-10-CM

## 2014-02-17 DIAGNOSIS — C50519 Malignant neoplasm of lower-outer quadrant of unspecified female breast: Secondary | ICD-10-CM

## 2014-02-17 LAB — CBC WITH DIFFERENTIAL/PLATELET
BASO%: 0.9 % (ref 0.0–2.0)
Basophils Absolute: 0.1 10*3/uL (ref 0.0–0.1)
EOS ABS: 0.1 10*3/uL (ref 0.0–0.5)
EOS%: 1.8 % (ref 0.0–7.0)
HCT: 37.9 % (ref 34.8–46.6)
HGB: 12.4 g/dL (ref 11.6–15.9)
LYMPH%: 32.1 % (ref 14.0–49.7)
MCH: 29.6 pg (ref 25.1–34.0)
MCHC: 32.7 g/dL (ref 31.5–36.0)
MCV: 90.6 fL (ref 79.5–101.0)
MONO#: 0.5 10*3/uL (ref 0.1–0.9)
MONO%: 6.6 % (ref 0.0–14.0)
NEUT%: 58.6 % (ref 38.4–76.8)
NEUTROS ABS: 4 10*3/uL (ref 1.5–6.5)
Platelets: 282 10*3/uL (ref 145–400)
RBC: 4.19 10*6/uL (ref 3.70–5.45)
RDW: 13.7 % (ref 11.2–14.5)
WBC: 6.9 10*3/uL (ref 3.9–10.3)
lymph#: 2.2 10*3/uL (ref 0.9–3.3)

## 2014-02-17 LAB — COMPREHENSIVE METABOLIC PANEL (CC13)
ALBUMIN: 4 g/dL (ref 3.5–5.0)
ALT: 15 U/L (ref 0–55)
AST: 15 U/L (ref 5–34)
Alkaline Phosphatase: 54 U/L (ref 40–150)
Anion Gap: 7 mEq/L (ref 3–11)
BILIRUBIN TOTAL: 0.31 mg/dL (ref 0.20–1.20)
BUN: 17.9 mg/dL (ref 7.0–26.0)
CO2: 26 mEq/L (ref 22–29)
Calcium: 9.2 mg/dL (ref 8.4–10.4)
Chloride: 105 mEq/L (ref 98–109)
Creatinine: 0.7 mg/dL (ref 0.6–1.1)
Glucose: 105 mg/dl (ref 70–140)
POTASSIUM: 4 meq/L (ref 3.5–5.1)
SODIUM: 138 meq/L (ref 136–145)
TOTAL PROTEIN: 7.1 g/dL (ref 6.4–8.3)

## 2014-02-18 LAB — VITAMIN D 25 HYDROXY (VIT D DEFICIENCY, FRACTURES): Vit D, 25-Hydroxy: 35 ng/mL (ref 30–89)

## 2014-02-24 ENCOUNTER — Ambulatory Visit: Payer: 59 | Admitting: Oncology

## 2014-02-26 ENCOUNTER — Ambulatory Visit (HOSPITAL_BASED_OUTPATIENT_CLINIC_OR_DEPARTMENT_OTHER): Payer: 59 | Admitting: Oncology

## 2014-02-26 ENCOUNTER — Telehealth: Payer: Self-pay | Admitting: Physician Assistant

## 2014-02-26 VITALS — BP 115/66 | HR 69 | Temp 98.8°F | Resp 18 | Ht 66.0 in | Wt 159.0 lb

## 2014-02-26 DIAGNOSIS — F418 Other specified anxiety disorders: Secondary | ICD-10-CM

## 2014-02-26 DIAGNOSIS — F329 Major depressive disorder, single episode, unspecified: Secondary | ICD-10-CM

## 2014-02-26 DIAGNOSIS — R0789 Other chest pain: Secondary | ICD-10-CM

## 2014-02-26 DIAGNOSIS — E876 Hypokalemia: Secondary | ICD-10-CM

## 2014-02-26 DIAGNOSIS — Z87891 Personal history of nicotine dependence: Secondary | ICD-10-CM

## 2014-02-26 DIAGNOSIS — Z170 Estrogen receptor positive status [ER+]: Secondary | ICD-10-CM

## 2014-02-26 DIAGNOSIS — C50911 Malignant neoplasm of unspecified site of right female breast: Secondary | ICD-10-CM

## 2014-02-26 DIAGNOSIS — G8929 Other chronic pain: Secondary | ICD-10-CM

## 2014-02-26 DIAGNOSIS — F419 Anxiety disorder, unspecified: Secondary | ICD-10-CM

## 2014-02-26 DIAGNOSIS — M810 Age-related osteoporosis without current pathological fracture: Secondary | ICD-10-CM

## 2014-02-26 DIAGNOSIS — C50519 Malignant neoplasm of lower-outer quadrant of unspecified female breast: Secondary | ICD-10-CM

## 2014-02-26 MED ORDER — ANASTROZOLE 1 MG PO TABS: 1.0000 mg | ORAL_TABLET | Freq: Every day | ORAL | Status: DC

## 2014-02-26 MED ORDER — ACYCLOVIR 400 MG PO TABS: 400.0000 mg | ORAL_TABLET | Freq: Three times a day (TID) | ORAL | Status: DC

## 2014-02-26 MED ORDER — LORAZEPAM 0.5 MG PO TABS: 0.5000 mg | ORAL_TABLET | Freq: Three times a day (TID) | ORAL | Status: DC

## 2014-02-26 NOTE — Progress Notes (Signed)
ID: [name]   DOB: September 25, 1951  MR#: [id number]  CSN#:[id number]  PCP: Penni Homans, MD GYN:  SU:  OTHER MD: Marolyn Hammock, DDS   HISTORY OF PRESENT ILLNESS: The patient formally saw Dr. Truddie Coco for a remote history of right breast cancer status post lumpectomy and axillary lymph node dissection in August 1998.  This was a T1c N0, estrogen and progesterone receptor positive tumor, which was treated adjuvantly with doxorubicin and cyclophosphamide x4 followed by adjuvant radiation completed in March 1998 followed by 5 years of tamoxifen completed in January 2004.  More recently, screening mammography at [facility], June 2012, found a possible abnormality in the right breast.  The patient was brought back for additional views the next day and additional spot compression views were obtained, with the report not available in our system.  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   With this information, the patient was referred for bilateral breast MRIs, May 15, 2011.  In addition to the prior lumpectomy changes, there was an enhancing irregular mass in the lower-outer quadrant of the right breast measuring 1.6 cm.  The rest of the MRI exam was unremarkable except for a mildly prominent right axillary lymph node.  With this information, the patient was presented at the [facility] and the recommendation was made for mastectomy with an Oncotype to be sent at the same time.   The patient did undergo right mastectomy, Dr. Marlou Starks, with sentinel lymph node biopsy, and  immediate reconstruction with a saline implant and right latissimus myocutaneous flap under Crissie Reese, August 10th.  The pathology from this procedure ([id number]) confirmed a well-differentiated invasive ductal carcinoma measuring 1.5 cm with ample margins and 1/6 lymph nodes involved.  HER2 was repeated and was again negative.   With this information, the patient was seen by Dr. Truddie Coco and started on anastrozole as well as Delton See given her history of osteoporosis.  She has also been seen by Genetics and had an Oncotype DX as noted below. She established herself in my service October 2012.  INTERVAL HISTORY: [name] alone today for followup of her right breast cancer. The interval history is generally unremarkable. She is tolerating the anastrozole with mild hot flashes and no significant change in baseline vaginal dryness. She did see her dentist and they were originally planning a tooth extraction and implant, but because she is on zolendronate, even though infrequently, he decided to avoid any jaw surgery and I think that was a good position. She had a different kind of reconstruction and HER-2 is, which is cosmetically versus successful.   REVIEW OF SYSTEMS: [name] denies any other problems than those mentioned above. A detailed review of systems today was otherwise entirely negative  PAST MEDICAL HISTORY: Past Medical History  Diagnosis Date  . Cancer 8315,1761  . Chicken pox   . Left hip pain 05/04/2011  . Colon polyps   . Dyslipidemia   . Tobacco abuse 05/05/2011  . Osteoporosis 02/07/2012  . Depression with anxiety 09/07/2012  . Elevated BP 09/09/2012  . Preventative health care 09/09/2012  . H/O tobacco use, presenting  hazards to health 05/05/2011  Significant for appendectomy remotely, history of osteopenia/osteoporosis, history of dyslipidemia, and history of 15 pack-year tobacco abuse.   PAST SURGICAL HISTORY: Past Surgical History  Procedure Laterality Date  . Appendectomy   2010  . Breast lumpectomy  1998    right  . Breast surgery  07/08/11    right mastectomy+axlnd, reconstruction,T1cN1a,ERPR+,HER2-  . Breast surgery  march,2013    breast implant deflated    FAMILY HISTORY Family History  Problem Relation Age of Onset  . Diabetes Mother     type 2  . Obesity Mother   . Other Mother   . Cancer Father     ?  Marland Kitchen Other Maternal Grandmother     heart palpitations  . Other Paternal Grandmother     heart arrythmia/ palpitations  . Colon cancer Neg Hx   . Esophageal cancer Neg Hx   . Rectal cancer Neg Hx   . Stomach cancer Neg Hx   This is essentially noncontributory.  The patient's father did die from cancer but they do not know the primary.  It seems to have been most likely a lung cancer that was widely metastatic at diagnosis.  He was 63 years old at that time.  Otherwise, there is no history of breast or ovarian cancer in the patient's family  GYNECOLOGIC HISTORY: She had menarche, age 28.  Menopause at age 9.  She is GX, P2.  First pregnancy to term, age 76.  She never took hormone replacement.  SOCIAL HISTORY: (Updated 08/19/2013) She works in Press photographer.  Her husband [name] has a history of prostate cancer, was in Architect but is now retired.  They have 2 children, a son and a daughter, both living in this area and 2 grandchildren.  The patient is not a church attender.   ADVANCED DIRECTIVES:  HEALTH MAINTENANCE: (Updated 08/19/2013) History  Substance Use Topics  . Smoking status: Former Research scientist (life sciences)  . Smokeless tobacco: Never Used  . Alcohol Use: Yes     Comment: occasionally  As noted, the patient had a 15 pack-year tobacco abuse history but has quit.  There is no history of alcohol abuse.     Colonoscopy:  05/17/2013, Dr. Fuller Plan  PAP: June 2012, Dr. Leonel Ramsay  Bone density: 02/19/2013, [facility], osteoporosis with a T score of -2.5  Lipid panel: UTD, Dr. Randel Pigg  No Known Allergies  Current Outpatient Prescriptions   Medication Sig Dispense Refill  . acyclovir (ZOVIRAX) 400 MG tablet Take 1 tablet (400 mg total) by mouth 3 (three) times daily. X 5 days  15 tablet  1  . anastrozole (ARIMIDEX) 1 MG tablet Take 1 tablet (1 mg total) by mouth daily.  30 tablet  5  . calcium-vitamin D (OSCAL WITH D) 500-200 MG-UNIT per tablet Take 1 tablet by mouth 2 (two) times daily.      Marland Kitchen escitalopram (LEXAPRO) 10 MG tablet Take 1 tablet (10 mg total) by mouth daily.  30 tablet  5  . gabapentin (NEURONTIN) 300 MG capsule TAKE ONE CAPSULE BY MOUTH AT BEDTIME  30 capsule  8  . ibuprofen (ADVIL,MOTRIN) 400 MG tablet Take 400 mg by mouth every 6 (six) hours as needed for pain.      Marland Kitchen LORazepam (ATIVAN) 0.5 MG tablet Take 1 tablet (0.5 mg total) by mouth 2 (two) times daily as needed for anxiety.  30 tablet  1  . LORazepam (ATIVAN) 0.5 MG tablet Take 1 tablet (0.5 mg total) by mouth every  8 (eight) hours.  30 tablet  0  . potassium chloride (MICRO-K) 10 MEQ CR capsule Take 1 capsule (10 mEq total) by mouth 2 (two) times daily.  60 capsule  5   No current facility-administered medications for this visit.    OBJECTIVE: Middle-aged white woman in no acute distress Filed Vitals:   02/26/14 1603  BP: 115/66  Pulse: 69  Temp: 98.8 F (37.1 C)  Resp: 18     Body mass index is 25.68 kg/(m^2).    ECOG FS: 0 Filed Weights   02/26/14 1603  Weight: 159 lb (72.122 kg)   Sclerae unicteric, pupils equal and round Oropharynx clear and moist-- teeth in good repair; the recent work on the front teeth is not easily noticeable No cervical or supraclavicular adenopathy Lungs no rales or rhonchi Heart regular rate and rhythm Abd soft, nontender, positive bowel sounds MSK no focal spinal tenderness, no upper extremity lymphedema Neuro: nonfocal, well oriented, appropriate affect Breasts: The right breast is status post mastectomy. There is no evidence of local recurrence. The right axilla is benign. Left breast is unremarkable   LAB  RESULTS: Lab Results  Component Value Date   WBC 6.9 02/17/2014   NEUTROABS 4.0 02/17/2014   HGB 12.4 02/17/2014   HCT 37.9 02/17/2014   MCV 90.6 02/17/2014   PLT 282 02/17/2014      Chemistry      Component Value Date/Time   NA 138 02/17/2014 1602   NA 137 11/01/2013 1537   K 4.0 02/17/2014 1602   K 4.2 11/01/2013 1537   CL 100 11/01/2013 1537   CL 103 02/12/2013 1234   CO2 26 02/17/2014 1602   CO2 29 11/01/2013 1537   BUN 17.9 02/17/2014 1602   BUN 13 11/01/2013 1537   CREATININE 0.7 02/17/2014 1602   CREATININE 0.55 11/01/2013 1537   CREATININE 0.58 02/07/2012 0908      Component Value Date/Time   CALCIUM 9.2 02/17/2014 1602   CALCIUM 8.9 11/01/2013 1537   ALKPHOS 54 02/17/2014 1602   ALKPHOS 62 08/29/2013 1351   AST 15 02/17/2014 1602   AST 15 08/29/2013 1351   ALT 15 02/17/2014 1602   ALT 14 08/29/2013 1351   BILITOT 0.31 02/17/2014 1602   BILITOT 0.3 08/29/2013 1351    Results for TISHARA, PIZANO (MRN [id number]) as of 02/26/2014 19:29  Ref. Range 08/07/2012 10:19 08/14/2013 14:30 02/17/2014 16:02  Vit D, 25-Hydroxy Latest Range: 30-89 ng/mL 30 38 35    STUDIES: Left mammogram at [facility] on 06/10/2013 was unremarkable. This will be repeated in one year.  Most recent bone density at [facility] on 02/19/2013 showed osteoporosis, but with some improvement in bone density when compared to prior studies.   ASSESSMENT: 63 y.o. [facility] woman status post:  (1) Right lumpectomy and axillary lymph node dissection  1998, for a T1c N0, estrogen receptor positive breast cancer, treated adjuvantly with doxorubicin and cyclophosphamide times 4, followed by radiation then tamoxifen for 5 years   (2) Right mastectomy with immediate implant and latissimus flap reconstruction, August 2012, for a T1c N1, grade 1, invasive ductal carcinoma, estrogen and progesterone receptor positive, HER2 negative, with an MIB1 of 13%.  (3) her Oncotype DX  recurrence score was 14 predicting for a 9% risk of distant recurrence  within 10 years if she took 5 years of tamoxifen.    (4) on anastrozole as of November 2012  (5) with a history of osteoporosis, on denosumab initially, switched to zolendronic  acid March 2013 because of possible further reduction in breast cancer recurrence, to continue every 6 months for 2 years, then yearly beginning September 2014, and continuing while on anastrozole.     PLAN:  Sherl is doing fine as far as her breast cancer is concerned, and the plan is to continue anastrozole for total of 5 years, which is clinically chest in November of 2017.  We're going to continue the yearly zolendronate until she completes the anastrozole. She will see [place] again in September, after her summertime mammogram, and she will receive zolendronate that same visit. After that we will see her on a yearly basis until she completes her followup here. Maruice Pieroni C    02/26/2014

## 2014-02-26 NOTE — Telephone Encounter (Signed)
, °

## 2014-02-27 ENCOUNTER — Telehealth: Payer: Self-pay | Admitting: *Deleted

## 2014-02-27 NOTE — Telephone Encounter (Signed)
Per staff message and POF I have scheduled appts.  JMW  

## 2014-03-18 ENCOUNTER — Other Ambulatory Visit: Payer: Self-pay | Admitting: Family Medicine

## 2014-03-18 NOTE — Telephone Encounter (Signed)
Please call and schedule a follow up appt w/pt. Dr Charlett Blake wanted her to return around 12-30-13

## 2014-03-19 NOTE — Telephone Encounter (Signed)
Informed patient of medication refill and she scheduled appointment for 04/07/14

## 2014-04-08 ENCOUNTER — Encounter: Payer: Self-pay | Admitting: Family Medicine

## 2014-04-08 ENCOUNTER — Ambulatory Visit (INDEPENDENT_AMBULATORY_CARE_PROVIDER_SITE_OTHER): Payer: 59 | Admitting: Family Medicine

## 2014-04-08 VITALS — BP 118/68 | HR 62 | Temp 99.1°F | Ht 66.0 in | Wt 158.1 lb

## 2014-04-08 DIAGNOSIS — IMO0001 Reserved for inherently not codable concepts without codable children: Secondary | ICD-10-CM

## 2014-04-08 DIAGNOSIS — F418 Other specified anxiety disorders: Secondary | ICD-10-CM

## 2014-04-08 DIAGNOSIS — R030 Elevated blood-pressure reading, without diagnosis of hypertension: Secondary | ICD-10-CM

## 2014-04-08 DIAGNOSIS — C50919 Malignant neoplasm of unspecified site of unspecified female breast: Secondary | ICD-10-CM

## 2014-04-08 DIAGNOSIS — F329 Major depressive disorder, single episode, unspecified: Secondary | ICD-10-CM

## 2014-04-08 DIAGNOSIS — F341 Dysthymic disorder: Secondary | ICD-10-CM

## 2014-04-08 DIAGNOSIS — B009 Herpesviral infection, unspecified: Secondary | ICD-10-CM

## 2014-04-08 DIAGNOSIS — B001 Herpesviral vesicular dermatitis: Secondary | ICD-10-CM

## 2014-04-08 DIAGNOSIS — F419 Anxiety disorder, unspecified: Principal | ICD-10-CM

## 2014-04-08 DIAGNOSIS — C50911 Malignant neoplasm of unspecified site of right female breast: Secondary | ICD-10-CM

## 2014-04-08 DIAGNOSIS — E785 Hyperlipidemia, unspecified: Secondary | ICD-10-CM

## 2014-04-08 MED ORDER — LORAZEPAM 0.5 MG PO TABS: 0.5000 mg | ORAL_TABLET | Freq: Two times a day (BID) | ORAL | Status: DC | PRN

## 2014-04-08 MED ORDER — ACYCLOVIR 400 MG PO TABS: 400.0000 mg | ORAL_TABLET | Freq: Three times a day (TID) | ORAL | Status: DC

## 2014-04-08 MED ORDER — ESCITALOPRAM OXALATE 10 MG PO TABS: 10.0000 mg | ORAL_TABLET | Freq: Every day | ORAL | Status: DC

## 2014-04-08 NOTE — Progress Notes (Signed)
Pre visit review using our clinic review tool, if applicable. No additional management support is needed unless otherwise documented below in the visit note. 

## 2014-04-08 NOTE — Patient Instructions (Signed)
Labs prior lipid, tsh  Cholesterol Cholesterol is a white, waxy, fat-like protein needed by your body in small amounts. The liver makes all the cholesterol you need. It is carried from the liver by the blood through the blood vessels. Deposits (plaque) may build up on blood vessel walls. This makes the arteries narrower and stiffer. Plaque increases the risk for heart attack and stroke. You cannot feel your cholesterol level even if it is very high. The only way to know is by a blood test to check your lipid (fats) levels. Once you know your cholesterol levels, you should keep a record of the test results. Work with your caregiver to to keep your levels in the desired range. WHAT THE RESULTS MEAN:  Total cholesterol is a rough measure of all the cholesterol in your blood.  LDL is the so-called bad cholesterol. This is the type that deposits cholesterol in the walls of the arteries. You want this level to be low.  HDL is the good cholesterol because it cleans the arteries and carries the LDL away. You want this level to be high.  Triglycerides are fat that the body can either burn for energy or store. High levels are closely linked to heart disease. DESIRED LEVELS:  Total cholesterol below 200.  LDL below 100 for people at risk, below 70 for very high risk.  HDL above 50 is good, above 60 is best.  Triglycerides below 150. HOW TO LOWER YOUR CHOLESTEROL:  Diet.  Choose fish or white meat chicken and [place], roasted or baked. Limit fatty cuts of red meat, fried foods, and processed meats, such as sausage and lunch meat.  Eat lots of fresh fruits and vegetables. Choose whole grains, beans, pasta, potatoes and cereals.  Use only small amounts of olive, corn or canola oils. Avoid butter, mayonnaise, shortening or palm kernel oils. Avoid foods with trans-fats.  Use skim/nonfat milk and low-fat/nonfat yogurt and cheeses. Avoid whole milk, cream, ice cream, egg yolks and cheeses. Healthy  desserts include angel food cake, ginger snaps, animal crackers, hard candy, popsicles, and low-fat/nonfat frozen yogurt. Avoid pastries, cakes, pies and cookies.  Exercise.  A regular program helps decrease LDL and raises HDL.  Helps with weight control.  Do things that increase your activity level like gardening, walking, or taking the stairs.  Medication.  May be prescribed by your caregiver to help lowering cholesterol and the risk for heart disease.  You may need medicine even if your levels are normal if you have several risk factors. HOME CARE INSTRUCTIONS   Follow your diet and exercise programs as suggested by your caregiver.  Take medications as directed.  Have blood work done when your caregiver feels it is necessary. MAKE SURE YOU:   Understand these instructions.  Will watch your condition.  Will get help right away if you are not doing well or get worse. Document Released: 08/09/2001 Document Revised: 02/06/2012 Document Reviewed: 08/28/2013 [facility] Patient Information 2014 [place], [place].

## 2014-04-13 ENCOUNTER — Encounter: Payer: Self-pay | Admitting: Family Medicine

## 2014-04-13 DIAGNOSIS — B001 Herpesviral vesicular dermatitis: Secondary | ICD-10-CM | POA: Insufficient documentation

## 2014-04-13 NOTE — Assessment & Plan Note (Signed)
Given prescription for Acyclovir. Report if no response

## 2014-04-13 NOTE — Assessment & Plan Note (Signed)
Tolerating Arimedex. 

## 2014-04-13 NOTE — Assessment & Plan Note (Signed)
Encouraged heart healthy diet, increase exercise, avoid trans fats, consider a krill oil cap daily 

## 2014-04-13 NOTE — Assessment & Plan Note (Signed)
Refill given on Lexapro. Good response

## 2014-04-13 NOTE — Progress Notes (Signed)
Patient ID: [name], female   DOB: [birth year], 63 y.o.   MRN: [id number] [name] [id number] [birth year] 04/13/2014      Progress Note-Follow Up  Subjective  Chief Complaint  Chief Complaint  Patient presents with  . Medication Refill    HPI  Patient is a 63 year old female in today for routine medical care. She is generally doing well. No recent illness. Continues to follow with oncology. Is tolerating her Arimedex. Denies CP/palp/SOB/HA/congestion/fevers/GI or GU c/o. Taking meds as prescribed   Past Medical History  Diagnosis Date  . Cancer 3500,9381  . Chicken pox   . Left hip pain 05/04/2011  . Colon polyps   . Dyslipidemia   . Tobacco abuse 05/05/2011  . Osteoporosis 02/07/2012  . Depression with anxiety 09/07/2012  . Elevated BP 09/09/2012  . Preventative health care 09/09/2012  . H/O tobacco use, presenting hazards to health 05/05/2011    Past Surgical History  Procedure Laterality Date  . Appendectomy  2010  . Breast lumpectomy  1998    right  . Breast surgery  07/08/11    right mastectomy+axlnd, reconstruction,T1cN1a,ERPR+,HER2-  . Breast surgery  march,2013    breast implant deflated    Family History  Problem Relation Age of Onset  . Diabetes Mother     type 2  . Obesity Mother   . Other Mother   . Cancer Father     ?  Marland Kitchen Other Maternal Grandmother     heart palpitations  . Other Paternal Grandmother     heart arrythmia/ palpitations  . Colon cancer Neg Hx   . Esophageal cancer Neg Hx   . Rectal cancer Neg Hx   . Stomach cancer Neg Hx     History   Social History  . Marital Status: Married    Spouse Name: N/A    Number of Children: N/A  . Years of Education: N/A   Occupational History  . Not on file.   Social History Main Topics  . Smoking status: Former Research scientist (life sciences)  . Smokeless tobacco: Never Used  . Alcohol Use: Yes     Comment: occasionally  . Drug Use: No  . Sexual Activity: No   Other Topics Concern  . Not on file    Social History Narrative  . No narrative on file    Current Outpatient Prescriptions on File Prior to Visit  Medication Sig Dispense Refill  . anastrozole (ARIMIDEX) 1 MG tablet Take 1 tablet (1 mg total) by mouth daily.  30 tablet  5  . calcium-vitamin D (OSCAL WITH D) 500-200 MG-UNIT per tablet Take 1 tablet by mouth 2 (two) times daily.      Marland Kitchen gabapentin (NEURONTIN) 300 MG capsule TAKE ONE CAPSULE BY MOUTH AT BEDTIME  30 capsule  8  . ibuprofen (ADVIL,MOTRIN) 400 MG tablet Take 400 mg by mouth every 6 (six) hours as needed for pain.       No current facility-administered medications on file prior to visit.    No Known Allergies  Review of Systems  Review of Systems  Constitutional: Negative for fever and malaise/fatigue.  HENT: Negative for congestion.   Eyes: Negative for discharge.  Respiratory: Negative for shortness of breath.   Cardiovascular: Negative for chest pain, palpitations and leg swelling.  Gastrointestinal: Negative for nausea, abdominal pain and diarrhea.  Genitourinary: Negative for dysuria.  Musculoskeletal: Negative for falls.  Skin: Negative for rash.  Neurological: Negative for loss of consciousness and headaches.  Endo/Heme/Allergies:  Negative for polydipsia.  Psychiatric/Behavioral: Negative for depression and suicidal ideas. The patient is not nervous/anxious and does not have insomnia.     Objective  BP 118/68  Pulse 62  Temp(Src) 99.1 F (37.3 C) (Oral)  Ht 5' 6"  (1.676 m)  Wt 158 lb 1.9 oz (71.723 kg)  BMI 25.53 kg/m2  SpO2 96%  Physical Exam  Physical Exam  Constitutional: She is oriented to person, place, and time and well-developed, well-nourished, and in no distress. No distress.  HENT:  Head: Normocephalic and atraumatic.  Eyes: Conjunctivae are normal.  Neck: Neck supple. No thyromegaly present.  Cardiovascular: Normal rate, regular rhythm and normal heart sounds.   No murmur heard. Pulmonary/Chest: Effort normal and breath  sounds normal. She has no wheezes.  Abdominal: She exhibits no distension and no mass.  Musculoskeletal: She exhibits no edema.  Lymphadenopathy:    She has no cervical adenopathy.  Neurological: She is alert and oriented to person, place, and time.  Skin: Skin is warm and dry. No rash noted. She is not diaphoretic.  Psychiatric: Memory, affect and judgment normal.    Lab Results  Component Value Date   TSH 1.638 08/29/2013   Lab Results  Component Value Date   WBC 6.9 02/17/2014   HGB 12.4 02/17/2014   HCT 37.9 02/17/2014   MCV 90.6 02/17/2014   PLT 282 02/17/2014   Lab Results  Component Value Date   CREATININE 0.7 02/17/2014   BUN 17.9 02/17/2014   NA 138 02/17/2014   K 4.0 02/17/2014   CL 100 11/01/2013   CO2 26 02/17/2014   Lab Results  Component Value Date   ALT 15 02/17/2014   AST 15 02/17/2014   ALKPHOS 54 02/17/2014   BILITOT 0.31 02/17/2014   Lab Results  Component Value Date   CHOL 215* 08/29/2013   Lab Results  Component Value Date   HDL 45 08/29/2013   Lab Results  Component Value Date   LDLCALC 144* 08/29/2013   Lab Results  Component Value Date   TRIG 129 08/29/2013   Lab Results  Component Value Date   CHOLHDL 4.8 08/29/2013     Assessment & Plan  Elevated BP Well controlled. Encouraged heart healthy diet such as the DASH diet and exercise as tolerated.   Depression with anxiety Refill given on Lexapro. Good response  Dyslipidemia Encouraged heart healthy diet, increase exercise, avoid trans fats, consider a krill oil cap daily  Recurrent cold sores Given prescription for Acyclovir. Report if no response  Breast cancer, right breast Tolerating Arimedex

## 2014-04-13 NOTE — Assessment & Plan Note (Signed)
Well controlled. Encouraged heart healthy diet such as the DASH diet and exercise as tolerated.  

## 2014-05-03 ENCOUNTER — Other Ambulatory Visit: Payer: Self-pay | Admitting: Physician Assistant

## 2014-05-03 DIAGNOSIS — C50911 Malignant neoplasm of unspecified site of right female breast: Secondary | ICD-10-CM

## 2014-07-14 LAB — HM MAMMOGRAPHY: HM MAMMO: NEGATIVE

## 2014-07-15 ENCOUNTER — Encounter: Payer: Self-pay | Admitting: Family Medicine

## 2014-07-22 ENCOUNTER — Other Ambulatory Visit: Payer: Self-pay | Admitting: *Deleted

## 2014-07-22 DIAGNOSIS — C50911 Malignant neoplasm of unspecified site of right female breast: Secondary | ICD-10-CM

## 2014-07-23 ENCOUNTER — Other Ambulatory Visit (HOSPITAL_BASED_OUTPATIENT_CLINIC_OR_DEPARTMENT_OTHER): Payer: 59

## 2014-07-23 DIAGNOSIS — C50911 Malignant neoplasm of unspecified site of right female breast: Secondary | ICD-10-CM

## 2014-07-23 DIAGNOSIS — C50519 Malignant neoplasm of lower-outer quadrant of unspecified female breast: Secondary | ICD-10-CM

## 2014-07-23 LAB — COMPREHENSIVE METABOLIC PANEL (CC13)
ALK PHOS: 65 U/L (ref 40–150)
ALT: 14 U/L (ref 0–55)
AST: 16 U/L (ref 5–34)
Albumin: 3.9 g/dL (ref 3.5–5.0)
Anion Gap: 11 mEq/L (ref 3–11)
BUN: 15.5 mg/dL (ref 7.0–26.0)
CO2: 27 meq/L (ref 22–29)
Calcium: 9.1 mg/dL (ref 8.4–10.4)
Chloride: 104 mEq/L (ref 98–109)
Creatinine: 0.8 mg/dL (ref 0.6–1.1)
Glucose: 97 mg/dl (ref 70–140)
Potassium: 3.3 mEq/L — ABNORMAL LOW (ref 3.5–5.1)
SODIUM: 141 meq/L (ref 136–145)
Total Bilirubin: 0.32 mg/dL (ref 0.20–1.20)
Total Protein: 7.4 g/dL (ref 6.4–8.3)

## 2014-07-23 LAB — CBC WITH DIFFERENTIAL/PLATELET
BASO%: 0.9 % (ref 0.0–2.0)
Basophils Absolute: 0.1 10*3/uL (ref 0.0–0.1)
EOS ABS: 0.1 10*3/uL (ref 0.0–0.5)
EOS%: 1.5 % (ref 0.0–7.0)
HCT: 40.4 % (ref 34.8–46.6)
HGB: 12.9 g/dL (ref 11.6–15.9)
LYMPH#: 2.3 10*3/uL (ref 0.9–3.3)
LYMPH%: 27.5 % (ref 14.0–49.7)
MCH: 29 pg (ref 25.1–34.0)
MCHC: 31.8 g/dL (ref 31.5–36.0)
MCV: 91 fL (ref 79.5–101.0)
MONO#: 0.6 10*3/uL (ref 0.1–0.9)
MONO%: 6.8 % (ref 0.0–14.0)
NEUT%: 63.3 % (ref 38.4–76.8)
NEUTROS ABS: 5.3 10*3/uL (ref 1.5–6.5)
Platelets: 308 10*3/uL (ref 145–400)
RBC: 4.44 10*6/uL (ref 3.70–5.45)
RDW: 13.4 % (ref 11.2–14.5)
WBC: 8.3 10*3/uL (ref 3.9–10.3)

## 2014-07-30 ENCOUNTER — Telehealth: Payer: Self-pay | Admitting: Nurse Practitioner

## 2014-07-30 ENCOUNTER — Ambulatory Visit (HOSPITAL_BASED_OUTPATIENT_CLINIC_OR_DEPARTMENT_OTHER): Payer: 59 | Admitting: Nurse Practitioner

## 2014-07-30 ENCOUNTER — Encounter: Payer: Self-pay | Admitting: Nurse Practitioner

## 2014-07-30 ENCOUNTER — Ambulatory Visit (HOSPITAL_BASED_OUTPATIENT_CLINIC_OR_DEPARTMENT_OTHER): Payer: 59

## 2014-07-30 ENCOUNTER — Other Ambulatory Visit: Payer: Self-pay | Admitting: *Deleted

## 2014-07-30 VITALS — BP 134/59 | HR 64 | Temp 98.0°F | Resp 18 | Ht 66.0 in | Wt 158.4 lb

## 2014-07-30 DIAGNOSIS — M810 Age-related osteoporosis without current pathological fracture: Secondary | ICD-10-CM

## 2014-07-30 DIAGNOSIS — C50911 Malignant neoplasm of unspecified site of right female breast: Secondary | ICD-10-CM

## 2014-07-30 DIAGNOSIS — C50519 Malignant neoplasm of lower-outer quadrant of unspecified female breast: Secondary | ICD-10-CM

## 2014-07-30 DIAGNOSIS — E876 Hypokalemia: Secondary | ICD-10-CM

## 2014-07-30 DIAGNOSIS — Z170 Estrogen receptor positive status [ER+]: Secondary | ICD-10-CM

## 2014-07-30 MED ORDER — SODIUM CHLORIDE 0.9 % IV SOLN
Freq: Once | INTRAVENOUS | Status: AC
  Administered 2014-07-30: 16:00:00 via INTRAVENOUS

## 2014-07-30 MED ORDER — SODIUM CHLORIDE 0.9 % IJ SOLN
3.0000 mL | Freq: Once | INTRAMUSCULAR | Status: DC | PRN
  Filled 2014-07-30: qty 10

## 2014-07-30 MED ORDER — ALTEPLASE 2 MG IJ SOLR
2.0000 mg | Freq: Once | INTRAMUSCULAR | Status: DC | PRN
  Filled 2014-07-30: qty 2

## 2014-07-30 MED ORDER — ANASTROZOLE 1 MG PO TABS: 1.0000 mg | ORAL_TABLET | Freq: Every day | ORAL | Status: DC

## 2014-07-30 MED ORDER — HEPARIN SOD (PORK) LOCK FLUSH 100 UNIT/ML IV SOLN
500.0000 [IU] | Freq: Once | INTRAVENOUS | Status: DC | PRN
  Filled 2014-07-30: qty 5

## 2014-07-30 MED ORDER — ZOLEDRONIC ACID 4 MG/100ML IV SOLN
4.0000 mg | Freq: Once | INTRAVENOUS | Status: AC
  Administered 2014-07-30: 4 mg via INTRAVENOUS
  Filled 2014-07-30: qty 100

## 2014-07-30 MED ORDER — GABAPENTIN 300 MG PO CAPS: ORAL_CAPSULE | ORAL | Status: DC

## 2014-07-30 NOTE — Telephone Encounter (Signed)
per pof to sch zometa in March-HF sent coprrected pof stating to sch only in Sept-sent MW an email to chge zometa to  to Sept-will contact pt once correction is made

## 2014-07-30 NOTE — Progress Notes (Signed)
ID: [name]   DOB: [birth year]  MR#: [id number]  CSN#:[id number]  PCP: Penni Homans, MD GYN:  SU:  OTHER MD: Marolyn Hammock, DDS  CHIEF COMPLAINT: right breast cancer CURRENT TREATMENT: anastrozole 1mg  daily  HISTORY OF PRESENT ILLNESS: The patient formally saw Dr. Truddie Coco for a remote history of right breast cancer status post lumpectomy and axillary lymph node dissection in August 1998.  This was a T1c N0, estrogen and progesterone receptor positive tumor, which was treated adjuvantly with doxorubicin and cyclophosphamide x4 followed by adjuvant radiation completed in March 1998 followed by 5 years of tamoxifen completed in January 2004.  More recently, screening mammography at [facility], June 2012, found a possible abnormality in the right breast.  The patient was brought back for additional views the next day and additional spot compression views were obtained, with the report not available in our system.  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   With this information, the patient was referred for bilateral breast MRIs, May 15, 2011.  In addition to the prior lumpectomy changes, there was an enhancing irregular mass in the lower-outer quadrant of the right breast measuring 1.6 cm.  The rest of the MRI exam was unremarkable except for a mildly prominent right axillary lymph node.  With this information, the patient was presented at the [facility] and the recommendation was made for mastectomy with an Oncotype to be sent at the same time.   The patient  did undergo right mastectomy, Dr. Marlou Starks, with sentinel lymph node biopsy, and immediate reconstruction with a saline implant and right latissimus myocutaneous flap under Crissie Reese, August 10th.  The pathology from this procedure ([id number]) confirmed a well-differentiated invasive ductal carcinoma measuring 1.5 cm with ample margins and 1/6 lymph nodes involved.  HER2 was repeated and was again negative.   With this information, the patient was seen by Dr. Truddie Coco and started on anastrozole as well as Delton See given her history of osteoporosis.  She has also been seen by Genetics and had an Oncotype DX as noted below. She established herself in my service October 2012.  INTERVAL HISTORY: Roxy returns today for follow up of her right breast cancer. The interval history is unremarkable. She has been on anastrozole since November 2012 and tolerates it well. She has hot flashes that are managed well by gabapentin, and she denies vaginal changes. She would like my input on a mole on the back of her left arm.  REVIEW OF SYSTEMS: Lanissa denies fevers, chills, nausea, vomiting, or a change in bowel or bladder habits. She denies shortness of breath, chest pain, cough, or fatigue. She has generalized achyness to her joints due to arthritis, but this is managed with NSAIDs. A detailed review of symptoms was otherwise negative.  PAST MEDICAL HISTORY: Past Medical History  Diagnosis Date  . Cancer 3536,1443  . Chicken pox   . Left hip pain 05/04/2011  . Colon polyps   . Dyslipidemia   . Tobacco abuse 05/05/2011  . Osteoporosis 02/07/2012  . Depression with anxiety 09/07/2012  . Elevated BP 09/09/2012  . Preventative health  care 09/09/2012  . H/O tobacco use, presenting hazards to health 05/05/2011  Significant for appendectomy remotely, history of osteopenia/osteoporosis, history of dyslipidemia, and history of 15 pack-year tobacco abuse.   PAST SURGICAL HISTORY: Past Surgical History  Procedure Laterality  Date  . Appendectomy  2010  . Breast lumpectomy  1998    right  . Breast surgery  07/08/11    right mastectomy+axlnd, reconstruction,T1cN1a,ERPR+,HER2-  . Breast surgery  march,2013    breast implant deflated    FAMILY HISTORY Family History  Problem Relation Age of Onset  . Diabetes Mother     type 2  . Obesity Mother   . Other Mother   . Cancer Father     ?  Marland Kitchen Other Maternal Grandmother     heart palpitations  . Other Paternal Grandmother     heart arrythmia/ palpitations  . Colon cancer Neg Hx   . Esophageal cancer Neg Hx   . Rectal cancer Neg Hx   . Stomach cancer Neg Hx   This is essentially noncontributory.  The patient's father did die from cancer but they do not know the primary.  It seems to have been most likely a lung cancer that was widely metastatic at diagnosis.  He was 63 years old at that time.  Otherwise, there is no history of breast or ovarian cancer in the patient's family  GYNECOLOGIC HISTORY: She had menarche, age 22.  Menopause at age 74.  She is GX, P2.  First pregnancy to term, age 8.  She never took hormone replacement.  SOCIAL HISTORY: (Updated 08/19/2013) She works in Airline pilot.  Her husband Rocky Morel") [place] has a history of prostate cancer, was in Holiday representative but is now retired.  They have 2 children, a son and a daughter, both living in this area and 2 grandchildren.  The patient is not a church attender.   ADVANCED DIRECTIVES:  HEALTH MAINTENANCE: (Updated 08/19/2013) History  Substance Use Topics  . Smoking status: Former Games developer  . Smokeless tobacco: Never Used  . Alcohol Use: Yes     Comment: occasionally  As noted, the patient had a 15 pack-year tobacco abuse history but has quit.  There is no history of alcohol abuse.     Colonoscopy:  05/17/2013, Dr. Russella Dar  PAP: June 2012, Dr. Oletha Blend  Bone density: 02/19/2013, [facility], osteoporosis with a T score of -2.5  Lipid panel: UTD, Dr. Rogelia Rohrer  No Known Allergies  Current Outpatient  Prescriptions  Medication Sig Dispense Refill  . anastrozole (ARIMIDEX) 1 MG tablet TAKE ONE TABLET BY MOUTH ONE TIME DAILY   30 tablet  2  . calcium-vitamin D (OSCAL WITH D) 500-200 MG-UNIT per tablet Take 1 tablet by mouth 2 (two) times daily.      Marland Kitchen escitalopram (LEXAPRO) 10 MG tablet Take 1 tablet (10 mg total) by mouth daily.  30 tablet  5  . gabapentin (NEURONTIN) 300 MG capsule TAKE ONE CAPSULE BY MOUTH AT BEDTIME  30 capsule  4  . ibuprofen (ADVIL,MOTRIN) 400 MG tablet Take 400 mg by mouth every 6 (six) hours as needed for pain.      Marland Kitchen LORazepam (ATIVAN) 0.5 MG tablet Take 1 tablet (0.5 mg total) by mouth 2 (two) times daily as needed for anxiety.  30 tablet  2  . acyclovir (ZOVIRAX) 400 MG tablet Take 1 tablet (400 mg total) by mouth 3 (three) times daily. X 5 days  15 tablet  2  . anastrozole (ARIMIDEX) 1 MG tablet Take  1 tablet (1 mg total) by mouth daily.  30 tablet  3   No current facility-administered medications for this visit.    OBJECTIVE: Middle-aged white woman in no acute distress Filed Vitals:   07/30/14 1427  BP: 134/59  Pulse: 64  Temp: 98 F (36.7 C)  Resp: 18     Body mass index is 25.58 kg/(m^2).    ECOG FS: 0 Filed Weights   07/30/14 1427  Weight: 158 lb 6.4 oz (71.85 kg)   Skin: warm, dry, 1 cm skin lesion reminiscent of seborrheic keratosis on back of left upper arm, brown, no inflammation or redness HEENT: sclerae anicteric, conjunctivae pink, oropharynx clear. No thrush or mucositis.  Lymph Nodes: No cervical or supraclavicular lymphadenopathy  Lungs: clear to auscultation bilaterally, no rales, wheezes, or rhonci  Heart: regular rate and rhythm  Abdomen: round, soft, non tender, positive bowel sounds  Musculoskeletal: No focal spinal tenderness, no peripheral edema  Neuro: non focal, well oriented, positive affect  Breasts: right breast status post mastectomy and reconstruction. No evidence of local recurrence. Right axilla benign. Left breast  unremarkable.    LAB RESULTS: Lab Results  Component Value Date   WBC 8.3 07/23/2014   NEUTROABS 5.3 07/23/2014   HGB 12.9 07/23/2014   HCT 40.4 07/23/2014   MCV 91.0 07/23/2014   PLT 308 07/23/2014      Chemistry      Component Value Date/Time   NA 141 07/23/2014 1547   NA 137 11/01/2013 1537   K 3.3* 07/23/2014 1547   K 4.2 11/01/2013 1537   CL 100 11/01/2013 1537   CL 103 02/12/2013 1234   CO2 27 07/23/2014 1547   CO2 29 11/01/2013 1537   BUN 15.5 07/23/2014 1547   BUN 13 11/01/2013 1537   CREATININE 0.8 07/23/2014 1547   CREATININE 0.55 11/01/2013 1537   CREATININE 0.58 02/07/2012 0908      Component Value Date/Time   CALCIUM 9.1 07/23/2014 1547   CALCIUM 8.9 11/01/2013 1537   ALKPHOS 65 07/23/2014 1547   ALKPHOS 62 08/29/2013 1351   AST 16 07/23/2014 1547   AST 15 08/29/2013 1351   ALT 14 07/23/2014 1547   ALT 14 08/29/2013 1351   BILITOT 0.32 07/23/2014 1547   BILITOT 0.3 08/29/2013 1351    Results for ALISSANDRA, GEOFFROY (MRN [id number]) as of 02/26/2014 19:29  Ref. Range 08/07/2012 10:19 08/14/2013 14:30 02/17/2014 16:02  Vit D, 25-Hydroxy Latest Range: 30-89 ng/mL 30 38 35    STUDIES: Most recent mammogram on 07/18/14 unremarkable.  Most recent bone density at [facility] on 02/19/2013 showed osteoporosis, but with some improvement in bone density when compared to prior studies.   ASSESSMENT: 63 y.o. [facility] woman status post:  (1) Right lumpectomy and axillary lymph node dissection  1998, for a T1c N0, estrogen receptor positive breast cancer, treated adjuvantly with doxorubicin and cyclophosphamide times 4, followed by radiation then tamoxifen for 5 years   (2) Right mastectomy with immediate implant and latissimus flap reconstruction, August 2012, for a T1c N1, grade 1, invasive ductal carcinoma, estrogen and progesterone receptor positive, HER2 negative, with an MIB1 of 13%.  (3) her Oncotype DX  recurrence score was 14 predicting for a 9% risk of distant recurrence within 10 years if  she took 5 years of tamoxifen.    (4) on anastrozole as of November 2012  (5) with a history of osteoporosis, on denosumab initially, switched to zolendronic acid March 2013 because of possible further reduction  in breast cancer recurrence, to continue every 6 months for 2 years, then yearly beginning September 2014, and continuing while on anastrozole.     PLAN:  Elah is doing well as far as her breast cancer is concerned. The labs were reviewed in detail with her and were unremarkable with the exception of her potassium. We discussed potassium rich foods and she will find ways to include them in her diet in the future. We will continue the anastrozole until November 2017.   Zabria demonstrated osteoporosis on her last bone density scan in March 2014. She will continue to have yearly zometa infusions and will have a repeat bone density scan in march 2016.   The skin lesion to Solina's upper left arm looks like seborrehic keratosis, and thus does not look malignant. I advised her to follow up with her dermatologist for further review. She will return next September for labs, an office visit, and another zometa infusion. Tamecca understands and agrees with this plan. She has been encouraged to call with any issues that might arise before her next visit here.  Marcelino Duster    07/30/2014

## 2014-07-30 NOTE — Patient Instructions (Signed)

## 2014-07-31 ENCOUNTER — Telehealth: Payer: Self-pay | Admitting: *Deleted

## 2014-07-31 ENCOUNTER — Telehealth: Payer: Self-pay | Admitting: Oncology

## 2014-07-31 NOTE — Telephone Encounter (Signed)
Per staff message and POF I have scheduled appts. Advised scheduler of appts. JMW  

## 2014-07-31 NOTE — Telephone Encounter (Signed)
, °

## 2014-10-09 ENCOUNTER — Encounter: Payer: Self-pay | Admitting: Family Medicine

## 2014-10-09 ENCOUNTER — Ambulatory Visit (INDEPENDENT_AMBULATORY_CARE_PROVIDER_SITE_OTHER): Payer: 59 | Admitting: Family Medicine

## 2014-10-09 VITALS — BP 114/68 | HR 72 | Temp 98.7°F | Ht 66.0 in | Wt 161.2 lb

## 2014-10-09 DIAGNOSIS — IMO0001 Reserved for inherently not codable concepts without codable children: Secondary | ICD-10-CM

## 2014-10-09 DIAGNOSIS — F329 Major depressive disorder, single episode, unspecified: Secondary | ICD-10-CM

## 2014-10-09 DIAGNOSIS — J329 Chronic sinusitis, unspecified: Secondary | ICD-10-CM

## 2014-10-09 DIAGNOSIS — F418 Other specified anxiety disorders: Secondary | ICD-10-CM

## 2014-10-09 DIAGNOSIS — R030 Elevated blood-pressure reading, without diagnosis of hypertension: Secondary | ICD-10-CM

## 2014-10-09 DIAGNOSIS — Z0000 Encounter for general adult medical examination without abnormal findings: Secondary | ICD-10-CM

## 2014-10-09 DIAGNOSIS — E785 Hyperlipidemia, unspecified: Secondary | ICD-10-CM

## 2014-10-09 DIAGNOSIS — J209 Acute bronchitis, unspecified: Secondary | ICD-10-CM

## 2014-10-09 DIAGNOSIS — F419 Anxiety disorder, unspecified: Secondary | ICD-10-CM

## 2014-10-09 MED ORDER — ESCITALOPRAM OXALATE 10 MG PO TABS: 10.0000 mg | ORAL_TABLET | Freq: Every day | ORAL | Status: DC

## 2014-10-09 MED ORDER — CEFDINIR 300 MG PO CAPS: 300.0000 mg | ORAL_CAPSULE | Freq: Two times a day (BID) | ORAL | Status: AC

## 2014-10-09 MED ORDER — LORAZEPAM 0.5 MG PO TABS: 0.5000 mg | ORAL_TABLET | Freq: Two times a day (BID) | ORAL | Status: DC | PRN

## 2014-10-09 NOTE — Patient Instructions (Signed)
Encouraged increased rest and hydration, add probiotics, zinc such as Coldeze or Xicam. Treat fevers as needed. Plain Mucinex twice daily for 10 days. Start the antibiotic if symptoms worsen or no improvement   Upper Respiratory Infection, Adult An upper respiratory infection (URI) is also sometimes known as the common cold. The upper respiratory tract includes the nose, sinuses, throat, trachea, and bronchi. Bronchi are the airways leading to the lungs. Most people improve within 1 week, but symptoms can last up to 2 weeks. A residual cough may last even longer.  CAUSES Many different viruses can infect the tissues lining the upper respiratory tract. The tissues become irritated and inflamed and often become very moist. Mucus production is also common. A cold is contagious. You can easily spread the virus to others by oral contact. This includes kissing, sharing a glass, coughing, or sneezing. Touching your mouth or nose and then touching a surface, which is then touched by another person, can also spread the virus. SYMPTOMS  Symptoms typically develop 1 to 3 days after you come in contact with a cold virus. Symptoms vary from person to person. They may include:  Runny nose.  Sneezing.  Nasal congestion.  Sinus irritation.  Sore throat.  Loss of voice (laryngitis).  Cough.  Fatigue.  Muscle aches.  Loss of appetite.  Headache.  Low-grade fever. DIAGNOSIS  You might diagnose your own cold based on familiar symptoms, since most people get a cold 2 to 3 times a year. Your caregiver can confirm this based on your exam. Most importantly, your caregiver can check that your symptoms are not due to another disease such as strep throat, sinusitis, pneumonia, asthma, or epiglottitis. Blood tests, throat tests, and X-rays are not necessary to diagnose a common cold, but they may sometimes be helpful in excluding other more serious diseases. Your caregiver will decide if any further tests are  required. RISKS AND COMPLICATIONS  You may be at risk for a more severe case of the common cold if you smoke cigarettes, have chronic heart disease (such as heart failure) or lung disease (such as asthma), or if you have a weakened immune system. The very young and very old are also at risk for more serious infections. Bacterial sinusitis, middle ear infections, and bacterial pneumonia can complicate the common cold. The common cold can worsen asthma and chronic obstructive pulmonary disease (COPD). Sometimes, these complications can require emergency medical care and may be life-threatening. PREVENTION  The best way to protect against getting a cold is to practice good hygiene. Avoid oral or hand contact with people with cold symptoms. Wash your hands often if contact occurs. There is no clear evidence that vitamin C, vitamin E, echinacea, or exercise reduces the chance of developing a cold. However, it is always recommended to get plenty of rest and practice good nutrition. TREATMENT  Treatment is directed at relieving symptoms. There is no cure. Antibiotics are not effective, because the infection is caused by a virus, not by bacteria. Treatment may include:  Increased fluid intake. Sports drinks offer valuable electrolytes, sugars, and fluids.  Breathing heated mist or steam (vaporizer or shower).  Eating chicken soup or other clear broths, and maintaining good nutrition.  Getting plenty of rest.  Using gargles or lozenges for comfort.  Controlling fevers with ibuprofen or acetaminophen as directed by your caregiver.  Increasing usage of your inhaler if you have asthma. Zinc gel and zinc lozenges, taken in the first 24 hours of the common cold, can  shorten the duration and lessen the severity of symptoms. Pain medicines may help with fever, muscle aches, and throat pain. A variety of non-prescription medicines are available to treat congestion and runny nose. Your caregiver can make  recommendations and may suggest nasal or lung inhalers for other symptoms.  HOME CARE INSTRUCTIONS   Only take over-the-counter or prescription medicines for pain, discomfort, or fever as directed by your caregiver.  Use a warm mist humidifier or inhale steam from a shower to increase air moisture. This may keep secretions moist and make it easier to breathe.  Drink enough water and fluids to keep your urine clear or pale yellow.  Rest as needed.  Return to work when your temperature has returned to normal or as your caregiver advises. You may need to stay home longer to avoid infecting others. You can also use a face mask and careful hand washing to prevent spread of the virus. SEEK MEDICAL CARE IF:   After the first few days, you feel you are getting worse rather than better.  You need your caregiver's advice about medicines to control symptoms.  You develop chills, worsening shortness of breath, or brown or red sputum. These may be signs of pneumonia.  You develop yellow or brown nasal discharge or pain in the face, especially when you bend forward. These may be signs of sinusitis.  You develop a fever, swollen neck glands, pain with swallowing, or white areas in the back of your throat. These may be signs of strep throat. SEEK IMMEDIATE MEDICAL CARE IF:   You have a fever.  You develop severe or persistent headache, ear pain, sinus pain, or chest pain.  You develop wheezing, a prolonged cough, cough up blood, or have a change in your usual mucus (if you have chronic lung disease).  You develop sore muscles or a stiff neck. Document Released: 05/10/2001 Document Revised: 02/06/2012 Document Reviewed: 02/19/2014 [facility] Patient Information 2015 [place], [place]. This information is not intended to replace advice given to you by your health care provider. Make sure you discuss any questions you have with your health care provider.

## 2014-10-09 NOTE — Progress Notes (Signed)
Pre visit review using our clinic review tool, if applicable. No additional management support is needed unless otherwise documented below in the visit note. 

## 2014-10-09 NOTE — Progress Notes (Signed)
Patient ID: CIGI BEGA, female   DOB: 15-Sep-1951, 63 y.o.   MRN: [id number] AITHANA KUSHNER [id number] Jul 18, 1951 10/09/2014      Progress Note-Follow Up  Subjective  Chief Complaint  Chief Complaint  Patient presents with  . Annual Exam    physical    HPI  Patient is a 63 year old femal in today for routine medical care. Patient is struggling with some congestion and malaise. Has had symptoms roughly a week with ear and nasal congestion. No fevers, chills. No other recent illness. Denies CP/palp/SOB/HA/congestion/fevers/GI or GU c/o. Taking meds as prescribed  Past Medical History  Diagnosis Date  . Cancer 8099,8338  . Chicken pox   . Left hip pain 05/04/2011  . Colon polyps   . Dyslipidemia   . Tobacco abuse 05/05/2011  . Osteoporosis 02/07/2012  . Depression with anxiety 09/07/2012  . Elevated BP 09/09/2012  . Preventative health care 09/09/2012  . H/O tobacco use, presenting hazards to health 05/05/2011    Past Surgical History  Procedure Laterality Date  . Appendectomy  2010  . Breast lumpectomy  1998    right  . Breast surgery  07/08/11    right mastectomy+axlnd, reconstruction,T1cN1a,ERPR+,HER2-  . Breast surgery  march,2013    breast implant deflated    Family History  Problem Relation Age of Onset  . Diabetes Mother     type 2  . Obesity Mother   . Other Mother   . Cancer Father     ?  Marland Kitchen Other Maternal Grandmother     heart palpitations  . Other Paternal Grandmother     heart arrythmia/ palpitations  . Colon cancer Neg Hx   . Esophageal cancer Neg Hx   . Rectal cancer Neg Hx   . Stomach cancer Neg Hx     History   Social History  . Marital Status: Married    Spouse Name: N/A    Number of Children: N/A  . Years of Education: N/A   Occupational History  . Not on file.   Social History Main Topics  . Smoking status: Former Research scientist (life sciences)  . Smokeless tobacco: Never Used  . Alcohol Use: Yes     Comment: occasionally  . Drug Use: No  . Sexual  Activity: No   Other Topics Concern  . Not on file   Social History Narrative    Current Outpatient Prescriptions on File Prior to Visit  Medication Sig Dispense Refill  . acyclovir (ZOVIRAX) 400 MG tablet Take 1 tablet (400 mg total) by mouth 3 (three) times daily. X 5 days 15 tablet 2  . anastrozole (ARIMIDEX) 1 MG tablet TAKE ONE TABLET BY MOUTH ONE TIME DAILY  30 tablet 2  . anastrozole (ARIMIDEX) 1 MG tablet Take 1 tablet (1 mg total) by mouth daily. 30 tablet 3  . calcium-vitamin D (OSCAL WITH D) 500-200 MG-UNIT per tablet Take 1 tablet by mouth 2 (two) times daily.    Marland Kitchen escitalopram (LEXAPRO) 10 MG tablet Take 1 tablet (10 mg total) by mouth daily. 30 tablet 5  . gabapentin (NEURONTIN) 300 MG capsule TAKE ONE CAPSULE BY MOUTH AT BEDTIME 30 capsule 4  . ibuprofen (ADVIL,MOTRIN) 400 MG tablet Take 400 mg by mouth every 6 (six) hours as needed for pain.    Marland Kitchen LORazepam (ATIVAN) 0.5 MG tablet Take 1 tablet (0.5 mg total) by mouth 2 (two) times daily as needed for anxiety. 30 tablet 2   No current facility-administered medications on file prior  to visit.    No Known Allergies  Review of Systems  Review of Systems  Constitutional: Positive for malaise/fatigue. Negative for fever and chills.  HENT: Positive for congestion and ear pain. Negative for hearing loss and nosebleeds.   Eyes: Negative for discharge.  Respiratory: Positive for cough and sputum production. Negative for shortness of breath and wheezing.   Cardiovascular: Negative for chest pain, palpitations, leg swelling and PND.  Gastrointestinal: Negative for heartburn, nausea, vomiting, abdominal pain, diarrhea, constipation and blood in stool.  Genitourinary: Negative for dysuria, urgency, frequency and hematuria.  Musculoskeletal: Positive for myalgias. Negative for back pain and falls.  Skin: Negative for rash.  Neurological: Positive for headaches. Negative for dizziness, tremors, sensory change, focal weakness, loss  of consciousness and weakness.  Endo/Heme/Allergies: Negative for polydipsia. Does not bruise/bleed easily.  Psychiatric/Behavioral: Negative for depression and suicidal ideas. The patient is not nervous/anxious and does not have insomnia.     Objective  BP 123/96 mmHg  Pulse 72  Temp(Src) 98.7 F (37.1 C) (Oral)  Ht 5' 6"  (1.676 m)  Wt 161 lb 3.2 oz (73.12 kg)  BMI 26.03 kg/m2  SpO2 100%  Physical Exam  Physical Exam  Constitutional: She is oriented to person, place, and time and well-developed, well-nourished, and in no distress. No distress.  HENT:  Head: Normocephalic and atraumatic.  Eyes: Conjunctivae are normal.  Neck: Neck supple. No thyromegaly present.  Cardiovascular: Normal rate, regular rhythm and normal heart sounds.   No murmur heard. Pulmonary/Chest: Effort normal and breath sounds normal. She has no wheezes.  Rhonchi right base  Abdominal: She exhibits no distension and no mass.  Musculoskeletal: She exhibits no edema.  Lymphadenopathy:    She has no cervical adenopathy.  Neurological: She is alert and oriented to person, place, and time.  Skin: Skin is warm and dry. No rash noted. She is not diaphoretic.  Psychiatric: Memory, affect and judgment normal.    Lab Results  Component Value Date   TSH 1.638 08/29/2013   Lab Results  Component Value Date   WBC 8.3 07/23/2014   HGB 12.9 07/23/2014   HCT 40.4 07/23/2014   MCV 91.0 07/23/2014   PLT 308 07/23/2014   Lab Results  Component Value Date   CREATININE 0.8 07/23/2014   BUN 15.5 07/23/2014   NA 141 07/23/2014   K 3.3* 07/23/2014   CL 100 11/01/2013   CO2 27 07/23/2014   Lab Results  Component Value Date   ALT 14 07/23/2014   AST 16 07/23/2014   ALKPHOS 65 07/23/2014   BILITOT 0.32 07/23/2014   Lab Results  Component Value Date   CHOL 215* 08/29/2013   Lab Results  Component Value Date   HDL 45 08/29/2013   Lab Results  Component Value Date   LDLCALC 144* 08/29/2013   Lab  Results  Component Value Date   TRIG 129 08/29/2013   Lab Results  Component Value Date   CHOLHDL 4.8 08/29/2013     Assessment & Plan  Elevated BP Well controlled. Encouraged heart healthy diet such as the DASH diet and exercise as tolerated.   Dyslipidemia Encouraged heart healthy diet, increase exercise, avoid trans fats, consider a krill oil cap daily  Preventative health care Patient encouraged to maintain heart healthy diet, regular exercise, adequate sleep. Consider daily probiotics. Take medications as prescribed  Acute bronchitis Encouraged increased rest and hydration, add probiotics, zinc such as Coldeze or Xicam. Treat fevers as needed. If no improvement given a  course of Cefdinir to start

## 2014-10-10 LAB — LIPID PANEL
CHOL/HDL RATIO: 5
Cholesterol: 190 mg/dL (ref 0–200)
HDL: 37.9 mg/dL — AB (ref >39.00)
LDL CALC: 139 mg/dL — AB (ref 0–99)
NONHDL: 152.1
Triglycerides: 65 mg/dL (ref 0.0–149.0)
VLDL: 13 mg/dL (ref 0.0–40.0)

## 2014-10-10 LAB — RENAL FUNCTION PANEL
ALBUMIN: 3.4 g/dL — AB (ref 3.5–5.2)
BUN: 11 mg/dL (ref 6–23)
CALCIUM: 8.8 mg/dL (ref 8.4–10.5)
CHLORIDE: 102 meq/L (ref 96–112)
CO2: 25 meq/L (ref 19–32)
CREATININE: 0.6 mg/dL (ref 0.4–1.2)
GFR: 107.13 mL/min (ref >60.00)
Glucose, Bld: 89 mg/dL (ref 70–99)
Phosphorus: 3.4 mg/dL (ref 2.3–4.6)
Potassium: 3.7 mEq/L (ref 3.5–5.1)
Sodium: 138 mEq/L (ref 135–145)

## 2014-10-10 LAB — TSH: TSH: 1.5 u[IU]/mL (ref 0.35–4.50)

## 2014-10-12 NOTE — Assessment & Plan Note (Signed)
Encouraged heart healthy diet, increase exercise, avoid trans fats, consider a krill oil cap daily 

## 2014-10-12 NOTE — Assessment & Plan Note (Addendum)
Well controlled. Encouraged heart healthy diet such as the DASH diet and exercise as tolerated.  

## 2014-10-12 NOTE — Assessment & Plan Note (Signed)
Patient encouraged to maintain heart healthy diet, regular exercise, adequate sleep. Consider daily probiotics. Take medications as prescribed 

## 2014-10-13 DIAGNOSIS — J209 Acute bronchitis, unspecified: Secondary | ICD-10-CM | POA: Insufficient documentation

## 2014-10-13 NOTE — Assessment & Plan Note (Signed)
Encouraged increased rest and hydration, add probiotics, zinc such as Coldeze or Xicam. Treat fevers as needed. If no improvement given a course of Cefdinir to start

## 2015-02-03 ENCOUNTER — Ambulatory Visit: Payer: 59

## 2015-02-26 ENCOUNTER — Ambulatory Visit (INDEPENDENT_AMBULATORY_CARE_PROVIDER_SITE_OTHER): Payer: 59 | Admitting: Gynecology

## 2015-02-26 ENCOUNTER — Encounter: Payer: Self-pay | Admitting: Gynecology

## 2015-02-26 ENCOUNTER — Other Ambulatory Visit (HOSPITAL_COMMUNITY)
Admission: RE | Admit: 2015-02-26 | Discharge: 2015-02-26 | Disposition: A | Discharge status: Home / Self Care | Payer: 59 | Source: Ambulatory Visit | Attending: Gynecology | Admitting: Gynecology

## 2015-02-26 VITALS — BP 124/84 | Ht 65.0 in | Wt 160.0 lb

## 2015-02-26 DIAGNOSIS — N952 Postmenopausal atrophic vaginitis: Secondary | ICD-10-CM

## 2015-02-26 DIAGNOSIS — Z01419 Encounter for gynecological examination (general) (routine) without abnormal findings: Secondary | ICD-10-CM | POA: Diagnosis not present

## 2015-02-26 DIAGNOSIS — M810 Age-related osteoporosis without current pathological fracture: Secondary | ICD-10-CM | POA: Diagnosis not present

## 2015-02-26 NOTE — Patient Instructions (Signed)
You may obtain a copy of any labs that were done today by logging onto MyChart as outlined in the instructions provided with your AVS (after visit summary). The office will not call with normal lab results but certainly if there are any significant abnormalities then we will contact you.   Health Maintenance, Female A healthy lifestyle and preventative care can promote health and wellness.  Maintain regular health, dental, and eye exams.  Eat a healthy diet. Foods like vegetables, fruits, whole grains, low-fat dairy products, and lean protein foods contain the nutrients you need without too many calories. Decrease your intake of foods high in solid fats, added sugars, and salt. Get information about a proper diet from your caregiver, if necessary.  Regular physical exercise is one of the most important things you can do for your health. Most adults should get at least 150 minutes of moderate-intensity exercise (any activity that increases your heart rate and causes you to sweat) each week. In addition, most adults need muscle-strengthening exercises on 2 or more days a week.   Maintain a healthy weight. The body mass index (BMI) is a screening tool to identify possible weight problems. It provides an estimate of body fat based on height and weight. Your caregiver can help determine your BMI, and can help you achieve or maintain a healthy weight. For adults 20 years and older:  A BMI below 18.5 is considered underweight.  A BMI of 18.5 to 24.9 is normal.  A BMI of 25 to 29.9 is considered overweight.  A BMI of 30 and above is considered obese.  Maintain normal blood lipids and cholesterol by exercising and minimizing your intake of saturated fat. Eat a balanced diet with plenty of fruits and vegetables. Blood tests for lipids and cholesterol should begin at age 61 and be repeated every 5 years. If your lipid or cholesterol levels are high, you are over 50, or you are a high risk for heart  disease, you may need your cholesterol levels checked more frequently.Ongoing high lipid and cholesterol levels should be treated with medicines if diet and exercise are not effective.  If you smoke, find out from your caregiver how to quit. If you do not use tobacco, do not start.  Lung cancer screening is recommended for adults aged 33 80 years who are at high risk for developing lung cancer because of a history of smoking. Yearly low-dose computed tomography (CT) is recommended for people who have at least a 30-pack-year history of smoking and are a current smoker or have quit within the past 15 years. A pack year of smoking is smoking an average of 1 pack of cigarettes a day for 1 year (for example: 1 pack a day for 30 years or 2 packs a day for 15 years). Yearly screening should continue until the smoker has stopped smoking for at least 15 years. Yearly screening should also be stopped for people who develop a health problem that would prevent them from having lung cancer treatment.  If you are pregnant, do not drink alcohol. If you are breastfeeding, be very cautious about drinking alcohol. If you are not pregnant and choose to drink alcohol, do not exceed 1 drink per day. One drink is considered to be 12 ounces (355 mL) of beer, 5 ounces (148 mL) of wine, or 1.5 ounces (44 mL) of liquor.  Avoid use of street drugs. Do not share needles with anyone. Ask for help if you need support or instructions about stopping  the use of drugs.  High blood pressure causes heart disease and increases the risk of stroke. Blood pressure should be checked at least every 1 to 2 years. Ongoing high blood pressure should be treated with medicines, if weight loss and exercise are not effective.  If you are 59 to 64 years old, ask your caregiver if you should take aspirin to prevent strokes.  Diabetes screening involves taking a blood sample to check your fasting blood sugar level. This should be done once every 3  years, after age 91, if you are within normal weight and without risk factors for diabetes. Testing should be considered at a younger age or be carried out more frequently if you are overweight and have at least 1 risk factor for diabetes.  Breast cancer screening is essential preventative care for women. You should practice "breast self-awareness." This means understanding the normal appearance and feel of your breasts and may include breast self-examination. Any changes detected, no matter how small, should be reported to a caregiver. Women in their 66s and 30s should have a clinical breast exam (CBE) by a caregiver as part of a regular health exam every 1 to 3 years. After age 101, women should have a CBE every year. Starting at age 100, women should consider having a mammogram (breast X-ray) every year. Women who have a family history of breast cancer should talk to their caregiver about genetic screening. Women at a high risk of breast cancer should talk to their caregiver about having an MRI and a mammogram every year.  Breast cancer gene (BRCA)-related cancer risk assessment is recommended for women who have family members with BRCA-related cancers. BRCA-related cancers include breast, ovarian, tubal, and peritoneal cancers. Having family members with these cancers may be associated with an increased risk for harmful changes (mutations) in the breast cancer genes BRCA1 and BRCA2. Results of the assessment will determine the need for genetic counseling and BRCA1 and BRCA2 testing.  The Pap test is a screening test for cervical cancer. Women should have a Pap test starting at age 57. Between ages 25 and 35, Pap tests should be repeated every 2 years. Beginning at age 37, you should have a Pap test every 3 years as long as the past 3 Pap tests have been normal. If you had a hysterectomy for a problem that was not cancer or a condition that could lead to cancer, then you no longer need Pap tests. If you are  between ages 50 and 76, and you have had normal Pap tests going back 10 years, you no longer need Pap tests. If you have had past treatment for cervical cancer or a condition that could lead to cancer, you need Pap tests and screening for cancer for at least 20 years after your treatment. If Pap tests have been discontinued, risk factors (such as a new sexual partner) need to be reassessed to determine if screening should be resumed. Some women have medical problems that increase the chance of getting cervical cancer. In these cases, your caregiver may recommend more frequent screening and Pap tests.  The human papillomavirus (HPV) test is an additional test that may be used for cervical cancer screening. The HPV test looks for the virus that can cause the cell changes on the cervix. The cells collected during the Pap test can be tested for HPV. The HPV test could be used to screen women aged 44 years and older, and should be used in women of any age  who have unclear Pap test results. After the age of 55, women should have HPV testing at the same frequency as a Pap test.  Colorectal cancer can be detected and often prevented. Most routine colorectal cancer screening begins at the age of 44 and continues through age 20. However, your caregiver may recommend screening at an earlier age if you have risk factors for colon cancer. On a yearly basis, your caregiver may provide home test kits to check for hidden blood in the stool. Use of a small camera at the end of a tube, to directly examine the colon (sigmoidoscopy or colonoscopy), can detect the earliest forms of colorectal cancer. Talk to your caregiver about this at age 86, when routine screening begins. Direct examination of the colon should be repeated every 5 to 10 years through age 13, unless early forms of pre-cancerous polyps or small growths are found.  Hepatitis C blood testing is recommended for all people born from [birth year] through 1965 and any  individual with known risks for hepatitis C.  Practice safe sex. Use condoms and avoid high-risk sexual practices to reduce the spread of sexually transmitted infections (STIs). Sexually active women aged 36 and younger should be checked for Chlamydia, which is a common sexually transmitted infection. Older women with new or multiple partners should also be tested for Chlamydia. Testing for other STIs is recommended if you are sexually active and at increased risk.  Osteoporosis is a disease in which the bones lose minerals and strength with aging. This can result in serious bone fractures. The risk of osteoporosis can be identified using a bone density scan. Women ages 20 and over and women at risk for fractures or osteoporosis should discuss screening with their caregivers. Ask your caregiver whether you should be taking a calcium supplement or vitamin D to reduce the rate of osteoporosis.  Menopause can be associated with physical symptoms and risks. Hormone replacement therapy is available to decrease symptoms and risks. You should talk to your caregiver about whether hormone replacement therapy is right for you.  Use sunscreen. Apply sunscreen liberally and repeatedly throughout the day. You should seek shade when your shadow is shorter than you. Protect yourself by wearing long sleeves, pants, a wide-brimmed hat, and sunglasses year round, whenever you are outdoors.  Notify your caregiver of new moles or changes in moles, especially if there is a change in shape or color. Also notify your caregiver if a mole is larger than the size of a pencil eraser.  Stay current with your immunizations. Document Released: 05/30/2011 Document Revised: 03/11/2013 Document Reviewed: 05/30/2011 [facility] Patient Information 2014 [employer].

## 2015-02-26 NOTE — Progress Notes (Signed)
[name] 1950/12/24 [id number]        64 y.o.  G2P2 for annual exam.  Former patient Dr. Cherylann Banas. Has not been in the office since 2012. Several issues noted below.  Past medical history,surgical history, problem list, medications, allergies, family history and social history were all reviewed and documented as reviewed in the EPIC chart.  ROS:  Performed with pertinent positives and negatives included in the history, assessment and plan.   Additional significant findings :  none   Exam: Kim Counsellor Vitals:   02/26/15 1432  BP: 124/84  Height: 5\' 5"  (1.651 m)  Weight: 160 lb (72.576 kg)   General appearance:  Normal affect, orientation and appearance. Skin: Grossly normal HEENT: Without gross lesions.  No cervical or supraclavicular adenopathy. Thyroid normal.  Lungs:  Clear without wheezing, rales or rhonchi Cardiac: RR, without RMG Abdominal:  Soft, nontender, without masses, guarding, rebound, organomegaly or hernia Breasts:  Examined lying and sitting. Left without masses, retractions, discharge or axillary adenopathy.  Right status post mastectomy with reconstruction and implant. No acute changes. No masses or axillary adenopathy. Pelvic:  Ext/BUS/vagina with atrophic changes  Cervix with atrophic changes. Pap smear done  Uterus anteverted, normal size, shape and contour, midline and mobile nontender   Adnexa  Without masses or tenderness    Anus and perineum  Normal   Rectovaginal  Normal sphincter tone without palpated masses or tenderness.    Assessment/Plan:  64 y.o. G2P2 female for annual exam.   1. Postmenopausal/atrophic genital changes. Without significant hot flashes, night sweats or vaginal dryness. Not sexually active. No vaginal bleeding. Continue to monitor and report any vaginal bleeding. 2. Osteoporosis. DEXA 2014 T score -2.5 improved from prior DEXA. History of prior Fosamax/Boniva use proximally 2003 through 2008.  Currently on Zometa  through oncology. Due for repeat bone density now she is in the process of scheduling. Increased calcium vitamin D reviewed. 3. 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 Exam NED. Mammography 06/2014. Continue with annual mammography and follow up with her oncologist. Currently on Arimidex. SBE monthly reviewed. 4. Pap smear 2012. Pap smear done today. No history of significant abnormal Pap smears previously. 5. Colonoscopy 2014. Repeat at their recommended interval. 6. Health maintenance. No routine blood work done as patient reports this done at her primary physician's office. Follow up in one year, sooner as needed.     [name] MD, 3:06 PM 02/26/2015

## 2015-02-26 NOTE — Addendum Note (Signed)
Addended by: Nelva Nay on: 02/26/2015 03:20 PM   Modules accepted: Orders

## 2015-02-27 LAB — URINALYSIS W MICROSCOPIC + REFLEX CULTURE
BACTERIA UA: NONE SEEN
Bilirubin Urine: NEGATIVE
CASTS: NONE SEEN
CRYSTALS: NONE SEEN
Glucose, UA: NEGATIVE mg/dL
Hgb urine dipstick: NEGATIVE
Ketones, ur: NEGATIVE mg/dL
LEUKOCYTES UA: NEGATIVE
Nitrite: NEGATIVE
PROTEIN: NEGATIVE mg/dL
Specific Gravity, Urine: <1.005 — ABNORMAL LOW (ref 1.005–1.030)
Squamous Epithelial / LPF: NONE SEEN
Urobilinogen, UA: 0.2 mg/dL (ref 0.0–1.0)
pH: 7 (ref 5.0–8.0)

## 2015-03-02 LAB — CYTOLOGY - PAP

## 2015-03-04 ENCOUNTER — Encounter: Payer: Self-pay | Admitting: *Deleted

## 2015-03-19 ENCOUNTER — Other Ambulatory Visit: Payer: Self-pay | Admitting: Nurse Practitioner

## 2015-04-29 ENCOUNTER — Other Ambulatory Visit: Payer: Self-pay | Admitting: Family Medicine

## 2015-04-29 ENCOUNTER — Other Ambulatory Visit: Payer: Self-pay | Admitting: Oncology

## 2015-04-29 ENCOUNTER — Telehealth: Payer: Self-pay | Admitting: Family Medicine

## 2015-04-29 NOTE — Telephone Encounter (Signed)
Last OV 07/30/14.  Next OV 08/06/15.  Chart reviewed.

## 2015-04-29 NOTE — Telephone Encounter (Signed)
Caller name: Angelly Spearing Relationship to patient: self Can be reached: [phone] [facility]: [facility] in [place]  Reason for call: Pt needing refills on: 1. anastrozole (ARIMIDEX) 1 MG tablet 2. escitalopram (LEXAPRO) 10 MG tablet

## 2015-04-30 MED ORDER — ESCITALOPRAM OXALATE 10 MG PO TABS: 10.0000 mg | ORAL_TABLET | Freq: Every day | ORAL | Status: DC

## 2015-04-30 NOTE — Telephone Encounter (Signed)
Called the patient informed lexapro refilled but needs appt. For further refills.  Also left msg. To inform Arimidex not prescribed by PCP and would need to call prescribing physician for that medication (Dr. Jana Hakim)

## 2015-08-05 ENCOUNTER — Other Ambulatory Visit: Payer: Self-pay | Admitting: *Deleted

## 2015-08-05 DIAGNOSIS — C50911 Malignant neoplasm of unspecified site of right female breast: Secondary | ICD-10-CM

## 2015-08-06 ENCOUNTER — Other Ambulatory Visit (HOSPITAL_BASED_OUTPATIENT_CLINIC_OR_DEPARTMENT_OTHER): Payer: 59

## 2015-08-06 ENCOUNTER — Ambulatory Visit (HOSPITAL_BASED_OUTPATIENT_CLINIC_OR_DEPARTMENT_OTHER): Payer: 59 | Admitting: Oncology

## 2015-08-06 ENCOUNTER — Ambulatory Visit: Payer: 59 | Admitting: Oncology

## 2015-08-06 ENCOUNTER — Ambulatory Visit (HOSPITAL_BASED_OUTPATIENT_CLINIC_OR_DEPARTMENT_OTHER): Payer: 59

## 2015-08-06 ENCOUNTER — Other Ambulatory Visit: Payer: 59

## 2015-08-06 ENCOUNTER — Telehealth: Payer: Self-pay | Admitting: Oncology

## 2015-08-06 VITALS — BP 122/59 | HR 62 | Temp 98.2°F | Resp 18 | Ht 65.0 in | Wt 160.6 lb

## 2015-08-06 DIAGNOSIS — Z79811 Long term (current) use of aromatase inhibitors: Secondary | ICD-10-CM | POA: Diagnosis not present

## 2015-08-06 DIAGNOSIS — M810 Age-related osteoporosis without current pathological fracture: Secondary | ICD-10-CM

## 2015-08-06 DIAGNOSIS — C50911 Malignant neoplasm of unspecified site of right female breast: Secondary | ICD-10-CM

## 2015-08-06 DIAGNOSIS — C50511 Malignant neoplasm of lower-outer quadrant of right female breast: Secondary | ICD-10-CM | POA: Diagnosis not present

## 2015-08-06 DIAGNOSIS — C50919 Malignant neoplasm of unspecified site of unspecified female breast: Secondary | ICD-10-CM

## 2015-08-06 DIAGNOSIS — M25552 Pain in left hip: Secondary | ICD-10-CM

## 2015-08-06 LAB — CBC WITH DIFFERENTIAL/PLATELET
BASO%: 0.8 % (ref 0.0–2.0)
Basophils Absolute: 0.1 10*3/uL (ref 0.0–0.1)
EOS ABS: 0.1 10*3/uL (ref 0.0–0.5)
EOS%: 1.6 % (ref 0.0–7.0)
HCT: 41.7 % (ref 34.8–46.6)
HGB: 13.6 g/dL (ref 11.6–15.9)
LYMPH%: 24.9 % (ref 14.0–49.7)
MCH: 29.5 pg (ref 25.1–34.0)
MCHC: 32.5 g/dL (ref 31.5–36.0)
MCV: 90.5 fL (ref 79.5–101.0)
MONO#: 0.5 10*3/uL (ref 0.1–0.9)
MONO%: 5.8 % (ref 0.0–14.0)
NEUT#: 5.2 10*3/uL (ref 1.5–6.5)
NEUT%: 66.9 % (ref 38.4–76.8)
Platelets: 298 10*3/uL (ref 145–400)
RBC: 4.61 10*6/uL (ref 3.70–5.45)
RDW: 13.9 % (ref 11.2–14.5)
WBC: 7.8 10*3/uL (ref 3.9–10.3)
lymph#: 1.9 10*3/uL (ref 0.9–3.3)

## 2015-08-06 LAB — COMPREHENSIVE METABOLIC PANEL (CC13)
ALT: 19 U/L (ref 0–55)
AST: 17 U/L (ref 5–34)
Albumin: 3.9 g/dL (ref 3.5–5.0)
Alkaline Phosphatase: 72 U/L (ref 40–150)
Anion Gap: 9 mEq/L (ref 3–11)
BUN: 16.7 mg/dL (ref 7.0–26.0)
CALCIUM: 9.1 mg/dL (ref 8.4–10.4)
CHLORIDE: 106 meq/L (ref 98–109)
CO2: 26 mEq/L (ref 22–29)
CREATININE: 0.7 mg/dL (ref 0.6–1.1)
EGFR: >90 mL/min/{1.73_m2} (ref >90)
Glucose: 84 mg/dl (ref 70–140)
Potassium: 3.6 mEq/L (ref 3.5–5.1)
SODIUM: 141 meq/L (ref 136–145)
Total Bilirubin: 0.36 mg/dL (ref 0.20–1.20)
Total Protein: 7.3 g/dL (ref 6.4–8.3)

## 2015-08-06 MED ORDER — ZOLEDRONIC ACID 4 MG/100ML IV SOLN
4.0000 mg | Freq: Once | INTRAVENOUS | Status: AC
  Administered 2015-08-06: 4 mg via INTRAVENOUS
  Filled 2015-08-06: qty 100

## 2015-08-06 NOTE — Progress Notes (Signed)
ID: [name]   DOB: [birth year]  MR#: [id number]  CSN#:[id number]  PCP: Penni Homans, MD GYN:  SU:  OTHER MD: Marolyn Hammock, DDS  CHIEF COMPLAINT: right breast cancer CURRENT TREATMENT: anastrozole 27m daily  HISTORY OF PRESENT ILLNESS: From the earlier summary note:  The patient formally saw Dr. RTruddie Cocofor a remote history of right breast cancer status post lumpectomy and axillary lymph node dissection in August 1998.  This was a T1c N0, estrogen and progesterone receptor positive tumor, which was treated adjuvantly with doxorubicin and cyclophosphamide x4 followed by adjuvant radiation completed in March 1998 followed by 5 years of tamoxifen completed in January 2004.  More [facility] found a possible abnormality in the right breast.  The patient was brought back for additional views the next day and additional spot compression views were obtained, with the report not available in our system.  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   With this information, the patient was referred for bilateral breast MRIs, May 15, 2011.  In addition to the prior lumpectomy changes, there was an enhancing irregular mass in the lower-outer quadrant of the right breast measuring 1.6 cm.  The rest of the MRI exam was unremarkable except for a mildly prominent right axillary lymph node.  With this information, the patient was presented at the Multidisciplinary [facility] and the recommendation was made for mastectomy with an Oncotype to be sent  at the same time.   The patient did undergo right mastectomy, Dr. TMarlou Starks with sentinel lymph node biopsy, and immediate reconstruction with a saline implant and right latissimus myocutaneous flap under DCrissie Reese August 10th.  The pathology from this procedure (([id number] confirmed a well-differentiated invasive ductal carcinoma measuring 1.5 cm with ample margins and 1/6 lymph nodes involved.  HER2 was repeated and was again negative.   With this information, the patient was seen by Dr. RTruddie Cocoand started on anastrozole as well as XDelton Seegiven her history of osteoporosis.  She has also been seen by Genetics and had an Oncotype DX as noted below. She established herself in my service October 2012.  INTERVAL HISTORY: Telena returns today for follow up of her right breast cancer. She continues on letrozole with excellent tolerance. Hot flashes are minimal. Vaginal dryness and arthralgias and myalgias are not an issue. She pays approximately $20 a month for this medication which is a little bit on the high side, although not impossible.  REVIEW OF SYSTEMS: Laverle has some joint pain here and there but is not more persistent or intense than usual. [place]. Rani is working about 50 hours a week, but really enjoys her job. A detailed review of systems today was otherwise stable  PAST MEDICAL HISTORY: Past Medical History  Diagnosis Date  . Chicken pox   . Left hip pain 05/04/2011  . Colon polyps   . Dyslipidemia   . Tobacco abuse 05/05/2011  . Depression with anxiety 09/07/2012  . Elevated BP 09/09/2012  . Preventative health care 09/09/2012  .  H/O tobacco use, presenting hazards to health 05/05/2011  . Cancer 7026,3785    Breast  . Osteoporosis 2014    T score - 2.5  Significant for appendectomy remotely, history of osteopenia/osteoporosis, history of dyslipidemia, and history of 15 pack-year tobacco abuse.   PAST SURGICAL  HISTORY: Past Surgical History  Procedure Laterality Date  . Appendectomy  2010  . Breast lumpectomy  1998    right  . Breast surgery  07/08/11    right mastectomy+axlnd, reconstruction,T1cN1a,ERPR+,HER2-  . Breast surgery  march,2013    breast implant deflated    FAMILY HISTORY Family History  Problem Relation Age of Onset  . Diabetes Mother     type 2  . Obesity Mother   . Cancer Father     ?  . Colon cancer Neg Hx   . Esophageal cancer Neg Hx   . Rectal cancer Neg Hx   . Stomach cancer Neg Hx   . Heart block Sister   This is essentially noncontributory.  The patient's father did die from cancer but they do not know the primary.  It seems to have been most likely a lung cancer that was widely metastatic at diagnosis.  He was 64 years old at that time.  Otherwise, there is no history of breast or ovarian cancer in the patient's family  GYNECOLOGIC HISTORY: She had menarche, age 57.  Menopause at age 32.  She is GX, P2.  First pregnancy to term, age 89.  She never took hormone replacement.  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 Marland Kitchen  Her husband Reynolds Bowl") Arseneault has a history of prostate cancer, was in Architect but is now retired.  They have 2 children, a son and a daughter, both living in this area and 2 grandchildren.  The patient is not a church attender.   ADVANCED DIRECTIVES:  HEALTH MAINTENANCE: (Updated 08/19/2013) Social History  Substance Use Topics  . Smoking status: Former Research scientist (life sciences)  . Smokeless tobacco: Never Used  . Alcohol Use: 0.0 oz/week    0 Standard drinks or equivalent per week     Comment: occasionally  As noted, the patient had a 15 pack-year tobacco abuse history but has quit.  There is no history of alcohol abuse.     Colonoscopy:  05/17/2013, Dr. Fuller Plan  PAP: June 2012, Dr. Leonel Ramsay  Bone density: 02/19/2013, [facility], osteoporosis with a T score of  -2.5  Lipid panel: UTD, Dr. Randel Pigg  No Known Allergies  Current Outpatient Prescriptions  Medication Sig Dispense Refill  . acyclovir (ZOVIRAX) 400 MG tablet Take 1 tablet (400 mg total) by mouth 3 (three) times daily. X 5 days 15 tablet 2  . anastrozole (ARIMIDEX) 1 MG tablet TAKE ONE TABLET BY MOUTH ONCE DAILY 30 tablet 4  . calcium-vitamin D (OSCAL WITH D) 500-200 MG-UNIT per tablet Take 1 tablet by mouth 2 (two) times daily.    Marland Kitchen escitalopram (LEXAPRO) 10 MG tablet Take 1 tablet (10 mg total) by mouth daily. 30 tablet 2  . gabapentin (NEURONTIN) 300 MG capsule TAKE ONE CAPSULE BY MOUTH AT BEDTIME 30 capsule 4  . ibuprofen (ADVIL,MOTRIN) 400 MG tablet Take 400 mg by mouth every 6 (six) hours as needed for pain.    Marland Kitchen LORazepam (ATIVAN) 0.5 MG tablet Take 1 tablet (0.5 mg total) by mouth 2 (two) times daily as needed for anxiety. 30 tablet 2   No current  facility-administered medications for this visit.    OBJECTIVE: Middle-aged white woman in no acute distress Filed Vitals:   08/06/15 1455  BP: 122/59  Pulse: 62  Temp: 98.2 F (36.8 C)  Resp: 18     Body mass index is 26.73 kg/(m^2).    ECOG FS: 0 Filed Weights   08/06/15 1455  Weight: 160 lb 9.6 oz (72.848 kg)   Sclerae unicteric, pupils round and equal Oropharynx clear and moist-- no thrush or other lesions No cervical or supraclavicular adenopathy Lungs no rales or rhonchi Heart regular rate and rhythm Abd soft, nontender, positive bowel sounds MSK no focal spinal tenderness, no upper extremity lymphedema Neuro: nonfocal, well oriented, appropriate affect Breasts: The right breast is status post mastectomy with reconstruction. There is no evidence of local recurrence. The right axilla is benign. The left breast is unremarkable.     LAB RESULTS: Lab Results  Component Value Date   WBC 7.8 08/06/2015   NEUTROABS 5.2 08/06/2015   HGB 13.6 08/06/2015   HCT 41.7 08/06/2015   MCV 90.5 08/06/2015   PLT 298 08/06/2015       Chemistry      Component Value Date/Time   NA 138 10/09/2014 1449   NA 141 07/23/2014 1547   K 3.7 10/09/2014 1449   K 3.3* 07/23/2014 1547   CL 102 10/09/2014 1449   CL 103 02/12/2013 1234   CO2 25 10/09/2014 1449   CO2 27 07/23/2014 1547   BUN 11 10/09/2014 1449   BUN 15.5 07/23/2014 1547   CREATININE 0.6 10/09/2014 1449   CREATININE 0.8 07/23/2014 1547   CREATININE 0.55 11/01/2013 1537      Component Value Date/Time   CALCIUM 8.8 10/09/2014 1449   CALCIUM 9.1 07/23/2014 1547   ALKPHOS 65 07/23/2014 1547   ALKPHOS 62 08/29/2013 1351   AST 16 07/23/2014 1547   AST 15 08/29/2013 1351   ALT 14 07/23/2014 1547   ALT 14 08/29/2013 1351   BILITOT 0.32 07/23/2014 1547   BILITOT 0.3 08/29/2013 1351     STUDIES: Repeat mammography and bone density pending this month  ASSESSMENT: 64 y.o. [facility] woman status post:  (1) Right lumpectomy and axillary lymph node dissection  1998, for a T1c N0, estrogen receptor positive breast cancer, treated adjuvantly with doxorubicin and cyclophosphamide times 4, followed by radiation then tamoxifen for 5 years   (2) Right mastectomy with immediate implant and latissimus flap reconstruction, August 2012, for a T1c N1, grade 1, invasive ductal carcinoma, estrogen and progesterone receptor positive, HER2 negative, with an MIB1 of 13%.  (3) her Oncotype DX  recurrence score was 14 predicting for a 9% risk of distant recurrence within 10 years if she took 5 years of tamoxifen.    (4) on anastrozole as of November 2012  (5) with a history of osteoporosis, on denosumab initially, switched to zolendronic acid March 2013 because of possible further reduction in breast cancer recurrence, to continue every 6 months for 2 years, then yearly beginning September 2014, and continuing while on anastrozole.     PLAN:  Ilynn is 4 years out from her definitive surgery for breast cancer, with no evidence of disease recurrence. This is very  favorable.  She is tolerating the anastrozole well. She is continuing the zolendronate on a yearly basis and will receive a dose today. She has had no complications from that treatment.  She is going to see me one more time, October of next year. She will have  a repeat mammogram before that visit and she will have a bone density later this year. At the next year visit she will have her final zolendronate dose and likely she will be released from follow-up at that time  Today we did discuss the possibility of continuing aromatase inhibitors for a total of 10 years. The benefits however are small. There is a 2% decrease in local recurrence or new breast cancer developing, and a 1% decrease in metastatic spread. At this point Perline is not interested in the extra 5 years of letrozole for those very marginal benefits  She knows to call for any problems that may develop before next visit here. Korynn Kenedy C    08/06/2015

## 2015-08-06 NOTE — Telephone Encounter (Signed)
Gave avs & calendar for October 2017

## 2015-08-06 NOTE — Patient Instructions (Signed)

## 2015-08-17 LAB — HM DEXA SCAN

## 2015-08-27 ENCOUNTER — Encounter: Payer: Self-pay | Admitting: Family Medicine

## 2015-09-08 ENCOUNTER — Ambulatory Visit (INDEPENDENT_AMBULATORY_CARE_PROVIDER_SITE_OTHER): Payer: 59 | Admitting: Family Medicine

## 2015-09-08 ENCOUNTER — Telehealth: Payer: Self-pay | Admitting: Family Medicine

## 2015-09-08 ENCOUNTER — Encounter: Payer: Self-pay | Admitting: Family Medicine

## 2015-09-08 VITALS — BP 114/78 | HR 74 | Temp 97.9°F | Ht 65.0 in | Wt 162.1 lb

## 2015-09-08 DIAGNOSIS — E785 Hyperlipidemia, unspecified: Secondary | ICD-10-CM

## 2015-09-08 DIAGNOSIS — F418 Other specified anxiety disorders: Secondary | ICD-10-CM

## 2015-09-08 DIAGNOSIS — M810 Age-related osteoporosis without current pathological fracture: Secondary | ICD-10-CM

## 2015-09-08 DIAGNOSIS — R030 Elevated blood-pressure reading, without diagnosis of hypertension: Secondary | ICD-10-CM | POA: Diagnosis not present

## 2015-09-08 DIAGNOSIS — IMO0001 Reserved for inherently not codable concepts without codable children: Secondary | ICD-10-CM

## 2015-09-08 DIAGNOSIS — F419 Anxiety disorder, unspecified: Principal | ICD-10-CM

## 2015-09-08 DIAGNOSIS — F329 Major depressive disorder, single episode, unspecified: Secondary | ICD-10-CM

## 2015-09-08 DIAGNOSIS — F32A Depression, unspecified: Secondary | ICD-10-CM

## 2015-09-08 MED ORDER — ESCITALOPRAM OXALATE 10 MG PO TABS: 10.0000 mg | ORAL_TABLET | Freq: Every day | ORAL | Status: DC

## 2015-09-08 MED ORDER — LORAZEPAM 0.5 MG PO TABS: 0.5000 mg | ORAL_TABLET | Freq: Two times a day (BID) | ORAL | Status: DC | PRN

## 2015-09-08 NOTE — Patient Instructions (Signed)
Vitamin D 2000 IU daily Calcium between 1200 and 1500 mg daily, mix of foods and supplements. So roughly 3 servings daily if you need a supplement look for Calcium citrate (citracal)  Consider Prolia injections once every 6 months.  Osteoporosis Osteoporosis is the thinning and loss of density in the bones. Osteoporosis makes the bones more brittle, fragile, and likely to break (fracture). Over time, osteoporosis can cause the bones to become so weak that they fracture after a simple fall. The bones most likely to fracture are the bones in the hip, wrist, and spine. CAUSES  The exact cause is not known. RISK FACTORS Anyone can develop osteoporosis. You may be at greater risk if you have a family history of the condition or have poor nutrition. You may also have a higher risk if you are:   Female.   8 years old or older.  A smoker.  Not physically active.   White or Asian.  Slender. SIGNS AND SYMPTOMS  A fracture might be the first sign of the disease, especially if it results from a fall or injury that would not usually cause a bone to break. Other signs and symptoms include:   Low back and neck pain.  Stooped posture.  Height loss. DIAGNOSIS  To make a diagnosis, your health care provider may:  Take a medical history.  Perform a physical exam.  Order tests, such as:  A bone mineral density test.  A dual-energy X-ray absorptiometry test. TREATMENT  The goal of osteoporosis treatment is to strengthen your bones to reduce your risk of a fracture. Treatment may involve:  Making lifestyle changes, such as:  Eating a diet rich in calcium.  Doing weight-bearing and muscle-strengthening exercises.  Stopping tobacco use.  Limiting alcohol intake.  Taking medicine to slow the process of bone loss or to increase bone density.  Monitoring your levels of calcium and vitamin D. HOME CARE INSTRUCTIONS  Include calcium and vitamin D in your diet. Calcium is important  for bone health, and vitamin D helps the body absorb calcium.  Perform weight-bearing and muscle-strengthening exercises as directed by your health care provider.  Do not use any tobacco products, including cigarettes, chewing tobacco, and electronic cigarettes. If you need help quitting, ask your health care provider.  Limit your alcohol intake.  Take medicines only as directed by your health care provider.  Keep all follow-up visits as directed by your health care provider. This is important.  Take precautions at home to lower your risk of falling, such as:  Keeping rooms well lit and clutter free.  Installing safety rails on stairs.  Using rubber mats in the bathroom and other areas that are often wet or slippery. SEEK IMMEDIATE MEDICAL CARE IF:  You fall or injure yourself.    This information is not intended to replace advice given to you by your health care provider. Make sure you discuss any questions you have with your health care provider.   Document Released: 08/24/2005 Document Revised: 12/05/2014 Document Reviewed: 04/24/2014 Elsevier Interactive Patient Education [employer].

## 2015-09-08 NOTE — Telephone Encounter (Signed)
Called the patient informed of Bone Density Results. Stable Osteoporosis Take OTC Vitamin D 2000 IU's daily Calcium 500 mg tid and increase exercise. PCP would like the patient to consider medications or come in to discuss. The patients last appointment was 11/15 and was instructed to return in 6 months on 5/16. The patient did not return as instructed, but I did inform her she did need appointment asap to discuss medications/current refills as well. The patient agree and transferred to a scheduler and patient was scheduled today 09/08/15 at 5:45 PM.

## 2015-09-08 NOTE — Progress Notes (Signed)
Pre visit review using our clinic review tool, if applicable. No additional management support is needed unless otherwise documented below in the visit note. 

## 2015-09-20 NOTE — Assessment & Plan Note (Signed)
Vitamin D dao;u. Calcium 1500 mg daily divided tid, encouraged to consider treatment with Prolia.  Maintain exercise

## 2015-09-20 NOTE — Assessment & Plan Note (Signed)
Well controlled, encouraged heart healthy diet such as the DASH diet and exercise as tolerated.

## 2015-09-20 NOTE — Assessment & Plan Note (Signed)
Doing well. Given refill on Lorazepam

## 2015-09-20 NOTE — Assessment & Plan Note (Signed)
Encouraged heart healthy diet, increase exercise, avoid trans fats, consider a krill oil cap daily 

## 2015-09-20 NOTE — Progress Notes (Signed)
[name] [id number] 15-Jul-1951 09/20/2015      Progress Note New Patient  Subjective  Chief Complaint  Chief Complaint  Patient presents with  . Follow-up    6 month    HPI  Patient is a 64 year old Caucasian female in today for follow-up. Is interested in discussing her options for osteoporosis. Is trying to stay active. No recent illness. No acute complaints. No hospitalizations. Denies anhedonia or suicidal ideation but does occasionally have some episodes of anxiety requiring lorazepam. Works well. Denies CP/palp/SOB/HA/congestion/fevers/GI or GU c/o. Taking meds as prescribed  Past Medical History  Diagnosis Date  . Chicken pox   . Left hip pain 05/04/2011  . Colon polyps   . Dyslipidemia   . Tobacco abuse 05/05/2011  . Depression with anxiety 09/07/2012  . Elevated BP 09/09/2012  . Preventative health care 09/09/2012  . H/O tobacco use, presenting hazards to health 05/05/2011  . Cancer [facility]) [id number]    Breast  . Osteoporosis 2014    T score - 2.5    Past Surgical History  Procedure Laterality Date  . Appendectomy  2010  . Breast lumpectomy  1998    right  . Breast surgery  07/08/11    right mastectomy+axlnd, reconstruction,T1cN1a,ERPR+,HER2-  . Breast surgery  march,2013    breast implant deflated    Family History  Problem Relation Age of Onset  . Diabetes Mother     type 2  . Obesity Mother   . Cancer Father     ?  . Colon cancer Neg Hx   . Esophageal cancer Neg Hx   . Rectal cancer Neg Hx   . Stomach cancer Neg Hx   . Heart block Sister     Social History   Social History  . Marital Status: Married    Spouse Name: N/A  . Number of Children: N/A  . Years of Education: N/A   Occupational History  . Not on file.   Social History Main Topics  . Smoking status: Former Research scientist (life sciences)  . Smokeless tobacco: Never Used  . Alcohol Use: 0.0 oz/week    0 Standard drinks or equivalent per week     Comment: occasionally  . Drug Use: No  . Sexual  Activity: Not Currently     Comment: 1st intercourse 18 yo-5 partners   Other Topics Concern  . Not on file   Social History Narrative    Current Outpatient Prescriptions on File Prior to Visit  Medication Sig Dispense Refill  . acyclovir (ZOVIRAX) 400 MG tablet Take 1 tablet (400 mg total) by mouth 3 (three) times daily. X 5 days 15 tablet 2  . anastrozole (ARIMIDEX) 1 MG tablet TAKE ONE TABLET BY MOUTH ONCE DAILY 30 tablet 4  . calcium-vitamin D (OSCAL WITH D) 500-200 MG-UNIT per tablet Take 1 tablet by mouth 2 (two) times daily.    Marland Kitchen gabapentin (NEURONTIN) 300 MG capsule TAKE ONE CAPSULE BY MOUTH AT BEDTIME 30 capsule 4  . ibuprofen (ADVIL,MOTRIN) 400 MG tablet Take 400 mg by mouth every 6 (six) hours as needed for pain.     No current facility-administered medications on file prior to visit.    No Known Allergies  Review of Systems  Review of Systems  Constitutional: Positive for malaise/fatigue. Negative for fever.  HENT: Negative for congestion.   Eyes: Negative for discharge.  Respiratory: Negative for shortness of breath.   Cardiovascular: Negative for chest pain, palpitations and leg swelling.  Gastrointestinal: Negative for nausea and abdominal  pain.  Genitourinary: Negative for dysuria.  Musculoskeletal: Negative for falls.  Skin: Negative for rash.  Neurological: Negative for loss of consciousness and headaches.  Endo/Heme/Allergies: Negative for environmental allergies.  Psychiatric/Behavioral: Negative for depression. The patient is nervous/anxious.     Objective  BP 114/78 mmHg  Pulse 74  Temp(Src) 97.9 F (36.6 C) (Oral)  Ht 5' 5"  (1.651 m)  Wt 162 lb 2 oz (73.539 kg)  BMI 26.98 kg/m2  SpO2 94%  Physical Exam  Physical Exam  Constitutional: She is oriented to person, place, and time and well-developed, well-nourished, and in no distress. No distress.  HENT:  Head: Normocephalic and atraumatic.  Eyes: Conjunctivae are normal.  Neck: Neck supple.  No thyromegaly present.  Cardiovascular: Normal rate, regular rhythm and normal heart sounds.   No murmur heard. Pulmonary/Chest: Effort normal and breath sounds normal. She has no wheezes.  Abdominal: She exhibits no distension and no mass.  Musculoskeletal: She exhibits no edema.  Lymphadenopathy:    She has no cervical adenopathy.  Neurological: She is alert and oriented to person, place, and time.  Skin: Skin is warm and dry. No rash noted. She is not diaphoretic.  Psychiatric: Memory, affect and judgment normal.       Assessment & Plan   Elevated BP Well controlled, encouraged heart healthy diet such as the DASH diet and exercise as tolerated.   Dyslipidemia Encouraged heart healthy diet, increase exercise, avoid trans fats, consider a krill oil cap daily  Osteoporosis Vitamin D dao;u. Calcium 1500 mg daily divided tid, encouraged to consider treatment with Prolia.  Maintain exercise  Depression with anxiety Doing well. Given refill on Lorazepam

## 2015-11-03 ENCOUNTER — Encounter: Payer: Self-pay | Admitting: Gastroenterology

## 2015-11-03 ENCOUNTER — Other Ambulatory Visit: Payer: Self-pay | Admitting: Family Medicine

## 2016-01-08 ENCOUNTER — Other Ambulatory Visit: Payer: Self-pay | Admitting: Oncology

## 2016-01-08 NOTE — Telephone Encounter (Signed)
Chart reviewed.

## 2016-02-02 LAB — TSH: TSH: 1.76 u[IU]/mL (ref <=5.90)

## 2016-02-02 LAB — HEPATIC FUNCTION PANEL
ALK PHOS: 62 U/L (ref 25–125)
ALT: 14 U/L (ref 7–35)
AST: 15 U/L (ref 13–35)
Bilirubin, Total: 0.4 mg/dL

## 2016-02-02 LAB — BASIC METABOLIC PANEL
BUN: 12 mg/dL (ref 4–21)
Glucose: 92 mg/dL
Sodium: 140 mmol/L (ref 137–147)

## 2016-02-02 LAB — LIPID PANEL
Cholesterol: 193 mg/dL (ref 0–200)
HDL: 43 mg/dL (ref 35–70)
LDL Cholesterol: 127 mg/dL
Triglycerides: 113 mg/dL (ref 40–160)

## 2016-02-02 LAB — HEMOGLOBIN A1C: HEMOGLOBIN A1C: 5.6

## 2016-03-29 ENCOUNTER — Encounter: Payer: Self-pay | Admitting: Family Medicine

## 2016-03-29 ENCOUNTER — Ambulatory Visit (INDEPENDENT_AMBULATORY_CARE_PROVIDER_SITE_OTHER): Payer: 59 | Admitting: Family Medicine

## 2016-03-29 VITALS — BP 110/78 | HR 81 | Temp 97.9°F | Ht 65.0 in | Wt 162.5 lb

## 2016-03-29 DIAGNOSIS — F32A Depression, unspecified: Secondary | ICD-10-CM

## 2016-03-29 DIAGNOSIS — R030 Elevated blood-pressure reading, without diagnosis of hypertension: Secondary | ICD-10-CM | POA: Diagnosis not present

## 2016-03-29 DIAGNOSIS — Z0000 Encounter for general adult medical examination without abnormal findings: Secondary | ICD-10-CM

## 2016-03-29 DIAGNOSIS — F329 Major depressive disorder, single episode, unspecified: Secondary | ICD-10-CM

## 2016-03-29 DIAGNOSIS — IMO0001 Reserved for inherently not codable concepts without codable children: Secondary | ICD-10-CM

## 2016-03-29 DIAGNOSIS — F418 Other specified anxiety disorders: Secondary | ICD-10-CM | POA: Diagnosis not present

## 2016-03-29 DIAGNOSIS — M6740 Ganglion, unspecified site: Secondary | ICD-10-CM

## 2016-03-29 DIAGNOSIS — F419 Anxiety disorder, unspecified: Secondary | ICD-10-CM

## 2016-03-29 MED ORDER — ESCITALOPRAM OXALATE 10 MG PO TABS: 10.0000 mg | ORAL_TABLET | Freq: Every day | ORAL | Status: DC

## 2016-03-29 MED ORDER — LORAZEPAM 0.5 MG PO TABS: 0.5000 mg | ORAL_TABLET | Freq: Two times a day (BID) | ORAL | Status: AC | PRN

## 2016-03-29 NOTE — Patient Instructions (Signed)
Rub Aspercreme into cyst firmly to break up.  Ganglion Cyst A ganglion cyst is a noncancerous, fluid-filled lump that occurs near joints or tendons. The ganglion cyst grows out of a joint or the lining of a tendon. It most often develops in the hand or wrist, but it can also develop in the shoulder, elbow, hip, knee, ankle, or foot. The round or oval ganglion cyst can be the size of a pea or larger than a grape. Increased activity may enlarge the size of the cyst because more fluid starts to build up.  CAUSES It is not known what causes a ganglion cyst to grow. However, it may be related to:  Inflammation or irritation around the joint.  An injury.  Repetitive movements or overuse.  Arthritis. RISK FACTORS Risk factors include:  Being a woman.  Being age 76-50. SIGNS AND SYMPTOMS Symptoms may include:   A lump. This most often appears on the hand or wrist, but it can occur in other areas of the body.  Tingling.  Pain.  Numbness.  Muscle weakness.  Weak grip.  Less movement in a joint. DIAGNOSIS Ganglion cysts are most often diagnosed based on a physical exam. Your health care provider will feel the lump and may shine a light alongside it. If it is a ganglion cyst, a light often shines through it. Your health care provider may order an X-ray, ultrasound, or MRI to rule out other conditions. TREATMENT Ganglion cysts usually go away on their own without treatment. If pain or other symptoms are involved, treatment may be needed. Treatment is also needed if the ganglion cyst limits your movement or if it gets infected. Treatment may include:  Wearing a brace or splint on your wrist or finger.  Taking anti-inflammatory medicine.  Draining fluid from the lump with a needle (aspiration).  Injecting a steroid into the joint.  Surgery to remove the ganglion cyst. HOME CARE INSTRUCTIONS  Do not press on the ganglion cyst, poke it with a needle, or hit it.  Take medicines  only as directed by your health care provider.  Wear your brace or splint as directed by your health care provider.  Watch your ganglion cyst for any changes.  Keep all follow-up visits as directed by your health care provider. This is important. SEEK MEDICAL CARE IF:  Your ganglion cyst becomes larger or more painful.  You have increased redness, red streaks, or swelling.  You have pus coming from the lump.  You have weakness or numbness in the affected area.  You have a fever or chills.   This information is not intended to replace advice given to you by your health care provider. Make sure you discuss any questions you have with your health care provider.   Document Released: 11/11/2000 Document Revised: 12/05/2014 Document Reviewed: 04/29/2014 Elsevier Interactive Patient Education [employer].

## 2016-03-29 NOTE — Progress Notes (Signed)
Subjective:    Patient ID: [name], female    DOB: May 14, 1951, 65 y.o.   MRN: [id number]  Chief Complaint  Patient presents with  . Annual Exam    HPI Patient is in today for Annual Physical Exam.  Patient has a ganglion cyst on her right wrist.  It is large does cause some pain and has been present a couple of months. No recent illness or other acute concerns. Doing well with ADLs at home. No polyuria or polydipsia. Denies CP/palp/SOB/HA/congestion/fevers/GI or GU c/o. Taking meds as prescribed  Past Medical History  Diagnosis Date  . Chicken pox   . Left hip pain 05/04/2011  . Colon polyps   . Dyslipidemia   . Tobacco abuse 05/05/2011  . Depression with anxiety 09/07/2012  . Elevated BP 09/09/2012  . Preventative health care 09/09/2012  . H/O tobacco use, presenting hazards to health 05/05/2011  . Cancer [facility]) [id number]    Breast  . Osteoporosis 2014    T score - 2.5    Past Surgical History  Procedure Laterality Date  . Appendectomy  2010  . Breast lumpectomy  1998    right  . Breast surgery  07/08/11    right mastectomy+axlnd, reconstruction,T1cN1a,ERPR+,HER2-  . Breast surgery  march,2013    breast implant deflated    Family History  Problem Relation Age of Onset  . Diabetes Mother     type 2  . Obesity Mother   . Cancer Father     ?  . Colon cancer Neg Hx   . Esophageal cancer Neg Hx   . Rectal cancer Neg Hx   . Stomach cancer Neg Hx   . Heart block Sister   . Heart disease Sister     s/p CABG  . Heart disease Brother     s/p CABG    Social History   Social History  . Marital Status: Married    Spouse Name: N/A  . Number of Children: N/A  . Years of Education: N/A   Occupational History  . Not on file.   Social History Main Topics  . Smoking status: Former Research scientist (life sciences)  . Smokeless tobacco: Never Used  . Alcohol Use: 0.0 oz/week    0 Standard drinks or equivalent per week     Comment: occasionally  . Drug Use: No  . Sexual Activity: Not  Currently     Comment: 1st intercourse 18 yo-5 partners   Other Topics Concern  . Not on file   Social History Narrative    Outpatient Prescriptions Prior to Visit  Medication Sig Dispense Refill  . anastrozole (ARIMIDEX) 1 MG tablet TAKE 1 TABLET BY MOUTH DAILY 30 tablet 4  . calcium-vitamin D (OSCAL WITH D) 500-200 MG-UNIT per tablet Take 1 tablet by mouth 2 (two) times daily.    Marland Kitchen gabapentin (NEURONTIN) 300 MG capsule TAKE ONE CAPSULE BY MOUTH AT BEDTIME 30 capsule 4  . ibuprofen (ADVIL,MOTRIN) 400 MG tablet Take 400 mg by mouth every 6 (six) hours as needed for pain.    . Zoledronic Acid (ZOMETA) 4 MG/100ML IVPB Inject 100 mLs (4 mg total) into the vein once. 100 mL 0  . escitalopram (LEXAPRO) 10 MG tablet TAKE 1 TABLET BY MOUTH ONCE DAILY 30 tablet 6  . LORazepam (ATIVAN) 0.5 MG tablet Take 1 tablet (0.5 mg total) by mouth 2 (two) times daily as needed for anxiety. 30 tablet 2  . acyclovir (ZOVIRAX) 400 MG tablet Take 1 tablet (400 mg  total) by mouth 3 (three) times daily. X 5 days (Patient not taking: Reported on 03/29/2016) 15 tablet 2   No facility-administered medications prior to visit.    No Known Allergies  Review of Systems  Constitutional: Negative for fever and malaise/fatigue.  HENT: Negative for congestion.   Eyes: Negative for blurred vision.  Respiratory: Negative for shortness of breath.   Cardiovascular: Negative for chest pain, palpitations and leg swelling.  Gastrointestinal: Negative for nausea, abdominal pain and blood in stool.  Genitourinary: Negative for dysuria and frequency.  Musculoskeletal: Negative for falls.  Skin: Negative for rash.  Neurological: Negative for dizziness, loss of consciousness and headaches.  Endo/Heme/Allergies: Negative for environmental allergies.  Psychiatric/Behavioral: Negative for depression. The patient is not nervous/anxious.        Objective:    Physical Exam  Constitutional: She is oriented to person, place, and  time. She appears well-developed and well-nourished. No distress.  HENT:  Head: Normocephalic and atraumatic.  Eyes: Conjunctivae are normal.  Neck: Neck supple. No thyromegaly present.  Cardiovascular: Normal rate, regular rhythm and normal heart sounds.   No murmur heard. Pulmonary/Chest: Effort normal and breath sounds normal. No respiratory distress.  Abdominal: Soft. Bowel sounds are normal. She exhibits no distension and no mass. There is no tenderness.  Musculoskeletal: She exhibits no edema.  Lymphadenopathy:    She has no cervical adenopathy.  Neurological: She is alert and oriented to person, place, and time.  Skin: Skin is warm and dry.  Psychiatric: She has a normal mood and affect. Her behavior is normal.    BP 110/78 mmHg  Pulse 81  Temp(Src) 97.9 F (36.6 C) (Oral)  Ht _0  (1.651 m)  Wt 162 lb 8 oz (73.71 kg)  BMI 27.04 kg/m2  SpO2 94% Wt Readings from Last 3 Encounters:  03/29/16 162 lb 8 oz (73.71 kg)  09/08/15 162 lb 2 oz (73.539 kg)  08/06/15 160 lb 9.6 oz (72.848 kg)     Lab Results  Component Value Date   WBC 7.8 08/06/2015   HGB 13.6 08/06/2015   HCT 41.7 08/06/2015   PLT 298 08/06/2015   GLUCOSE 84 08/06/2015   CHOL 193 02/02/2016   TRIG 113 02/02/2016   HDL 43 02/02/2016   LDLDIRECT 149.1 05/10/2011   LDLCALC 127 02/02/2016   ALT 14 02/02/2016   AST 15 02/02/2016   NA 140 02/02/2016   K 3.6 08/06/2015   CL 102 10/09/2014   CREATININE 0.7 08/06/2015   BUN 12 02/02/2016   CO2 26 08/06/2015   TSH 1.76 02/02/2016   HGBA1C 5.6 02/02/2016    Lab Results  Component Value Date   TSH 1.76 02/02/2016   Lab Results  Component Value Date   WBC 7.8 08/06/2015   HGB 13.6 08/06/2015   HCT 41.7 08/06/2015   MCV 90.5 08/06/2015   PLT 298 08/06/2015   Lab Results  Component Value Date   NA 140 02/02/2016   K 3.6 08/06/2015   CHLORIDE 106 08/06/2015   CO2 26 08/06/2015   GLUCOSE 84 08/06/2015   BUN 12 02/02/2016   CREATININE 0.7  08/06/2015   BILITOT 0.36 08/06/2015   ALKPHOS 62 02/02/2016   AST 15 02/02/2016   ALT 14 02/02/2016   PROT 7.3 08/06/2015   ALBUMIN 3.9 08/06/2015   CALCIUM 9.1 08/06/2015   ANIONGAP 9 08/06/2015   EGFR >90 08/06/2015   GFR 107.13 10/09/2014   Lab Results  Component Value Date   CHOL 193 02/02/2016  Lab Results  Component Value Date   HDL 43 02/02/2016   Lab Results  Component Value Date   LDLCALC 127 02/02/2016   Lab Results  Component Value Date   TRIG 113 02/02/2016   Lab Results  Component Value Date   CHOLHDL 5 10/09/2014   Lab Results  Component Value Date   HGBA1C 5.6 02/02/2016       Assessment & Plan:   Problem List Items Addressed This Visit    Preventative health care    Patient encouraged to maintain heart healthy diet, regular exercise, adequate sleep. Consider daily probiotics. Take medications as prescribed. Given and reviewed copy of ACP documents from [facility] and encouraged to complete and return      Ganglion cyst    Encouraged to massage with Salon Pas gel bid, avoid strenuous use and seek care if worsens.      Elevated BP    Well controlled.  Encouraged heart healthy diet such as the DASH diet and exercise as tolerated.       Anxiety and depression - Primary   Relevant Medications   LORazepam (ATIVAN) 0.5 MG tablet      I have changed Ms. Reller's escitalopram. I am also having her maintain her ibuprofen, calcium-vitamin D, acyclovir, gabapentin, Zoledronic Acid, anastrozole, and LORazepam.  Meds ordered this encounter  Medications  . LORazepam (ATIVAN) 0.5 MG tablet    Sig: Take 1 tablet (0.5 mg total) by mouth 2 (two) times daily as needed for anxiety.    Dispense:  30 tablet    Refill:  3  . escitalopram (LEXAPRO) 10 MG tablet    Sig: Take 1 tablet (10 mg total) by mouth daily.    Dispense:  30 tablet    Refill:  5     Penni Homans, MD

## 2016-03-29 NOTE — Progress Notes (Signed)
Pre visit review using our clinic review tool, if applicable. No additional management support is needed unless otherwise documented below in the visit note. 

## 2016-04-10 DIAGNOSIS — F329 Major depressive disorder, single episode, unspecified: Secondary | ICD-10-CM | POA: Insufficient documentation

## 2016-04-10 DIAGNOSIS — F419 Anxiety disorder, unspecified: Secondary | ICD-10-CM

## 2016-04-10 DIAGNOSIS — F32A Depression, unspecified: Secondary | ICD-10-CM | POA: Insufficient documentation

## 2016-04-10 DIAGNOSIS — M6740 Ganglion, unspecified site: Secondary | ICD-10-CM | POA: Insufficient documentation

## 2016-04-10 NOTE — Assessment & Plan Note (Signed)
Well controlled. Encouraged heart healthy diet such as the DASH diet and exercise as tolerated.  

## 2016-04-10 NOTE — Assessment & Plan Note (Signed)
Patient encouraged to maintain heart healthy diet, regular exercise, adequate sleep. Consider daily probiotics. Take medications as prescribed. Given and reviewed copy of ACP documents from [place] Secretary of State and encouraged to complete and return 

## 2016-04-10 NOTE — Assessment & Plan Note (Signed)
Encouraged to massage with Salon Pas gel bid, avoid strenuous use and seek care if worsens.

## 2016-06-18 ENCOUNTER — Other Ambulatory Visit: Payer: Self-pay | Admitting: Oncology

## 2016-06-20 NOTE — Telephone Encounter (Signed)
Chart reviewed.

## 2016-08-24 LAB — HM MAMMOGRAPHY

## 2016-08-30 ENCOUNTER — Encounter: Payer: Self-pay | Admitting: Family Medicine

## 2016-08-31 ENCOUNTER — Other Ambulatory Visit: Payer: Self-pay | Admitting: *Deleted

## 2016-08-31 DIAGNOSIS — C50011 Malignant neoplasm of nipple and areola, right female breast: Secondary | ICD-10-CM

## 2016-09-01 ENCOUNTER — Ambulatory Visit (HOSPITAL_BASED_OUTPATIENT_CLINIC_OR_DEPARTMENT_OTHER): Payer: 59 | Admitting: Oncology

## 2016-09-01 ENCOUNTER — Ambulatory Visit (HOSPITAL_BASED_OUTPATIENT_CLINIC_OR_DEPARTMENT_OTHER): Payer: 59

## 2016-09-01 ENCOUNTER — Other Ambulatory Visit (HOSPITAL_BASED_OUTPATIENT_CLINIC_OR_DEPARTMENT_OTHER): Payer: 59

## 2016-09-01 VITALS — BP 136/63 | HR 69 | Temp 97.8°F | Resp 18 | Ht 65.0 in | Wt 159.9 lb

## 2016-09-01 DIAGNOSIS — M810 Age-related osteoporosis without current pathological fracture: Secondary | ICD-10-CM | POA: Diagnosis not present

## 2016-09-01 DIAGNOSIS — Z170 Estrogen receptor positive status [ER+]: Secondary | ICD-10-CM

## 2016-09-01 DIAGNOSIS — C50911 Malignant neoplasm of unspecified site of right female breast: Secondary | ICD-10-CM

## 2016-09-01 DIAGNOSIS — C50011 Malignant neoplasm of nipple and areola, right female breast: Secondary | ICD-10-CM

## 2016-09-01 DIAGNOSIS — Z79811 Long term (current) use of aromatase inhibitors: Secondary | ICD-10-CM | POA: Diagnosis not present

## 2016-09-01 LAB — CBC WITH DIFFERENTIAL/PLATELET
BASO%: 1 % (ref 0.0–2.0)
Basophils Absolute: 0.1 10*3/uL (ref 0.0–0.1)
EOS ABS: 0.1 10*3/uL (ref 0.0–0.5)
EOS%: 1.3 % (ref 0.0–7.0)
HEMATOCRIT: 42.9 % (ref 34.8–46.6)
HGB: 13.9 g/dL (ref 11.6–15.9)
LYMPH%: 26.7 % (ref 14.0–49.7)
MCH: 29.2 pg (ref 25.1–34.0)
MCHC: 32.5 g/dL (ref 31.5–36.0)
MCV: 90 fL (ref 79.5–101.0)
MONO#: 0.4 10*3/uL (ref 0.1–0.9)
MONO%: 5.2 % (ref 0.0–14.0)
NEUT%: 65.8 % (ref 38.4–76.8)
NEUTROS ABS: 5.1 10*3/uL (ref 1.5–6.5)
PLATELETS: 295 10*3/uL (ref 145–400)
RBC: 4.77 10*6/uL (ref 3.70–5.45)
RDW: 13.5 % (ref 11.2–14.5)
WBC: 7.7 10*3/uL (ref 3.9–10.3)
lymph#: 2.1 10*3/uL (ref 0.9–3.3)

## 2016-09-01 LAB — COMPREHENSIVE METABOLIC PANEL
ALT: 16 U/L (ref 0–55)
ANION GAP: 9 meq/L (ref 3–11)
AST: 17 U/L (ref 5–34)
Albumin: 3.8 g/dL (ref 3.5–5.0)
Alkaline Phosphatase: 70 U/L (ref 40–150)
BILIRUBIN TOTAL: 0.39 mg/dL (ref 0.20–1.20)
BUN: 15.6 mg/dL (ref 7.0–26.0)
CALCIUM: 9.2 mg/dL (ref 8.4–10.4)
CO2: 24 meq/L (ref 22–29)
CREATININE: 0.6 mg/dL (ref 0.6–1.1)
Chloride: 106 mEq/L (ref 98–109)
EGFR: >90 mL/min/{1.73_m2} (ref >90)
Glucose: 83 mg/dl (ref 70–140)
Potassium: 4 mEq/L (ref 3.5–5.1)
Sodium: 139 mEq/L (ref 136–145)
TOTAL PROTEIN: 7.5 g/dL (ref 6.4–8.3)

## 2016-09-01 MED ORDER — GABAPENTIN 300 MG PO CAPS: ORAL_CAPSULE | ORAL | 4 refills | Status: DC

## 2016-09-01 MED ORDER — ZOLEDRONIC ACID 4 MG/100ML IV SOLN
4.0000 mg | Freq: Once | INTRAVENOUS | Status: AC
  Administered 2016-09-01: 4 mg via INTRAVENOUS
  Filled 2016-09-01: qty 100

## 2016-09-01 MED ORDER — KETOCONAZOLE 2 % EX CREA: 1.0000 "application " | TOPICAL_CREAM | Freq: Every day | CUTANEOUS | 0 refills | Status: DC

## 2016-09-01 MED ORDER — SODIUM CHLORIDE 0.9 % IV SOLN
INTRAVENOUS | Status: DC
  Administered 2016-09-01: 17:00:00 via INTRAVENOUS

## 2016-09-01 NOTE — Progress Notes (Signed)
ID: [name]   DOB: [birth year]  MR#: [id number]  CSN#:[id number]  PCP: Penni Homans, MD GYN:  SU:  OTHER MD: Marolyn Hammock, DDS  CHIEF COMPLAINT: right breast cancer CURRENT TREATMENT: Completing 5 years of anti-estrogen therapy  HISTORY OF PRESENT ILLNESS: From the earlier summary note:  The patient formally saw Dr. Truddie Coco for a remote history of right breast cancer status post lumpectomy and axillary lymph node dissection in August 1998.  This was a T1c N0, estrogen and progesterone receptor positive tumor, which was treated adjuvantly with doxorubicin and cyclophosphamide x4 followed by adjuvant radiation completed in March 1998 followed by 5 years of tamoxifen completed in January 2004.  More recently, screening mammography at [facility], June 2012, found a possible abnormality in the right breast.  The patient was brought back for additional views the next day and additional spot compression views were obtained, with the report not available in our system.  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   With this information, the patient was referred for bilateral breast MRIs, May 15, 2011.  In addition to the prior lumpectomy changes, there was an enhancing irregular mass in the lower-outer quadrant of the right breast measuring 1.6 cm.  The rest of the MRI exam was unremarkable except for a mildly prominent right axillary lymph node.  With this information, the patient was presented at the Mult[facility]isciplinary [facility] and the recommendation was made for mastectomy with  an Oncotype to be sent at the same time.   The patient did undergo right mastectomy, Dr. Marlou Starks, with sentinel lymph node biopsy, and immediate reconstruction with a saline implant and right latissimus myocutaneous flap under Crissie Reese, August 10th.  The pathology from this procedure ([id number]) confirmed a well-differentiated invasive ductal carcinoma measuring 1.5 cm with ample margins and 1/6 lymph nodes involved.  HER2 was repeated and was again negative.   With this information, the patient was seen by Dr. Truddie Coco and started on anastrozole as well as Delton See given her history of osteoporosis.  She has also been seen by Genetics and had an Oncotype DX as noted below. She established herself in my service October 2012.  INTERVAL HISTORY: [name] returns today for follow up of her estrogen receptor positive breast cancer. She continues on anastrozole, with good tolerance.Hot flashes and vaginal dryness are not a major issue. She never developed the arthralgias or myalgias that many patients can experience on this medication. She obtains it at a good price.  REVIEW OF SYSTEMS: [name] is generally doing "fine". The only complaint she has is that her right latissimus flap Polson feels tight and sometimes wakes her up at night. A detailed review of systems today was otherwise negative   PAST MEDICAL HISTORY: Past Medical History:  Diagnosis Date  . Cancer [facility]) [id number]   Breast  . Chicken pox   . Colon polyps   . Depression with anxiety 09/07/2012  . Dyslipidemia   . Elevated BP 09/09/2012  . H/O tobacco use, presenting hazards to health 05/05/2011  . Left hip pain 05/04/2011  . Osteoporosis 2014   T score -  2.5  . Preventative health care 09/09/2012  . Tobacco abuse 05/05/2011  Significant for appendectomy remotely, history of osteopenia/osteoporosis, history of dyslipidemia, and history of 15 pack-year tobacco abuse.   PAST SURGICAL HISTORY: Past Surgical History:  Procedure Laterality Date   . APPENDECTOMY  2010  . BREAST LUMPECTOMY  1998   right  . BREAST SURGERY  07/08/11   right mastectomy+axlnd, reconstruction,T1cN1a,ERPR+,HER2-  . BREAST SURGERY  march,2013   breast implant deflated    FAMILY HISTORY Family History  Problem Relation Age of Onset  . Diabetes Mother     type 2  . Obesity Mother   . Cancer Father     ?  . Colon cancer Neg Hx   . Esophageal cancer Neg Hx   . Rectal cancer Neg Hx   . Stomach cancer Neg Hx   . Heart block Sister   . Heart disease Sister     s/p CABG  . Heart disease Brother     s/p CABG  This is essentially noncontributory.  The patient's father did die from cancer but they do not know the primary.  It seems to have been most likely a lung cancer that was widely metastatic at diagnosis.  He was 65 years old at that time.  Otherwise, there is no history of breast or ovarian cancer in the patient's family  GYNECOLOGIC HISTORY: She had menarche, age 65.  Menopause at age 65.  She is GX, P2.  First pregnancy to term, age 65.  She never took hormone replacement.  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 Marland Kitchen  Her husband Reynolds Bowl") Soderman has a history of prostate cancer, was in Architect but is now retired.  They have 2 children, a son and a daughter, both living in this area and 2 grandchildren.  The patient is not a church attender.  HEALTH MAINTENANCE: (Updated 08/19/2013) Social History  Substance Use Topics  . Smoking status: Former Research scientist (life sciences)  . Smokeless tobacco: Never Used  . Alcohol use 0.0 oz/week     Comment: occasionally  As noted, the patient had a 15 pack-year tobacco abuse history but has quit.  There is no history of alcohol abuse.     Colonoscopy:  05/17/2013, Dr. Fuller Plan  PAP: June 2012, Dr. Leonel Ramsay  Bone density: 02/19/2013, [facility], osteoporosis with a T score of -2.5  Lipid panel: UTD, Dr. Randel Pigg  No Known  Allergies  Current Outpatient Prescriptions  Medication Sig Dispense Refill  . acyclovir (ZOVIRAX) 400 MG tablet Take 1 tablet (400 mg total) by mouth 3 (three) times daily. X 5 days (Patient not taking: Reported on 03/29/2016) 15 tablet 2  . anastrozole (ARIMIDEX) 1 MG tablet TAKE 1 TABLET BY MOUTH DAILY 30 tablet 3  . calcium-vitamin D (OSCAL WITH D) 500-200 MG-UNIT per tablet Take 1 tablet by mouth 2 (two) times daily.    Marland Kitchen escitalopram (LEXAPRO) 10 MG tablet Take 1 tablet (10 mg total) by mouth daily. 30 tablet 5  . gabapentin (NEURONTIN) 300 MG capsule TAKE ONE CAPSULE BY MOUTH AT BEDTIME 30 capsule 4  . ibuprofen (ADVIL,MOTRIN) 400 MG tablet Take 400 mg by mouth every 6 (six) hours as needed for pain.    Marland Kitchen LORazepam (ATIVAN) 0.5 MG tablet Take 1 tablet (0.5 mg total) by mouth 2 (two) times daily as needed for anxiety. 30 tablet 3  . Zoledronic Acid (ZOMETA) 4 MG/100ML IVPB Inject  100 mLs (4 mg total) into the vein once. 100 mL 0   No current facility-administered medications for this visit.     OBJECTIVE: Middle-aged white woman Who appears stated age  89:   09/01/16 1548  BP: 136/63  Pulse: 69  Resp: 18  Temp: 97.8 F (36.6 C)     Body mass index is 26.61 kg/m.    ECOG FS: 0 Filed Weights   09/01/16 1548  Weight: 159 lb 14.4 oz (72.5 kg)   Sclerae unicteric, EOMs intact Oropharynx clear and moist No cervical or supraclavicular adenopathy Lungs no rales or rhonchi Heart regular rate and rhythm Abd soft, nontender, positive bowel sounds MSK no focal spinal tenderness, no upper extremity lymphedema Neuro: nonfocal, well oriented, appropriate affect Breasts: The right breast is status post mastectomy, with flap reconstruction. There is no evidence of local recurrence. There is no tenderness to palpation. There is no erythema or swelling. The right axilla is benign. The left breast is unremarkable.   LAB RESULTS: Lab Results  Component Value Date   WBC 7.7 09/01/2016    NEUTROABS 5.1 09/01/2016   HGB 13.9 09/01/2016   HCT 42.9 09/01/2016   MCV 90.0 09/01/2016   PLT 295 09/01/2016      Chemistry      Component Value Date/Time   NA 139 09/01/2016 1452   K 4.0 09/01/2016 1452   CL 102 10/09/2014 1449   CL 103 02/12/2013 1234   CO2 24 09/01/2016 1452   BUN 15.6 09/01/2016 1452   CREATININE 0.6 09/01/2016 1452   GLU 92 02/02/2016      Component Value Date/Time   CALCIUM 9.2 09/01/2016 1452   ALKPHOS 70 09/01/2016 1452   AST 17 09/01/2016 1452   ALT 16 09/01/2016 1452   BILITOT 0.39 09/01/2016 1452     STUDIES: Left mammography 08/24/2016 was unremarkable   ASSESSMENT: 64 y.o. [facility] woman status post:  (1) Right lumpectomy and axillary lymph node dissection  1998, for a T1c N0, estrogen receptor positive breast cancer, treated adjuvantly with doxorubicin and cyclophosphamide times 4, followed by radiation then tamoxifen for 5 years   (2) Right mastectomy with immediate implant and latissimus flap reconstruction, August 2012, for a T1c N1, grade 1, invasive ductal carcinoma, estrogen and progesterone receptor positive, HER2 negative, with an MIB1 of 13%.  (3) her Oncotype DX  recurrence score was 14 predicting for a 9% risk of distant recurrence within 10 years if she took 5 years of tamoxifen.    (4) on anastrozole as of November 2012, completing 5 years November 2017   (5) with a history of osteoporosis, on denosumab initially, switched to zolendronic acid March 2013 because of possible further reduction in breast cancer recurrence, to continue every 6 months for 2 years, then yearly beginning September 2014, and continuing while on anastrozole, final dose 09/01/2016   PLAN:  Brittney is now 5 years out from definitive surgery for breast cancer with no evidence of recurrence. This is very favorable.  She has completed 5 years of anti-estrogens. The benefit of continuing an additional 5 years is minimal and in her case would be offset by  worsening concerns regarding osteoporosis.  Accordingly we are stopping the anastrozole when she runs out of her current supply. We are also stopping the zolendronate after today's dose  As far as breast cancer follow-up is concerned although and will need is yearly left mammography and yearly physician breast exam  I will be glad to see  her at any point in the future if on when the need arises, but as of now we are making no further routine appointments for her here.  Manreet Kiernan C    09/01/2016

## 2016-09-01 NOTE — Patient Instructions (Signed)

## 2016-10-26 ENCOUNTER — Other Ambulatory Visit: Payer: Self-pay | Admitting: Family Medicine

## 2017-01-06 ENCOUNTER — Other Ambulatory Visit: Payer: Self-pay

## 2017-01-06 MED ORDER — ESCITALOPRAM OXALATE 10 MG PO TABS: 10.0000 mg | ORAL_TABLET | Freq: Every day | ORAL | 5 refills | Status: DC

## 2017-05-26 ENCOUNTER — Other Ambulatory Visit: Payer: Self-pay | Admitting: Family Medicine

## 2017-05-29 NOTE — Telephone Encounter (Signed)
Completed.

## 2017-05-30 ENCOUNTER — Ambulatory Visit (INDEPENDENT_AMBULATORY_CARE_PROVIDER_SITE_OTHER): Payer: 59 | Admitting: Family Medicine

## 2017-05-30 ENCOUNTER — Encounter: Payer: Self-pay | Admitting: Family Medicine

## 2017-05-30 VITALS — BP 120/78 | HR 71 | Temp 97.7°F | Resp 18 | Wt 157.0 lb

## 2017-05-30 DIAGNOSIS — E785 Hyperlipidemia, unspecified: Secondary | ICD-10-CM | POA: Diagnosis not present

## 2017-05-30 DIAGNOSIS — M810 Age-related osteoporosis without current pathological fracture: Secondary | ICD-10-CM

## 2017-05-30 DIAGNOSIS — Z170 Estrogen receptor positive status [ER+]: Secondary | ICD-10-CM

## 2017-05-30 DIAGNOSIS — Z0131 Encounter for examination of blood pressure with abnormal findings: Secondary | ICD-10-CM

## 2017-05-30 DIAGNOSIS — C50911 Malignant neoplasm of unspecified site of right female breast: Secondary | ICD-10-CM

## 2017-05-30 DIAGNOSIS — M25552 Pain in left hip: Secondary | ICD-10-CM | POA: Diagnosis not present

## 2017-05-30 DIAGNOSIS — E876 Hypokalemia: Secondary | ICD-10-CM

## 2017-05-30 MED ORDER — TRAMADOL HCL 50 MG PO TABS: 50.0000 mg | ORAL_TABLET | Freq: Three times a day (TID) | ORAL | 0 refills | Status: DC | PRN

## 2017-05-30 MED ORDER — ESCITALOPRAM OXALATE 10 MG PO TABS: 10.0000 mg | ORAL_TABLET | Freq: Every day | ORAL | 0 refills | Status: DC

## 2017-05-30 MED ORDER — ESCITALOPRAM OXALATE 10 MG PO TABS: 10.0000 mg | ORAL_TABLET | Freq: Every day | ORAL | 1 refills | Status: DC

## 2017-05-30 NOTE — Assessment & Plan Note (Signed)
Encouraged heart healthy diet, increase exercise, avoid trans fats, consider a krill oil cap daily 

## 2017-05-30 NOTE — Progress Notes (Signed)
Subjective:  I acted as a Education administrator for Dr. Charlett Mitchell. Brittany, Mitchell  Patient ID: [name], female    DOB: 1951/01/10, 66 y.o.   MRN: [id number]  No chief complaint on file.   HPI  Patient is in today for a medication follow up. Patient states she has been doing well on the Lexapro. She still gets Agitated at times and irritable at work but overall she feels this dose of Lexapro Holter study. She endorses anhedonia but no suicidal ideation. She is complaining of some myalgias in her thighs with walking and ongoing difficulty with joint pain such as her left hip. She has used tramadol in the past with good relief and is questioning whether she may have a refill. Has been using Advil 400 mg with only minor relief. No recent febrile illness or other acute concerns are noted. No recent hospitalizations. Denies CP/palp/SOB/HA/congestion/fevers/GI or GU c/o. Taking meds as prescribed  Patient Care Team: Mosie Lukes, MD as PCP - General (Family Medicine) Ladene Artist, MD as Consulting Physician (Gastroenterology) Phineas Real Belinda Block, MD as Consulting Physician (Gynecology)   Past Medical History:  Diagnosis Date  . Cancer [facility]) [id number]   Breast  . Chicken pox   . Colon polyps   . Depression with anxiety 09/07/2012  . Dyslipidemia   . Elevated BP 09/09/2012  . H/O tobacco use, presenting hazards to health 05/05/2011  . Left hip pain 05/04/2011  . Osteoporosis 2014   T score - 2.5  . Preventative health care 09/09/2012  . Tobacco abuse 05/05/2011    Past Surgical History:  Procedure Laterality Date  . APPENDECTOMY  2010  . BREAST LUMPECTOMY  1998   right  . BREAST SURGERY  07/08/11   right mastectomy+axlnd, reconstruction,T1cN1a,ERPR+,HER2-  . BREAST SURGERY  march,2013   breast implant deflated    Family History  Problem Relation Age of Onset  . Diabetes Mother        type 2  . Obesity Mother   . Cancer Father        ?  . Colon cancer Neg Hx   . Esophageal cancer  Neg Hx   . Rectal cancer Neg Hx   . Stomach cancer Neg Hx   . Heart block Sister   . Heart disease Sister        s/p CABG  . Heart disease Brother        s/p CABG    Social History   Social History  . Marital status: Married    Spouse name: N/A  . Number of children: N/A  . Years of education: N/A   Occupational History  . Not on file.   Social History Main Topics  . Smoking status: Former Research scientist (life sciences)  . Smokeless tobacco: Never Used  . Alcohol use 0.0 oz/week     Comment: occasionally  . Drug use: No  . Sexual activity: Not Currently     Comment: 1st intercourse 35 yo-5 partners   Other Topics Concern  . Not on file   Social History Narrative  . No narrative on file    Outpatient Medications Prior to Visit  Medication Sig Dispense Refill  . calcium-vitamin D (OSCAL WITH D) 500-200 MG-UNIT per tablet Take 1 tablet by mouth 2 (two) times daily.    Marland Kitchen gabapentin (NEURONTIN) 300 MG capsule TAKE ONE CAPSULE BY MOUTH AT BEDTIME 30 capsule 4  . ibuprofen (ADVIL,MOTRIN) 400 MG tablet Take 400 mg by mouth every 6 (six) hours as  needed for pain.    Marland Kitchen ketoconazole (NIZORAL) 2 % cream Apply 1 application topically daily. 15 g 0  . escitalopram (LEXAPRO) 10 MG tablet Take 1 tablet (10 mg total) by mouth daily. 30 tablet 5  . acyclovir (ZOVIRAX) 400 MG tablet Take 1 tablet (400 mg total) by mouth 3 (three) times daily. X 5 days (Patient not taking: Reported on 03/29/2016) 15 tablet 2   No facility-administered medications prior to visit.     No Known Allergies  Review of Systems  Constitutional: Negative for fever and malaise/fatigue.  HENT: Negative for congestion.   Eyes: Negative for blurred vision.  Respiratory: Negative for cough and shortness of breath.   Cardiovascular: Negative for chest pain, palpitations and leg swelling.  Gastrointestinal: Negative for vomiting.  Musculoskeletal: Positive for joint pain and myalgias. Negative for back pain.  Skin: Negative for rash.    Neurological: Negative for loss of consciousness and headaches.  Psychiatric/Behavioral: Positive for depression. Negative for substance abuse and suicidal ideas. The patient is nervous/anxious.        Objective:    Physical Exam  Constitutional: She is oriented to person, place, and time. She appears well-developed and well-nourished. No distress.  HENT:  Head: Normocephalic and atraumatic.  Eyes: Conjunctivae are normal.  Neck: Normal range of motion. No thyromegaly present.  Cardiovascular: Normal rate and regular rhythm.   Pulmonary/Chest: Effort normal and breath sounds normal. She has no wheezes.  Abdominal: Soft. Bowel sounds are normal. There is no tenderness.  Musculoskeletal: Normal range of motion. She exhibits no edema or deformity.  Neurological: She is alert and oriented to person, place, and time.  Skin: Skin is warm and dry. She is not diaphoretic.  Psychiatric: She has a normal mood and affect.    BP 120/78 (BP Location: Left Arm, Patient Position: Sitting, Cuff Size: Normal)   Pulse 71   Temp 97.7 F (36.5 C) (Oral)   Resp 18   Wt 157 lb (71.2 kg)   SpO2 97%   BMI 26.13 kg/m  Wt Readings from Last 3 Encounters:  05/30/17 157 lb (71.2 kg)  09/01/16 159 lb 14.4 oz (72.5 kg)  03/29/16 162 lb 8 oz (73.7 kg)   BP Readings from Last 3 Encounters:  05/30/17 120/78  09/01/16 136/63  03/29/16 110/78     Immunization History  Administered Date(s) Administered  . Influenza Split 09/07/2012, 08/21/2014  . Influenza Whole 08/16/2013  . Influenza,inj,Quad PF,36+ Mos 08/21/2015  . Influenza-Unspecified 08/25/2016  . Pneumococcal Conjugate-13 08/29/2013  . Tdap 11/28/2010, 08/21/2014  . Zoster 11/01/2013    Health Maintenance  Topic Date Due  . Hepatitis C Screening  [birth year]  . PNA vac Low Risk Adult (2 of 2 - PPSV23) 03/27/2016  . INFLUENZA VACCINE  06/28/2017  . COLONOSCOPY  05/23/2018  . MAMMOGRAM  08/24/2018  . TETANUS/TDAP  08/21/2024  . DEXA  SCAN  Completed    Lab Results  Component Value Date   WBC 7.7 09/01/2016   HGB 13.9 09/01/2016   HCT 42.9 09/01/2016   PLT 295 09/01/2016   GLUCOSE 83 09/01/2016   CHOL 193 02/02/2016   TRIG 113 02/02/2016   HDL 43 02/02/2016   LDLDIRECT 149.1 05/10/2011   LDLCALC 127 02/02/2016   ALT 16 09/01/2016   AST 17 09/01/2016   NA 139 09/01/2016   K 4.0 09/01/2016   CL 102 10/09/2014   CREATININE 0.6 09/01/2016   BUN 15.6 09/01/2016   CO2 24 09/01/2016   TSH  1.76 02/02/2016   HGBA1C 5.6 02/02/2016    Lab Results  Component Value Date   TSH 1.76 02/02/2016   Lab Results  Component Value Date   WBC 7.7 09/01/2016   HGB 13.9 09/01/2016   HCT 42.9 09/01/2016   MCV 90.0 09/01/2016   PLT 295 09/01/2016   Lab Results  Component Value Date   NA 139 09/01/2016   K 4.0 09/01/2016   CHLORIDE 106 09/01/2016   CO2 24 09/01/2016   GLUCOSE 83 09/01/2016   BUN 15.6 09/01/2016   CREATININE 0.6 09/01/2016   BILITOT 0.39 09/01/2016   ALKPHOS 70 09/01/2016   AST 17 09/01/2016   ALT 16 09/01/2016   PROT 7.5 09/01/2016   ALBUMIN 3.8 09/01/2016   CALCIUM 9.2 09/01/2016   ANIONGAP 9 09/01/2016   EGFR >90 09/01/2016   GFR 107.13 10/09/2014   Lab Results  Component Value Date   CHOL 193 02/02/2016   Lab Results  Component Value Date   HDL 43 02/02/2016   Lab Results  Component Value Date   LDLCALC 127 02/02/2016   Lab Results  Component Value Date   TRIG 113 02/02/2016   Lab Results  Component Value Date   CHOLHDL 5 10/09/2014   Lab Results  Component Value Date   HGBA1C 5.6 02/02/2016         Assessment & Plan:   Problem List Items Addressed This Visit    Left hip pain    Myalgias noted both thigh especially with prolonged walking. She reports Tramadol has helped in past. She is allowed prescription with uds and contract and advised to first try Advil 400 mg bid with Tylenol 1000 mg bid and see if that helps. Stay active      Breast cancer, right breast  ([place])    Diagnosed at age 89 and has been released by oncology at this time, no concerns.      Dyslipidemia    Encouraged heart healthy diet, increase exercise, avoid trans fats, consider a krill oil cap daily      Relevant Orders   Lipid panel   Osteoporosis    small bowel obstruction. Check a vitamin D level      Relevant Orders   Lipid panel   VITAMIN D 25 Hydroxy (Vit-D Deficiency, Fractures)   Hypokalemia    Check a cmp today      Relevant Orders   Comprehensive metabolic panel    Other Visit Diagnoses    Encounter for examination of blood pressure with abnormal findings    -  Primary   Relevant Orders   CBC   TSH      I am having [name] start on traMADol. I am also having her maintain her ibuprofen, calcium-vitamin D, acyclovir, gabapentin, ketoconazole, and escitalopram.  Meds ordered this encounter  Medications  . DISCONTD: escitalopram (LEXAPRO) 10 MG tablet    Sig: Take 1 tablet (10 mg total) by mouth daily.    Dispense:  90 tablet    Refill:  1  . traMADol (ULTRAM) 50 MG tablet    Sig: Take 1 tablet (50 mg total) by mouth every 8 (eight) hours as needed.    Dispense:  60 tablet    Refill:  0  . escitalopram (LEXAPRO) 10 MG tablet    Sig: Take 1 tablet (10 mg total) by mouth daily.    Dispense:  90 tablet    Refill:  0    CMA served as Education administrator during this  visit. History, Physical and Plan performed by medical provider. Documentation and orders reviewed and attested to.  Penni Homans, MD

## 2017-05-30 NOTE — Assessment & Plan Note (Signed)
small bowel obstruction. Check a vitamin D level

## 2017-05-30 NOTE — Assessment & Plan Note (Signed)
Diagnosed at age 66 and has been released by oncology at this time, no concerns.

## 2017-05-30 NOTE — Assessment & Plan Note (Signed)
Myalgias noted both thigh especially with prolonged walking. She reports Tramadol has helped in past. She is allowed prescription with uds and contract and advised to first try Advil 400 mg bid with Tylenol 1000 mg bid and see if that helps. Stay active

## 2017-05-30 NOTE — Patient Instructions (Addendum)
Advil/Motrin/Ibuprofen 200 mg 2 tabs by mouth twice daily (max of 2400 mg in 24 hours) Tylenol/Acetaminophen ES 500 mg tab, 2 tabs twice daily (max of 3000 mg in 24 hours)  Covermymeds  Shingrix is the new shingles shots. 2 shots over 6 months, check with insurance and find out if they cover it and wehre it is cheaper  Osteoporosis Osteoporosis is the thinning and loss of density in the bones. Osteoporosis makes the bones more brittle, fragile, and likely to break (fracture). Over time, osteoporosis can cause the bones to become so weak that they fracture after a simple fall. The bones most likely to fracture are the bones in the hip, wrist, and spine. What are the causes? The exact cause is not known. What increases the risk? Anyone can develop osteoporosis. You may be at greater risk if you have a family history of the condition or have poor nutrition. You may also have a higher risk if you are:  Female.  66 years old or older.  A smoker.  Not physically active.  White or Asian.  Slender.  What are the signs or symptoms? A fracture might be the first sign of the disease, especially if it results from a fall or injury that would not usually cause a bone to break. Other signs and symptoms include:  Low back and neck pain.  Stooped posture.  Height loss.  How is this diagnosed? To make a diagnosis, your health care provider may:  Take a medical history.  Perform a physical exam.  Order tests, such as: ? A bone mineral density test. ? A dual-energy X-ray absorptiometry test.  How is this treated? The goal of osteoporosis treatment is to strengthen your bones to reduce your risk of a fracture. Treatment may involve:  Making lifestyle changes, such as: ? Eating a diet rich in calcium. ? Doing weight-bearing and muscle-strengthening exercises. ? Stopping tobacco use. ? Limiting alcohol intake.  Taking medicine to slow the process of bone loss or to increase bone  density.  Monitoring your levels of calcium and vitamin D.  Follow these instructions at home:  Include calcium and vitamin D in your diet. Calcium is important for bone health, and vitamin D helps the body absorb calcium.  Perform weight-bearing and muscle-strengthening exercises as directed by your health care provider.  Do not use any tobacco products, including cigarettes, chewing tobacco, and electronic cigarettes. If you need help quitting, ask your health care provider.  Limit your alcohol intake.  Take medicines only as directed by your health care provider.  Keep all follow-up visits as directed by your health care provider. This is important.  Take precautions at home to lower your risk of falling, such as: ? Keeping rooms well lit and clutter free. ? Installing safety rails on stairs. ? Using rubber mats in the bathroom and other areas that are often wet or slippery. Get help right away if: You fall or injure yourself. This information is not intended to replace advice given to you by your health care provider. Make sure you discuss any questions you have with your health care provider. Document Released: 08/24/2005 Document Revised: 04/18/2016 Document Reviewed: 04/24/2014 Elsevier Interactive Patient Education  2017 [employer].

## 2017-05-30 NOTE — Assessment & Plan Note (Signed)
Check a cmp today

## 2017-09-03 ENCOUNTER — Other Ambulatory Visit: Payer: Self-pay | Admitting: Family Medicine

## 2017-09-04 NOTE — Telephone Encounter (Signed)
Requesting: Tramadol Contract: Yes UDS: 7.03.2018 Low-risk Last OV: 7.03.2018 Next OV: Not scheduled Last Refill: 8.16.2018   Please advise

## 2017-09-05 ENCOUNTER — Telehealth: Payer: Self-pay | Admitting: Family Medicine

## 2017-09-05 NOTE — Telephone Encounter (Signed)
rx sent to pharmacy. Patient notified. 

## 2017-09-05 NOTE — Telephone Encounter (Signed)
rx sent to pharmacy

## 2017-09-05 NOTE — Telephone Encounter (Signed)
Naysa Puskas Self [phone]  [facility]  traMADol Veatrice Bourbon) 50 MG tablet  Betzy called to check on her refill, I let her know that it has been printed and waiting for doctor to sign.  Please advise patient if anything different.

## 2017-09-19 LAB — HM MAMMOGRAPHY

## 2017-09-23 ENCOUNTER — Other Ambulatory Visit: Payer: Self-pay | Admitting: Family Medicine

## 2017-09-25 NOTE — Telephone Encounter (Addendum)
Requesting:tramadol Contract:yes UDS:05/30/17 Last OV:05/30/17  Next OV:09/29/17 Last Refill: 09/04/17 #60-0   Please advise

## 2017-09-26 ENCOUNTER — Encounter: Payer: Self-pay | Admitting: Gynecology

## 2017-09-26 NOTE — Telephone Encounter (Signed)
rx faxed

## 2017-09-28 ENCOUNTER — Encounter: Payer: Self-pay | Admitting: Family Medicine

## 2017-09-29 ENCOUNTER — Ambulatory Visit (INDEPENDENT_AMBULATORY_CARE_PROVIDER_SITE_OTHER): Payer: 59 | Admitting: Gynecology

## 2017-09-29 ENCOUNTER — Encounter: Payer: Self-pay | Admitting: Gynecology

## 2017-09-29 VITALS — BP 120/80 | Ht 64.0 in | Wt 157.0 lb

## 2017-09-29 DIAGNOSIS — Z01411 Encounter for gynecological examination (general) (routine) with abnormal findings: Secondary | ICD-10-CM

## 2017-09-29 DIAGNOSIS — M810 Age-related osteoporosis without current pathological fracture: Secondary | ICD-10-CM

## 2017-09-29 DIAGNOSIS — Z853 Personal history of malignant neoplasm of breast: Secondary | ICD-10-CM | POA: Diagnosis not present

## 2017-09-29 DIAGNOSIS — N952 Postmenopausal atrophic vaginitis: Secondary | ICD-10-CM

## 2017-09-29 NOTE — Progress Notes (Signed)
    [name] 1951/11/21 [id number]        66 y.o.  G2P2 for annual gynecologic exam.  Without GYN complaints  Past medical history,surgical history, problem list, medications, allergies, family history and social history were all reviewed and documented as reviewed in the EPIC chart.  ROS:  Performed with pertinent positives and negatives included in the history, assessment and plan.   Additional significant findings : None   Exam: [name] assistant Vitals:   09/29/17 1533  BP: 120/80  Weight: 157 lb (71.2 kg)  Height: 5\' 4"  (1.626 m)   Body mass index is 26.95 kg/m.  General appearance:  Normal affect, orientation and appearance. Skin: Grossly normal HEENT: Without gross lesions.  No cervical or supraclavicular adenopathy. Thyroid normal.  Lungs:  Clear without wheezing, rales or rhonchi Cardiac: RR, without RMG Abdominal:  Soft, nontender, without masses, guarding, rebound, organomegaly or hernia Breasts:  Examined lying and sitting.  Left without masses, retractions, discharge or axillary adenopathy.  Right status post mastectomy with reconstruction with no masses or axillary adenopathy Pelvic:  Ext, BUS, Vagina: With atrophic changes  Cervix: With atrophic changes.  Pap smear done  Uterus: Anteverted, normal size, shape and contour, midline and mobile nontender   Adnexa: Without masses or tenderness    Anus and perineum: Normal   Rectovaginal: Normal sphincter tone without palpated masses or tenderness.    Assessment/Plan:  66 y.o. G2P2 female for annual gynecologic exam.   1. Postmenopausal/atrophic genital changes.  No significant hot flushes, night sweats, vaginal dryness or any vaginal bleeding.  Report any issues or bleeding. 2. Osteoporosis.  DEXA 2018.  T score -2.2.  Prior DEXA 2014 T score -2.5.  Had been on Zometa but this was discontinued.  Recommend continuing calcium vitamin D supplementation with follow-up DEXA in 2 years. 3. Pap smear 2016.   Pap smear done today.  No history of abnormal Pap smears previously. 4. Colonoscopy 2014.  Repeat at their recommended interval. 5. History of right breast cancer.  Status post mastectomy and reconstruction.  Had been on Arimidex but now discontinued.  Released from oncology.  Exam NED.  Mammography 08/2017. 6. Health maintenance.  No routine blood work done as patient reports this done elsewhere.  Follow-up 1 year, sooner as needed.   [name] MD, 3:58 PM 09/29/2017

## 2017-09-29 NOTE — Patient Instructions (Signed)
Follow-up in 1 year for annual exam, sooner if any issues. 

## 2017-09-29 NOTE — Addendum Note (Signed)
Addended by: Lorine Bears on: 09/29/2017 04:07 PM   Modules accepted: Orders

## 2017-10-03 LAB — PAP IG W/ RFLX HPV ASCU

## 2017-11-02 ENCOUNTER — Other Ambulatory Visit: Payer: Self-pay | Admitting: Family Medicine

## 2017-11-02 NOTE — Telephone Encounter (Signed)
Copied from [place] [phone]. Topic: Quick Communication - See Telephone Encounter >> Nov 02, 2017 11:01 AM Oneta Rack wrote: CRM for notification. See Telephone encounter for:   11/02/17.  Relation to OF:BPZW Call back number: [phone]  [id number] [place], [place] [phone] (Phone) [phone] (Fax)    Reason for call:  Patient requesting  traMADol (ULTRAM) 50 MG tablet, patient states she contacted pharmacy. Informed patient please allow 72 hour turnaround time, please advise

## 2017-11-03 ENCOUNTER — Other Ambulatory Visit: Payer: Self-pay | Admitting: Family Medicine

## 2017-11-03 MED ORDER — TRAMADOL HCL 50 MG PO TABS: 50.0000 mg | ORAL_TABLET | Freq: Three times a day (TID) | ORAL | 0 refills | Status: DC | PRN

## 2017-11-03 NOTE — Telephone Encounter (Signed)
rx faxed

## 2017-11-03 NOTE — Telephone Encounter (Signed)
I have printed her refill but next month will be a the 6 month mark so she will need an appt

## 2017-11-03 NOTE — Telephone Encounter (Signed)
Pt is requesting refill on Tramadol.   Last OV: 05/30/2017 Last Fill: 09/25/2017 #60 and 0RF UDS: 05/30/2017 Low risk  Please advise.

## 2017-12-07 ENCOUNTER — Other Ambulatory Visit: Payer: Self-pay | Admitting: Family Medicine

## 2017-12-08 ENCOUNTER — Other Ambulatory Visit: Payer: Self-pay

## 2017-12-08 ENCOUNTER — Other Ambulatory Visit: Payer: Self-pay | Admitting: Family Medicine

## 2017-12-08 MED ORDER — TRAMADOL HCL 50 MG PO TABS: 50.0000 mg | ORAL_TABLET | Freq: Three times a day (TID) | ORAL | 0 refills | Status: DC | PRN

## 2017-12-08 NOTE — Telephone Encounter (Signed)
OK to refill today but no further refills til seen. Has to have an appt every 6 months to keep prescription active

## 2017-12-08 NOTE — Telephone Encounter (Signed)
I eprescribed just let her know about refill protocol

## 2017-12-08 NOTE — Telephone Encounter (Signed)
Requesting:tramadol Contract:yes UDS:low risk next screen 05/31/18 Last OV:05/30/17 Next OV:not scheduled  Last Refill:11/03/17   #[id number]   Please advise  '

## 2017-12-11 ENCOUNTER — Other Ambulatory Visit: Payer: Self-pay | Admitting: Family Medicine

## 2017-12-16 ENCOUNTER — Other Ambulatory Visit: Payer: Self-pay | Admitting: Family Medicine

## 2017-12-22 ENCOUNTER — Other Ambulatory Visit: Payer: Self-pay | Admitting: Family Medicine

## 2017-12-22 NOTE — Telephone Encounter (Signed)
rx has already been taking care of

## 2018-01-03 ENCOUNTER — Other Ambulatory Visit: Payer: Self-pay | Admitting: Family Medicine

## 2018-01-03 DIAGNOSIS — Z79899 Other long term (current) drug therapy: Secondary | ICD-10-CM

## 2018-01-04 NOTE — Telephone Encounter (Signed)
Patient notified  rx at the front

## 2018-01-04 NOTE — Telephone Encounter (Signed)
Requesting: tramadol Contract: 05/30/17 UDS: 05/30/17 Last Visit: 09/29/17 Next Visit: none Last Refill: 12/08/17  Please Advise

## 2018-01-04 NOTE — Telephone Encounter (Signed)
She can have one refill but it has been 6 months since I saw

## 2018-01-04 NOTE — Telephone Encounter (Signed)
Can have a refill but has not been seen in 6 months. Needs an appt for further refills and needs UDS

## 2018-01-08 ENCOUNTER — Other Ambulatory Visit: Payer: Self-pay | Admitting: Family Medicine

## 2018-03-08 ENCOUNTER — Other Ambulatory Visit: Payer: Self-pay | Admitting: Family Medicine

## 2018-03-08 NOTE — Telephone Encounter (Signed)
Copied from [place] [phone]. Topic: Quick Communication - Rx Refill/Question >> Mar 08, 2018 10:30 AM Robina Ade, Helene Kelp D wrote: Medication:traMADol Veatrice Bourbon) 50 MG tablet Has the patient contacted their pharmacy? Yes (Agent: If no, request that the patient contact the pharmacy for the refill.) Preferred Pharmacy (with phone number or street name): [id number] [place] [address] Agent: Please be advised that RX refills may take up to 3 business days. We ask that you follow-up with your pharmacy.

## 2018-03-08 NOTE — Telephone Encounter (Signed)
LOV  05/30/17 Dr. Charlett Blake [facility]. [address]

## 2018-03-08 NOTE — Telephone Encounter (Signed)
Rx denied. Last OV 05/2017- new federal guidelines, must be seen within the last 3 months.

## 2018-03-14 ENCOUNTER — Other Ambulatory Visit: Payer: Self-pay | Admitting: Family Medicine

## 2018-03-15 ENCOUNTER — Telehealth: Payer: Self-pay | Admitting: Family Medicine

## 2018-03-15 NOTE — Telephone Encounter (Signed)
Requesting: ULTRAM 50 MG Contract:05/30/17 UDS:05/30/17 Last OV:05/30/17 Next OV:- Last Refill: 01/04/18 #60   Please advise

## 2018-03-15 NOTE — Telephone Encounter (Signed)
I did a refill but no more until she has an updated UDS and an office visit, she has to be seen every 6 months to keep this med active

## 2018-03-15 NOTE — Telephone Encounter (Unsigned)
Copied from [place] [id number]. Topic: Quick Communication - Rx Refill/Question >> Mar 15, 2018 11:37 AM Carolyn Stare wrote: Medication    traMADol (ULTRAM) 50 MG tablet  Has the patient contacted their pharmacy yes    [facility]   Agent: Please be advised that RX refills may take up to 3 business days. We ask that you follow-up with your pharmacy.

## 2018-03-24 ENCOUNTER — Other Ambulatory Visit: Payer: Self-pay | Admitting: Family Medicine

## 2018-03-26 NOTE — Telephone Encounter (Signed)
Copied from [place] [id number]. Topic: Quick Communication - Rx Refill/Question >> Mar 26, 2018  1:40 PM Waylan Rocher, Lumin L wrote: Medication: escitalopram (LEXAPRO) 10 MG tablet (out of medication) Has the patient contacted their pharmacy? Yes.   (Agent: If no, request that the patient contact the pharmacy for the refill.) Preferred Pharmacy (with phone number or street name): [id number] [place], [address] [place] [address] Phone: [phone] Fax: [phone] Agent: Please be advised that RX refills may take up to 3 business days. We ask that you follow-up with your pharmacy.   Patient says she can't stop taking them and would like some called in immediately.

## 2018-04-18 ENCOUNTER — Encounter: Payer: Self-pay | Admitting: Gastroenterology

## 2018-05-01 ENCOUNTER — Emergency Department (HOSPITAL_COMMUNITY)
Admission: EM | Admit: 2018-05-01 | Discharge: 2018-05-01 | Disposition: A | Discharge status: Home / Self Care | Payer: 59 | Attending: Emergency Medicine | Admitting: Emergency Medicine

## 2018-05-01 ENCOUNTER — Emergency Department (HOSPITAL_COMMUNITY): Payer: 59

## 2018-05-01 ENCOUNTER — Encounter (HOSPITAL_COMMUNITY): Payer: Self-pay

## 2018-05-01 DIAGNOSIS — Y999 Unspecified external cause status: Secondary | ICD-10-CM | POA: Diagnosis not present

## 2018-05-01 DIAGNOSIS — S52502A Unspecified fracture of the lower end of left radius, initial encounter for closed fracture: Secondary | ICD-10-CM | POA: Diagnosis not present

## 2018-05-01 DIAGNOSIS — Z87891 Personal history of nicotine dependence: Secondary | ICD-10-CM | POA: Diagnosis not present

## 2018-05-01 DIAGNOSIS — Z853 Personal history of malignant neoplasm of breast: Secondary | ICD-10-CM | POA: Insufficient documentation

## 2018-05-01 DIAGNOSIS — S52602A Unspecified fracture of lower end of left ulna, initial encounter for closed fracture: Secondary | ICD-10-CM | POA: Insufficient documentation

## 2018-05-01 DIAGNOSIS — Y92008 Other place in unspecified non-institutional (private) residence as the place of occurrence of the external cause: Secondary | ICD-10-CM | POA: Insufficient documentation

## 2018-05-01 DIAGNOSIS — W19XXXA Unspecified fall, initial encounter: Secondary | ICD-10-CM

## 2018-05-01 DIAGNOSIS — W108XXA Fall (on) (from) other stairs and steps, initial encounter: Secondary | ICD-10-CM | POA: Insufficient documentation

## 2018-05-01 DIAGNOSIS — Z79899 Other long term (current) drug therapy: Secondary | ICD-10-CM | POA: Diagnosis not present

## 2018-05-01 DIAGNOSIS — Z23 Encounter for immunization: Secondary | ICD-10-CM | POA: Diagnosis not present

## 2018-05-01 DIAGNOSIS — Y9389 Activity, other specified: Secondary | ICD-10-CM | POA: Insufficient documentation

## 2018-05-01 DIAGNOSIS — S6982XA Other specified injuries of left wrist, hand and finger(s), initial encounter: Secondary | ICD-10-CM | POA: Diagnosis present

## 2018-05-01 MED ORDER — TETANUS-DIPHTH-ACELL PERTUSSIS 5-2.5-18.5 LF-MCG/0.5 IM SUSP
0.5000 mL | Freq: Once | INTRAMUSCULAR | Status: AC
  Administered 2018-05-01: 0.5 mL via INTRAMUSCULAR
  Filled 2018-05-01: qty 0.5

## 2018-05-01 MED ORDER — TRAMADOL HCL 50 MG PO TABS: 50.0000 mg | ORAL_TABLET | Freq: Four times a day (QID) | ORAL | 0 refills | Status: DC | PRN

## 2018-05-01 MED ORDER — HYDROCODONE-ACETAMINOPHEN 5-325 MG PO TABS
1.0000 | ORAL_TABLET | Freq: Once | ORAL | Status: AC
  Administered 2018-05-01: 1 via ORAL
  Filled 2018-05-01: qty 1

## 2018-05-01 NOTE — ED Provider Notes (Signed)
[facility] DEPT Provider Note   CSN: [id number] Arrival date & time: 05/01/18  1534     History   Chief Complaint Chief Complaint  Patient presents with  . Fall  . Wrist Pain    HPI [name] is a 67 y.o. female.  Patient states she fell and hurt her left hand.  The history is provided by the patient. No language interpreter was used.  Fall  This is a new problem. The current episode started less than 1 hour ago. The problem occurs rarely. The problem has been resolved. Pertinent negatives include no chest pain, no abdominal pain and no headaches. Exacerbated by: Movement of left arm. Nothing relieves the symptoms. She has tried nothing for the symptoms. The treatment provided mild relief.    Past Medical History:  Diagnosis Date  . Cancer [facility]) [id number]   Breast  . Chicken pox   . Colon polyps   . Depression with anxiety 09/07/2012  . Dyslipidemia   . Elevated BP 09/09/2012  . H/O tobacco use, presenting hazards to health 05/05/2011  . Left hip pain 05/04/2011  . Osteoporosis 2018   T score -2.2.  Prior DEXA 2014 T score -2.5  . Preventative health care 09/09/2012  . Tobacco abuse 05/05/2011    Patient Active Problem List   Diagnosis Date Noted  . Anxiety and depression 04/10/2016  . Ganglion cyst 04/10/2016  . 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 Hypokalemia 08/19/2013  . Elevated BP 09/09/2012  . Preventative health care 09/09/2012  . 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 Osteoporosis 02/07/2012  . H/O tobacco use, presenting hazards to health 05/05/2011  . Left hip pain 05/04/2011  . Radiculopathy of leg 05/04/2011  . Breast cancer, right breast ([place]) 05/04/2011  . Colon polyps   . Dyslipidemia     Past Surgical History:  Procedure Laterality Date  . APPENDECTOMY  2010  . BREAST LUMPECTOMY  1998   right  . BREAST SURGERY  07/08/11   right mastectomy+axlnd, reconstruction,T1cN1a,ERPR+,HER2-  . BREAST SURGERY   march,2013   breast implant deflated     OB History    Gravida  2   Para  2   Term      Preterm      AB      Living  2     SAB      TAB      Ectopic      Multiple      Live Births               Home Medications    Prior to Admission medications   Medication Sig Start Date End Date Taking? Authorizing Provider  acetaminophen (TYLENOL) 500 MG tablet Take 500 mg by mouth daily as needed for moderate pain.   Yes [provider]  escitalopram (LEXAPRO) 10 MG tablet TAKE 1 TABLET BY MOUTH ONCE DAILY 03/26/18  Yes Mosie Lukes, MD  gabapentin (NEURONTIN) 300 MG capsule TAKE ONE CAPSULE BY MOUTH AT BEDTIME 09/01/16  Yes Magrinat, Virgie Dad, MD  ibuprofen (ADVIL,MOTRIN) 400 MG tablet Take 400 mg by mouth every 6 (six) hours as needed for pain.   Yes [provider]  ketoconazole (NIZORAL) 2 % cream Apply 1 application topically daily. Patient not taking: Reported on 05/01/2018 09/01/16   Magrinat, Virgie Dad, MD  traMADol (ULTRAM) 50 MG tablet Take 1 tablet (50 mg total) by mouth every 6 (six) hours as needed. 05/01/18  Milton Ferguson, MD    Family History Family History  Problem Relation Age of Onset  . Diabetes Mother        type 2  . Obesity Mother   . Cancer Father        ?  Marland Kitchen Heart block Sister   . Heart disease Sister        s/p CABG  . Heart disease Brother        s/p CABG  . Colon cancer Neg Hx   . Esophageal cancer Neg Hx   . Rectal cancer Neg Hx   . Stomach cancer Neg Hx     Social History Social History   Tobacco Use  . Smoking status: Former Research scientist (life sciences)  . Smokeless tobacco: Never Used  Substance Use Topics  . Alcohol use: Yes    Alcohol/week: 0.0 oz    Comment: occasionally  . Drug use: No     Allergies   Patient has no known allergies.   Review of Systems Review of Systems  Constitutional: Negative for appetite change and fatigue.  HENT: Negative for congestion, ear discharge and sinus pressure.   Eyes: Negative for  discharge.  Respiratory: Negative for cough.   Cardiovascular: Negative for chest pain.  Gastrointestinal: Negative for abdominal pain and diarrhea.  Genitourinary: Negative for frequency and hematuria.  Musculoskeletal: Negative for back pain.       Wrist pain  Skin: Negative for rash.  Neurological: Negative for seizures and headaches.  Psychiatric/Behavioral: Negative for hallucinations.     Physical Exam Updated Vital Signs BP (!) 150/81   Pulse 77   Temp 97.9 F (36.6 C)   Resp 18   SpO2 99%   Physical Exam  Constitutional: She is oriented to person, place, and time. She appears well-developed.  HENT:  Head: Normocephalic.  Eyes: Conjunctivae are normal.  Neck: No tracheal deviation present.  Cardiovascular:  No murmur heard. Musculoskeletal: Normal range of motion.  Patient has swollen tender left wrist with good pulses neurovascular exam is normal.  Neurological: She is oriented to person, place, and time.  Skin: Skin is warm.  Psychiatric: She has a normal mood and affect.     ED Treatments / Results  Labs (all labs ordered are listed, but only abnormal results are displayed) Labs Reviewed - No data to display  EKG None  Radiology Dg Wrist Complete Left  Result Date: 05/01/2018 CLINICAL DATA:  Acute LEFT wrist pain following fall. Initial encounter. EXAM: LEFT WRIST - COMPLETE 3+ VIEW COMPARISON:  None. FINDINGS: A fracture of the distal radial metaphysis is noted with approximately 3 mm dorsal and ulnar displacement. There is possible extension to the radiocarpal joint. An ulnar styloid fracture is identified. No subluxation or dislocation identified. IMPRESSION: 1. Distal radial and ulnar styloid fractures as described. Possible extension of distal radial fracture to the radiocarpal joint. No dislocation. Electronically Signed   By: Margarette Canada M.D.   On: 05/01/2018 16:35    Procedures Procedures (including critical care time)  Medications Ordered in  ED Medications  Tdap (BOOSTRIX) injection 0.5 mL (0.5 mLs Intramuscular Given 05/01/18 1634)  HYDROcodone-acetaminophen (NORCO/VICODIN) 5-325 MG per tablet 1 tablet (1 tablet Oral Given 05/01/18 1727)     Initial Impression / Assessment and Plan / ED Course  I have reviewed the triage vital signs and the nursing notes.  Pertinent labs & imaging results that were available during my care of the patient were reviewed by me and considered in my medical decision  making (see chart for details).     Distal ulna and radius fracture with minimal displacement.  She was placed in a sugar tong splint.  I spoke with Dr. Amedeo Plenty and he would like to see the patient tomorrow at 1115 in his office  Final Clinical Impressions(s) / ED Diagnoses   Final diagnoses:  Fall, initial encounter    ED Discharge Orders        Ordered    traMADol (ULTRAM) 50 MG tablet  Every 6 hours PRN     05/01/18 1836       Milton Ferguson, MD 05/01/18 1843

## 2018-05-01 NOTE — Discharge Instructions (Addendum)
Follow up with Dr. Amedeo Plenty tomorrow at 11:15 am

## 2018-05-01 NOTE — ED Notes (Signed)
Bed: [facility] Expected date:  Expected time:  Means of arrival:  Comments: EMS-fall 

## 2018-05-01 NOTE — ED Triage Notes (Signed)
Patient arrived via [facility] from home. Patient was waking into her house on her back porch and the steps broke. Patient fail and landed on left wrist. A/O. No LOC. No neck or back pain. No bed thinner per ems. No pain meds given per ems.

## 2018-06-22 ENCOUNTER — Other Ambulatory Visit: Payer: Self-pay | Admitting: Family Medicine

## 2018-10-02 ENCOUNTER — Encounter: Payer: Self-pay | Admitting: Gynecology

## 2018-10-02 LAB — HM MAMMOGRAPHY

## 2018-10-16 ENCOUNTER — Encounter: Payer: Self-pay | Admitting: Family Medicine

## 2018-10-16 ENCOUNTER — Telehealth: Payer: Self-pay | Admitting: Family Medicine

## 2018-10-16 ENCOUNTER — Ambulatory Visit (INDEPENDENT_AMBULATORY_CARE_PROVIDER_SITE_OTHER): Payer: 59 | Admitting: Family Medicine

## 2018-10-16 VITALS — BP 102/64 | HR 78 | Temp 98.1°F | Resp 18 | Ht 66.0 in | Wt 154.0 lb

## 2018-10-16 DIAGNOSIS — F419 Anxiety disorder, unspecified: Secondary | ICD-10-CM

## 2018-10-16 DIAGNOSIS — M810 Age-related osteoporosis without current pathological fracture: Secondary | ICD-10-CM | POA: Diagnosis not present

## 2018-10-16 DIAGNOSIS — M5410 Radiculopathy, site unspecified: Secondary | ICD-10-CM

## 2018-10-16 DIAGNOSIS — F32A Depression, unspecified: Secondary | ICD-10-CM

## 2018-10-16 DIAGNOSIS — M25552 Pain in left hip: Secondary | ICD-10-CM

## 2018-10-16 DIAGNOSIS — E876 Hypokalemia: Secondary | ICD-10-CM

## 2018-10-16 DIAGNOSIS — F329 Major depressive disorder, single episode, unspecified: Secondary | ICD-10-CM

## 2018-10-16 DIAGNOSIS — Z79899 Other long term (current) drug therapy: Secondary | ICD-10-CM | POA: Diagnosis not present

## 2018-10-16 DIAGNOSIS — E785 Hyperlipidemia, unspecified: Secondary | ICD-10-CM

## 2018-10-16 MED ORDER — TRAMADOL HCL 50 MG PO TABS: 50.0000 mg | ORAL_TABLET | Freq: Two times a day (BID) | ORAL | 0 refills | Status: DC | PRN

## 2018-10-16 MED ORDER — ESCITALOPRAM OXALATE 10 MG PO TABS: 10.0000 mg | ORAL_TABLET | Freq: Every day | ORAL | 1 refills | Status: DC

## 2018-10-16 NOTE — Assessment & Plan Note (Signed)
Using Tramadol prn with good results. Will refill

## 2018-10-16 NOTE — Patient Instructions (Addendum)
Need a copy of all immunizaitons. Pneumonia and shingles shots   Cholesterol Cholesterol is a white, waxy, fat-like substance that is needed by the human body in small amounts. The liver makes all the cholesterol we need. Cholesterol is carried from the liver by the blood through the blood vessels. Deposits of cholesterol (plaques) may build up on blood vessel (artery) walls. Plaques make the arteries narrower and stiffer. Cholesterol plaques increase the risk for heart attack and stroke. You cannot feel your cholesterol level even if it is very high. The only way to know that it is high is to have a blood test. Once you know your cholesterol levels, you should keep a record of the test results. Work with your health care provider to keep your levels in the desired range. What do the results mean?  Total cholesterol is a rough measure of all the cholesterol in your blood.  LDL (low-density lipoprotein) is the "bad" cholesterol. This is the type that causes plaque to build up on the artery walls. You want this level to be low.  HDL (high-density lipoprotein) is the "good" cholesterol because it cleans the arteries and carries the LDL away. You want this level to be high.  Triglycerides are fat that the body can either burn for energy or store. High levels are closely linked to heart disease. What are the desired levels of cholesterol?  Total cholesterol below 200.  LDL below 100 for people who are at risk, below 70 for people at very high risk.  HDL above 40 is good. A level of 60 or higher is considered to be protective against heart disease.  Triglycerides below 150. How can I lower my cholesterol? Diet Follow your diet program as told by your health care provider.  Choose fish or white meat chicken and [place], roasted or baked. Limit fatty cuts of red meat, fried foods, and processed meats, such as sausage and lunch meats.  Eat lots of fresh fruits and vegetables.  Choose whole  grains, beans, pasta, potatoes, and cereals.  Choose olive oil, corn oil, or canola oil, and use only small amounts.  Avoid butter, mayonnaise, shortening, or palm kernel oils.  Avoid foods with trans fats.  Drink skim or nonfat milk and eat low-fat or nonfat yogurt and cheeses. Avoid whole milk, cream, ice cream, egg yolks, and full-fat cheeses.  Healthier desserts include angel food cake, ginger snaps, animal crackers, hard candy, popsicles, and low-fat or nonfat frozen yogurt. Avoid pastries, cakes, pies, and cookies.  Exercise  Follow your exercise program as told by your health care provider. A regular program: ? Helps to decrease LDL and raise HDL. ? Helps with weight control.  Do things that increase your activity level, such as gardening, walking, and taking the stairs.  Ask your health care provider about ways that you can be more active in your daily life.  Medicine  Take over-the-counter and prescription medicines only as told by your health care provider. ? Medicine may be prescribed by your health care provider to help lower cholesterol and decrease the risk for heart disease. This is usually done if diet and exercise have failed to bring down cholesterol levels. ? If you have several risk factors, you may need medicine even if your levels are normal.  This information is not intended to replace advice given to you by your health care provider. Make sure you discuss any questions you have with your health care provider. Document Released: 08/09/2001 Document Revised: 06/11/2016 Document  Reviewed: 05/14/2016 Elsevier Interactive Patient Education  [employer].

## 2018-10-16 NOTE — Telephone Encounter (Signed)
Per Dr. Charlett Blake  Please start the Prolia PA   Thanks

## 2018-10-16 NOTE — Assessment & Plan Note (Signed)
Tramadol prn for leg pain

## 2018-10-16 NOTE — Assessment & Plan Note (Signed)
Check cmp 

## 2018-10-16 NOTE — Assessment & Plan Note (Signed)
Encouraged heart healthy diet, increase exercise, avoid trans fats, consider a krill oil cap daily 

## 2018-10-16 NOTE — Assessment & Plan Note (Signed)
Encouraged to get adequate exercise, calcium and vitamin d intake 

## 2018-10-17 ENCOUNTER — Telehealth: Payer: Self-pay | Admitting: Family Medicine

## 2018-10-17 LAB — COMPREHENSIVE METABOLIC PANEL
ALT: 14 U/L (ref 0–35)
AST: 16 U/L (ref 0–37)
Albumin: 4.2 g/dL (ref 3.5–5.2)
Alkaline Phosphatase: 60 U/L (ref 39–117)
BUN: 23 mg/dL (ref 6–23)
CO2: 28 mEq/L (ref 19–32)
Calcium: 9.1 mg/dL (ref 8.4–10.5)
Chloride: 103 mEq/L (ref 96–112)
Creatinine, Ser: 0.65 mg/dL (ref 0.40–1.20)
GFR: 96.47 mL/min (ref >60.00)
GLUCOSE: 83 mg/dL (ref 70–99)
POTASSIUM: 4.2 meq/L (ref 3.5–5.1)
SODIUM: 139 meq/L (ref 135–145)
TOTAL PROTEIN: 6.9 g/dL (ref 6.0–8.3)
Total Bilirubin: 0.4 mg/dL (ref 0.2–1.2)

## 2018-10-17 LAB — VITAMIN D 25 HYDROXY (VIT D DEFICIENCY, FRACTURES): VITD: 19.24 ng/mL — ABNORMAL LOW (ref 30.00–100.00)

## 2018-10-17 NOTE — Telephone Encounter (Signed)
Copied from [place] [phone]. Topic: Quick Communication - Rx Refill/Question >> Oct 17, 2018  8:30 AM Margot Ables wrote: Medication: xanax - pt states medication was discussed with Dr. Charlett Blake at [facility] 10/16/18 but pharmacy did not receive a prescription. Pt states if we need to speak to her to call her cell phone # today [phone].  Has the patient contacted their pharmacy? Yes - no RX Preferred Pharmacy (with phone number or street name): [id number] [place], [place] [phone] (Phone) [phone] (Fax)

## 2018-10-18 MED ORDER — VITAMIN D (ERGOCALCIFEROL) 1.25 MG (50000 UNIT) PO CAPS: 50000.0000 [IU] | ORAL_CAPSULE | ORAL | 4 refills | Status: DC

## 2018-10-18 NOTE — Telephone Encounter (Signed)
Verification process started; awaiting determination.

## 2018-10-19 NOTE — Telephone Encounter (Signed)
Noted  

## 2018-10-20 LAB — PAIN MGMT, PROFILE 8 W/CONF, U
6 Acetylmorphine: NEGATIVE ng/mL (ref <10)
AMPHETAMINES: NEGATIVE ng/mL (ref <500)
Alcohol Metabolites: NEGATIVE ng/mL (ref <500)
BUPRENORPHINE, URINE: NEGATIVE ng/mL (ref <5)
Benzodiazepines: NEGATIVE ng/mL (ref <100)
CODEINE: 95 ng/mL — AB (ref <50)
CREATININE: 150.5 mg/dL
Cocaine Metabolite: NEGATIVE ng/mL (ref <150)
HYDROCODONE: NEGATIVE ng/mL (ref <50)
HYDROMORPHONE: NEGATIVE ng/mL (ref <50)
MARIJUANA METABOLITE: NEGATIVE ng/mL (ref <20)
MDMA: NEGATIVE ng/mL (ref <500)
Morphine: NEGATIVE ng/mL (ref <50)
Norhydrocodone: NEGATIVE ng/mL (ref <50)
OPIATES: POSITIVE ng/mL — AB (ref <100)
OXIDANT: NEGATIVE ug/mL (ref <200)
OXYCODONE: NEGATIVE ng/mL (ref <100)
pH: 6.28 (ref 4.5–9.0)

## 2018-10-21 NOTE — Progress Notes (Signed)
Subjective:    Patient ID: [name], female    DOB: 09-Jul-1951, 67 y.o.   MRN: [id number]  Chief Complaint  Patient presents with  . Follow-up    HPI Patient is in today for follow up. She is doing well but has had a rough year she fell and fractured her left wrist very badly. She required pins and a rod placed by Dr Amedeo Plenty. She is slowly improving. She has been caring for her ailing husband as well. No recent febrile illness or hospitalizations. Denies CP/palp/SOB/HA/congestion/fevers/GI or GU c/o. Taking meds as prescribed  Past Medical History:  Diagnosis Date  . Cancer [facility]) [id number]   Breast  . Chicken pox   . Colon polyps   . Depression with anxiety 09/07/2012  . Dyslipidemia   . Elevated BP 09/09/2012  . H/O tobacco use, presenting hazards to health 05/05/2011  . Left hip pain 05/04/2011  . Osteoporosis 2018   T score -2.2.  Prior DEXA 2014 T score -2.5  . Preventative health care 09/09/2012  . Tobacco abuse 05/05/2011    Past Surgical History:  Procedure Laterality Date  . APPENDECTOMY  2010  . BREAST LUMPECTOMY  1998   right  . BREAST SURGERY  07/08/11   right mastectomy+axlnd, reconstruction,T1cN1a,ERPR+,HER2-  . BREAST SURGERY  march,2013   breast implant deflated    Family History  Problem Relation Age of Onset  . Diabetes Mother        type 2  . Obesity Mother   . Cancer Father        ?  Marland Kitchen Heart block Sister   . Heart disease Sister        s/p CABG  . Heart disease Brother        s/p CABG  . Colon cancer Neg Hx   . Esophageal cancer Neg Hx   . Rectal cancer Neg Hx   . Stomach cancer Neg Hx     Social History   Socioeconomic History  . Marital status: Married    Spouse name: Not on file  . Number of children: Not on file  . Years of education: Not on file  . Highest education level: Not on file  Occupational History  . Not on file  Social Needs  . Financial resource strain: Not on file  . Food insecurity:    Worry: Not on file    Inability: Not on file  . Transportation needs:    Medical: Not on file    Non-medical: Not on file  Tobacco Use  . Smoking status: Former Research scientist (life sciences)  . Smokeless tobacco: Never Used  Substance and Sexual Activity  . Alcohol use: Yes    Alcohol/week: 0.0 standard drinks    Comment: occasionally  . Drug use: No  . Sexual activity: Not Currently    Comment: 1st intercourse 48 yo-5 partners  Lifestyle  . Physical activity:    Days per week: Not on file    Minutes per session: Not on file  . Stress: Not on file  Relationships  . Social connections:    Talks on phone: Not on file    Gets together: Not on file    Attends religious service: Not on file    Active member of club or organization: Not on file    Attends meetings of clubs or organizations: Not on file    Relationship status: Not on file  . Intimate partner violence:    Fear of current or ex partner: Not  on file    Emotionally abused: Not on file    Physically abused: Not on file    Forced sexual activity: Not on file  Other Topics Concern  . Not on file  Social History Narrative  . Not on file    Outpatient Medications Prior to Visit  Medication Sig Dispense Refill  . acetaminophen (TYLENOL) 500 MG tablet Take 500 mg by mouth daily as needed for moderate pain.    Marland Kitchen gabapentin (NEURONTIN) 300 MG capsule TAKE ONE CAPSULE BY MOUTH AT BEDTIME 30 capsule 4  . ibuprofen (ADVIL,MOTRIN) 400 MG tablet Take 400 mg by mouth every 6 (six) hours as needed for pain.    Marland Kitchen ketoconazole (NIZORAL) 2 % cream Apply 1 application topically daily. 15 g 0  . escitalopram (LEXAPRO) 10 MG tablet TAKE 1 TABLET BY MOUTH ONCE DAILY 90 tablet 1  . traMADol (ULTRAM) 50 MG tablet Take 1 tablet (50 mg total) by mouth every 6 (six) hours as needed. 20 tablet 0   No facility-administered medications prior to visit.     No Known Allergies  Review of Systems  Constitutional: Negative for fever and malaise/fatigue.  HENT: Negative for congestion.    Eyes: Negative for blurred vision.  Respiratory: Negative for shortness of breath.   Cardiovascular: Negative for chest pain, palpitations and leg swelling.  Gastrointestinal: Negative for abdominal pain, blood in stool and nausea.  Genitourinary: Negative for dysuria and frequency.  Musculoskeletal: Positive for joint pain. Negative for falls.  Skin: Negative for rash.  Neurological: Negative for dizziness, loss of consciousness and headaches.  Endo/Heme/Allergies: Negative for environmental allergies.  Psychiatric/Behavioral: Positive for depression. The patient is nervous/anxious.        Objective:    Physical Exam  Constitutional: She is oriented to person, place, and time. She appears well-developed and well-nourished. No distress.  HENT:  Head: Normocephalic and atraumatic.  Nose: Nose normal.  Eyes: Right eye exhibits no discharge. Left eye exhibits no discharge.  Neck: Normal range of motion. Neck supple.  Cardiovascular: Normal rate and regular rhythm.  No murmur heard. Pulmonary/Chest: Effort normal and breath sounds normal.  Abdominal: Soft. Bowel sounds are normal. There is no tenderness.  Musculoskeletal: She exhibits no edema.  Neurological: She is alert and oriented to person, place, and time.  Skin: Skin is warm and dry.  Psychiatric: She has a normal mood and affect.  Nursing note and vitals reviewed.   BP 102/64 (BP Location: Left Arm, Patient Position: Sitting, Cuff Size: Normal)   Pulse 78   Temp 98.1 F (36.7 C) (Oral)   Resp 18   Ht 5' 6"  (1.676 m)   Wt 154 lb (69.9 kg)   SpO2 97%   BMI 24.86 kg/m  Wt Readings from Last 3 Encounters:  10/16/18 154 lb (69.9 kg)  09/29/17 157 lb (71.2 kg)  05/30/17 157 lb (71.2 kg)     Lab Results  Component Value Date   WBC 7.7 09/01/2016   HGB 13.9 09/01/2016   HCT 42.9 09/01/2016   PLT 295 09/01/2016   GLUCOSE 83 10/16/2018   CHOL 193 02/02/2016   TRIG 113 02/02/2016   HDL 43 02/02/2016   LDLDIRECT  149.1 05/10/2011   LDLCALC 127 02/02/2016   ALT 14 10/16/2018   AST 16 10/16/2018   NA 139 10/16/2018   K 4.2 10/16/2018   CL 103 10/16/2018   CREATININE 0.65 10/16/2018   BUN 23 10/16/2018   CO2 28 10/16/2018   TSH 1.76  02/02/2016   HGBA1C 5.6 02/02/2016    Lab Results  Component Value Date   TSH 1.76 02/02/2016   Lab Results  Component Value Date   WBC 7.7 09/01/2016   HGB 13.9 09/01/2016   HCT 42.9 09/01/2016   MCV 90.0 09/01/2016   PLT 295 09/01/2016   Lab Results  Component Value Date   NA 139 10/16/2018   K 4.2 10/16/2018   CHLORIDE 106 09/01/2016   CO2 28 10/16/2018   GLUCOSE 83 10/16/2018   BUN 23 10/16/2018   CREATININE 0.65 10/16/2018   BILITOT 0.4 10/16/2018   ALKPHOS 60 10/16/2018   AST 16 10/16/2018   ALT 14 10/16/2018   PROT 6.9 10/16/2018   ALBUMIN 4.2 10/16/2018   CALCIUM 9.1 10/16/2018   ANIONGAP 9 09/01/2016   EGFR >90 09/01/2016   GFR 96.47 10/16/2018   Lab Results  Component Value Date   CHOL 193 02/02/2016   Lab Results  Component Value Date   HDL 43 02/02/2016   Lab Results  Component Value Date   LDLCALC 127 02/02/2016   Lab Results  Component Value Date   TRIG 113 02/02/2016   Lab Results  Component Value Date   CHOLHDL 5 10/09/2014   Lab Results  Component Value Date   HGBA1C 5.6 02/02/2016       Assessment & Plan:   Problem List Items Addressed This Visit    Left hip pain    Using Tramadol prn with good results. Will refill      Radiculopathy of leg    Tramadol prn for leg pain      Relevant Medications   escitalopram (LEXAPRO) 10 MG tablet   Dyslipidemia    Encouraged heart healthy diet, increase exercise, avoid trans fats, consider a krill oil cap daily      Osteoporosis    Encouraged to get adequate exercise, calcium and vitamin d intake      Relevant Medications   Vitamin D, Ergocalciferol, (DRISDOL) 1.25 MG (50000 UT) CAPS capsule   Other Relevant Orders   Comprehensive metabolic panel  (Completed)   VITAMIN D 25 Hydroxy (Vit-D Deficiency, Fractures) (Completed)   Hypokalemia    Check cmp      Anxiety and depression    Doing well on Lexapro despite being under a great deal of stress we will consider increasing to 20 mg as needed.       Relevant Medications   escitalopram (LEXAPRO) 10 MG tablet    Other Visit Diagnoses    High risk medication use    -  Primary   Relevant Orders   Pain Mgmt, Profile 8 w/Conf, U (Completed)      I have changed Wilene M. Bruni's escitalopram. I am also having her start on Vitamin D (Ergocalciferol). Additionally, I am having her maintain her ibuprofen, gabapentin, ketoconazole, acetaminophen, and traMADol.  Meds ordered this encounter  Medications  . escitalopram (LEXAPRO) 10 MG tablet    Sig: Take 1 tablet (10 mg total) by mouth daily.    Dispense:  90 tablet    Refill:  1  . DISCONTD: traMADol (ULTRAM) 50 MG tablet    Sig: Take 1 tablet (50 mg total) by mouth 2 (two) times daily as needed.    Dispense:  60 tablet    Refill:  0  . traMADol (ULTRAM) 50 MG tablet    Sig: Take 1 tablet (50 mg total) by mouth 2 (two) times daily as needed.    Dispense:  60 tablet    Refill:  0  . Vitamin D, Ergocalciferol, (DRISDOL) 1.25 MG (50000 UT) CAPS capsule    Sig: Take 1 capsule (50,000 Units total) by mouth every 7 (seven) days.    Dispense:  4 capsule    Refill:  4     Penni Homans, MD

## 2018-10-21 NOTE — Assessment & Plan Note (Signed)
Doing well on Lexapro despite being under a great deal of stress we will consider increasing to 20 mg as needed.

## 2018-10-22 ENCOUNTER — Other Ambulatory Visit: Payer: Self-pay | Admitting: Family Medicine

## 2018-10-22 ENCOUNTER — Encounter: Payer: 59 | Admitting: Gynecology

## 2018-10-22 MED ORDER — ALPRAZOLAM 0.25 MG PO TABS: 0.2500 mg | ORAL_TABLET | Freq: Two times a day (BID) | ORAL | 1 refills | Status: DC | PRN

## 2018-10-22 NOTE — Telephone Encounter (Signed)
Please advise on rx for Xanax

## 2018-10-22 NOTE — Telephone Encounter (Signed)
I have sent it to her [place]

## 2018-11-07 ENCOUNTER — Encounter: Payer: 59 | Admitting: Gynecology

## 2018-11-08 ENCOUNTER — Encounter: Payer: 59 | Admitting: Gynecology

## 2018-11-13 NOTE — Telephone Encounter (Signed)
Prolia benefits verified. No PA required. Pt. May owe approximately $255 OOP. Author phoned pt. To set up [place] appointment for first prolia injection. Pt. Stated she was busy but would call back this week to schedule.

## 2018-11-27 ENCOUNTER — Encounter: Payer: Self-pay | Admitting: Family Medicine

## 2019-03-12 ENCOUNTER — Other Ambulatory Visit: Payer: Self-pay | Admitting: Family Medicine

## 2019-03-21 ENCOUNTER — Encounter: Payer: Self-pay | Admitting: Gynecology

## 2019-03-21 ENCOUNTER — Other Ambulatory Visit: Payer: Self-pay

## 2019-03-21 ENCOUNTER — Ambulatory Visit (INDEPENDENT_AMBULATORY_CARE_PROVIDER_SITE_OTHER): Payer: 59 | Admitting: Gynecology

## 2019-03-21 VITALS — BP 124/78 | Ht 65.0 in | Wt 159.0 lb

## 2019-03-21 DIAGNOSIS — Z853 Personal history of malignant neoplasm of breast: Secondary | ICD-10-CM

## 2019-03-21 DIAGNOSIS — M810 Age-related osteoporosis without current pathological fracture: Secondary | ICD-10-CM | POA: Diagnosis not present

## 2019-03-21 DIAGNOSIS — Z01419 Encounter for gynecological examination (general) (routine) without abnormal findings: Secondary | ICD-10-CM | POA: Diagnosis not present

## 2019-03-21 DIAGNOSIS — N952 Postmenopausal atrophic vaginitis: Secondary | ICD-10-CM

## 2019-03-21 NOTE — Progress Notes (Signed)
    [name] Mar 06, 1951 [id number]        68 y.o.  G2P2 for annual gynecologic exam.  Without gynecologic complaints  Past medical history,surgical history, problem list, medications, allergies, family history and social history were all reviewed and documented as reviewed in the EPIC chart.  ROS:  Performed with pertinent positives and negatives included in the history, assessment and plan.   Additional significant findings : None   Exam: Caryn Bee assistant Vitals:   03/21/19 1207  BP: 124/78  Weight: 159 lb (72.1 kg)  Height: 5\' 5"  (1.651 m)   Body mass index is 26.46 kg/m.  General appearance:  Normal affect, orientation and appearance. Skin: Grossly normal HEENT: Without gross lesions.  No cervical or supraclavicular adenopathy. Thyroid normal.  Lungs:  Clear without wheezing, rales or rhonchi Cardiac: RR, without RMG Abdominal:  Soft, nontender, without masses, guarding, rebound, organomegaly or hernia Breasts:  Examined lying and sitting.  Left without masses, retractions, discharge or axillary adenopathy.  Right status post mastectomy with reconstruction.  No masses or axillary adenopathy Pelvic:  Ext, BUS, Vagina: With atrophic changes  Cervix: With atrophic changes  Uterus: Anteverted, normal size, shape and contour, midline and mobile nontender   Adnexa: Without masses or tenderness    Anus and perineum: Normal   Rectovaginal: Normal sphincter tone without palpated masses or tenderness.    Assessment/Plan:  68 y.o. G2P2 female for annual gynecologic exam.   1. Postmenopausal.  No significant menopausal symptoms or any vaginal bleeding. 2. Osteoporosis.  DEXA 08/2017 T score -2.2.  Prior DEXA 2014-2.7.  Had been on Zometa but this is been discontinued.  Recommend follow-up DEXA with her mammogram at [facility] end of this year.  Patient agrees to arrange. 3. History of right-sided breast cancer.  Status post mastectomy and reconstruction.  Exam NED  mammography 09/2018.  Continue with annual mammography end of this year when due. 4. Colonoscopy due/overdue.  Patient is going to call and arrange once COVID restrictions lifted. 5. Pap smear 09/2017.  No Pap smear done today.  No history of significant abnormal Pap smears.  Plan repeat Pap smear at 3-year interval. 6. Health maintenance.  No routine lab work done as patient does this elsewhere.  Follow-up 1 year, sooner as needed.   [name] MD, 12:35 PM 03/21/2019

## 2019-03-21 NOTE — Patient Instructions (Signed)
Schedule your bone density at [facility] the end of this coming year when you have your mammogram done.  Follow-up in 1 year for annual exam

## 2019-04-13 ENCOUNTER — Other Ambulatory Visit: Payer: Self-pay | Admitting: Family Medicine

## 2019-06-11 ENCOUNTER — Other Ambulatory Visit: Payer: Self-pay | Admitting: Family Medicine

## 2019-07-16 ENCOUNTER — Other Ambulatory Visit: Payer: Self-pay | Admitting: Family Medicine

## 2019-08-15 ENCOUNTER — Encounter: Payer: Self-pay | Admitting: Gastroenterology

## 2019-08-17 ENCOUNTER — Other Ambulatory Visit: Payer: Self-pay | Admitting: Family Medicine

## 2019-09-04 ENCOUNTER — Encounter: Payer: Self-pay | Admitting: Gynecology

## 2019-09-04 ENCOUNTER — Other Ambulatory Visit: Payer: Self-pay

## 2019-09-04 ENCOUNTER — Ambulatory Visit (AMBULATORY_SURGERY_CENTER): Payer: Self-pay | Admitting: *Deleted

## 2019-09-04 VITALS — Temp 96.0°F | Ht 65.0 in | Wt 160.8 lb

## 2019-09-04 DIAGNOSIS — Z86010 Personal history of colonic polyps: Secondary | ICD-10-CM

## 2019-09-04 MED ORDER — NA SULFATE-K SULFATE-MG SULF 17.5-3.13-1.6 GM/177ML PO SOLN: 1.0000 | Freq: Once | ORAL | 0 refills | Status: AC

## 2019-09-04 NOTE — Progress Notes (Signed)
No egg or soy allergy known to patient  No issues with past sedation with any surgeries  or procedures, no intubation problems  No diet pills per patient No home 02 use per patient  No blood thinners per patient  Pt denies issues with constipation  No A fib or A flutter  EMMI video sent to pt's e mail   Due to the COVID-19 pandemic we are asking patients to follow these guidelines. Please only bring one care partner. Please be aware that your care partner may wait in the car in the parking lot or if they feel like they will be too hot to wait in the car, they may wait in the lobby on the 4th floor. All care partners are required to wear a mask the entire time (we do not have any that we can provide them), they need to practice social distancing, and we will do a Covid check for all patient's and care partners when you arrive. Also we will check their temperature and your temperature. If the care partner waits in their car they need to stay in the parking lot the entire time and we will call them on their cell phone when the patient is ready for discharge so they can bring the car to the front of the building. Also all patient's will need to wear a mask into building.  suprep coupon provided.

## 2019-09-05 ENCOUNTER — Encounter: Payer: Self-pay | Admitting: Gastroenterology

## 2019-09-18 ENCOUNTER — Telehealth: Payer: Self-pay | Admitting: Gastroenterology

## 2019-09-18 NOTE — Telephone Encounter (Signed)

## 2019-09-19 ENCOUNTER — Ambulatory Visit (AMBULATORY_SURGERY_CENTER): Payer: 59 | Admitting: Gastroenterology

## 2019-09-19 ENCOUNTER — Encounter: Payer: Self-pay | Admitting: Gastroenterology

## 2019-09-19 ENCOUNTER — Other Ambulatory Visit: Payer: Self-pay

## 2019-09-19 VITALS — BP 145/76 | HR 72 | Temp 98.4°F | Resp 13 | Ht 65.0 in | Wt 160.8 lb

## 2019-09-19 DIAGNOSIS — Z86010 Personal history of colonic polyps: Secondary | ICD-10-CM | POA: Diagnosis present

## 2019-09-19 MED ORDER — SODIUM CHLORIDE 0.9 % IV SOLN: 500.0000 mL | Freq: Once | INTRAVENOUS | Status: DC

## 2019-09-19 NOTE — Patient Instructions (Signed)
  Handouts given:  Diverticulosis, Hemorrhoids, and High fiber diet. Start HIgh fiber diet  YOU HAD AN ENDOSCOPIC PROCEDURE TODAY AT [facility]:   Refer to the procedure report that was given to you for any specific questions about what was found during the examination.  If the procedure report does not answer your questions, please call your gastroenterologist to clarify.  If you requested that your care partner not be given the details of your procedure findings, then the procedure report has been included in a sealed envelope for you to review at your convenience later.  YOU SHOULD EXPECT: Some feelings of bloating in the abdomen. Passage of more gas than usual.  Walking can help get rid of the air that was put into your GI tract during the procedure and reduce the bloating. If you had a lower endoscopy (such as a colonoscopy or flexible sigmoidoscopy) you may notice spotting of blood in your stool or on the toilet paper. If you underwent a bowel prep for your procedure, you may not have a normal bowel movement for a few days.  Please Note:  You might notice some irritation and congestion in your nose or some drainage.  This is from the oxygen used during your procedure.  There is no need for concern and it should clear up in a day or so.  SYMPTOMS TO REPORT IMMEDIATELY:   Following lower endoscopy (colonoscopy or flexible sigmoidoscopy):  Excessive amounts of blood in the stool  Significant tenderness or worsening of abdominal pains  Swelling of the abdomen that is new, acute  Fever of 100F or higher  For urgent or emergent issues, a gastroenterologist can be reached at any hour by calling [phone].   DIET:  We do recommend a small meal at first, but then you may proceed to your regular diet.  Drink plenty of fluids but you should avoid alcoholic beverages for 24 hours.  ACTIVITY:  You should plan to take it easy for the rest of today and you should NOT DRIVE or  use heavy machinery until tomorrow (because of the sedation medicines used during the test).    FOLLOW UP: Our staff will call the number listed on your records 48-72 hours following your procedure to check on you and address any questions or concerns that you may have regarding the information given to you following your procedure. If we do not reach you, we will leave a message.  We will attempt to reach you two times.  During this call, we will ask if you have developed any symptoms of COVID 19. If you develop any symptoms (ie: fever, flu-like symptoms, shortness of breath, cough etc.) before then, please call [phone].  If you test positive for Covid 19 in the 2 weeks post procedure, please call and report this information to [place].    If any biopsies were taken you will be contacted by phone or by letter within the next 1-3 weeks.  Please call us at [phone] if you have not heard about the biopsies in 3 weeks.    SIGNATURES/CONFIDENTIALITY: You and/or your care partner have signed paperwork which will be entered into your electronic medical record.  These signatures attest to the fact that that the information above on your After Visit Summary has been reviewed and is understood.  Full responsibility of the confidentiality of this discharge information lies with you and/or your care-partner.

## 2019-09-19 NOTE — Progress Notes (Signed)
A and O x3. Report to RN. Tolerated MAC anesthesia well.

## 2019-09-19 NOTE — Op Note (Signed)
[facility] Patient Name: [name] Procedure Date: 09/19/2019 10:08 AM MRN: [id number] Endoscopist: Ladene Artist , MD Age: 68 Referring MD:  Date of Birth: Aug 27, 1951 Gender: Female Account #: [id number] Procedure:                Colonoscopy Indications:              Surveillance: Personal history of adenomatous                            polyps on last colonoscopy > 5 years ago Medicines:                Monitored Anesthesia Care Procedure:                Pre-Anesthesia Assessment:                           - Prior to the procedure, a History and Physical                            was performed, and patient medications and                            allergies were reviewed. The patient's tolerance of                            previous anesthesia was also reviewed. The risks                            and benefits of the procedure and the sedation                            options and risks were discussed with the patient.                            All questions were answered, and informed consent                            was obtained. Prior Anticoagulants: The patient has                            taken no previous anticoagulant or antiplatelet                            agents. ASA Grade Assessment: II - A patient with                            mild systemic disease. After reviewing the risks                            and benefits, the patient was deemed in                            satisfactory condition to undergo the procedure.  After obtaining informed consent, the colonoscope                            was passed under direct vision. Throughout the                            procedure, the patient's blood pressure, pulse, and                            oxygen saturations were monitored continuously. The                            Colonoscope was introduced through the anus and                            advanced to the the  cecum, identified by                            appendiceal orifice and ileocecal valve. The                            ileocecal valve, appendiceal orifice, and rectum                            were photographed. The quality of the bowel                            preparation was good. The colonoscopy was performed                            without difficulty. The patient tolerated the                            procedure well. Scope In: 10:18:00 AM Scope Out: 10:29:38 AM Scope Withdrawal Time: 0 hours 8 minutes 31 seconds  Total Procedure Duration: 0 hours 11 minutes 38 seconds  Findings:                 The perianal and digital rectal examinations were                            normal.                           A single medium-mouthed diverticulum was found in                            the ascending colon.                           Multiple small-mouthed diverticula were found in                            the left colon. There was narrowing of the colon in  association with the diverticular opening. There                            was evidence of diverticular spasm. There was no                            evidence of diverticular bleeding.                           Internal hemorrhoids were found during                            retroflexion. The hemorrhoids were small and Grade                            I (internal hemorrhoids that do not prolapse).                           The exam was otherwise without abnormality on                            direct and retroflexion views. Complications:            No immediate complications. Estimated blood loss:                            None. Estimated Blood Loss:     Estimated blood loss: none. Impression:               - Moderate diverticulosis in the left colon. There                            was narrowing of the colon in association with the                            diverticular opening. There was  evidence of                            diverticular spasm. There was no evidence of                            diverticular bleeding.                           - Single ascending colon diverticulum.                           - Internal hemorrhoids.                           - The examination was otherwise normal on direct                            and retroflexion views.                           - No  specimens collected. Recommendation:           - Repeat colonoscopy in 7 years for surveillance.                           - Patient has a contact number available for                            emergencies. The signs and symptoms of potential                            delayed complications were discussed with the                            patient. Return to normal activities tomorrow.                            Written discharge instructions were provided to the                            patient.                           - High fiber diet.                           - Continue present medications. Ladene Artist, MD 09/19/2019 10:34:12 AM This report has been signed electronically.

## 2019-09-23 ENCOUNTER — Telehealth: Payer: Self-pay | Admitting: *Deleted

## 2019-09-23 NOTE — Telephone Encounter (Signed)
1. Have you developed a fever since your procedure? no  2.   Have you had an respiratory symptoms (SOB or cough) since your procedure? no  3.   Have you tested positive for COVID 19 since your procedure no  4.   Have you had any family members/close contacts diagnosed with the COVID 19 since your procedure?  no   If yes to any of these questions please route to Joylene John, RN and Alphonsa Gin, Therapist, sports.  Follow up Call-  Call back number 09/19/2019  Post procedure Call Back phone  # [phone]  Permission to leave phone message Yes  Some recent data might be hidden     Patient questions:  Do you have a fever, pain , or abdominal swelling? No. Pain Score  0 *  Have you tolerated food without any problems? Yes.    Have you been able to return to your normal activities? Yes.    Do you have any questions about your discharge instructions: Diet   No. Medications  No. Follow up visit  No.  Do you have questions or concerns about your Care? No.  Actions: * If pain score is 4 or above: No action needed, pain <4.

## 2019-10-11 IMAGING — CR DG WRIST COMPLETE 3+V*L*
4 series · 4 of 4 positions shown · non-contrast
Comparison: None.

CLINICAL DATA: Acute LEFT wrist pain following fall. Initial
encounter.

EXAM:
LEFT WRIST - COMPLETE 3+ VIEW

[x wrist pa left]
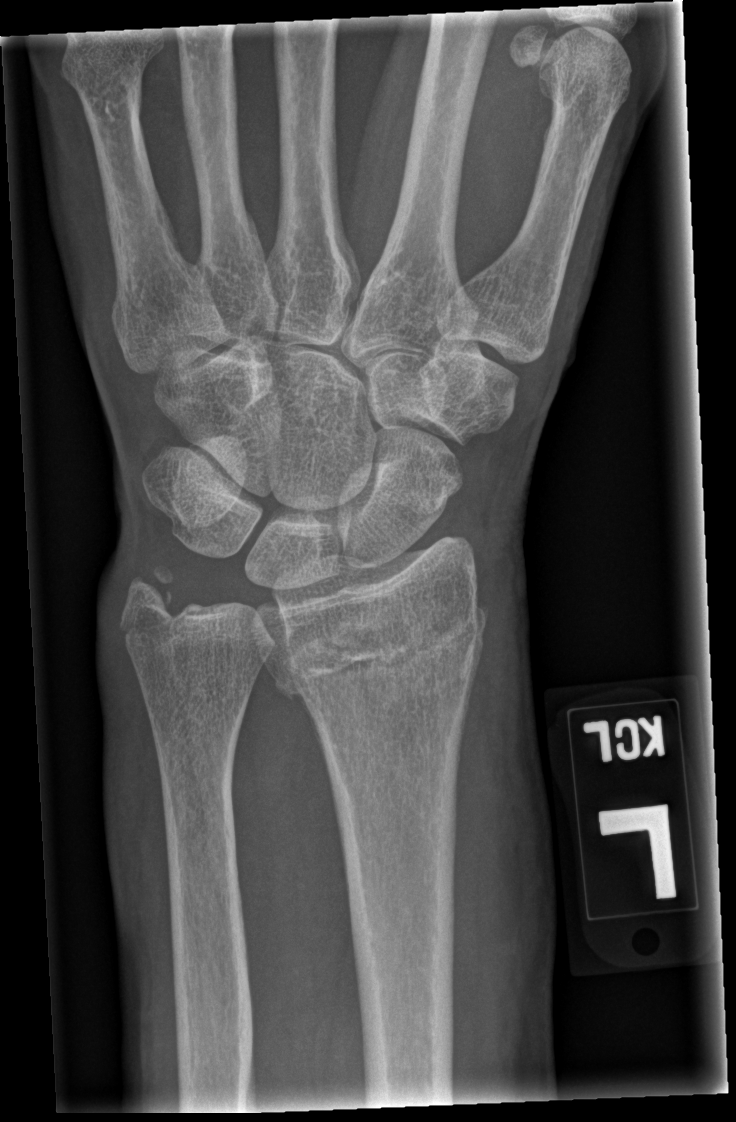

[x wrist obl left]
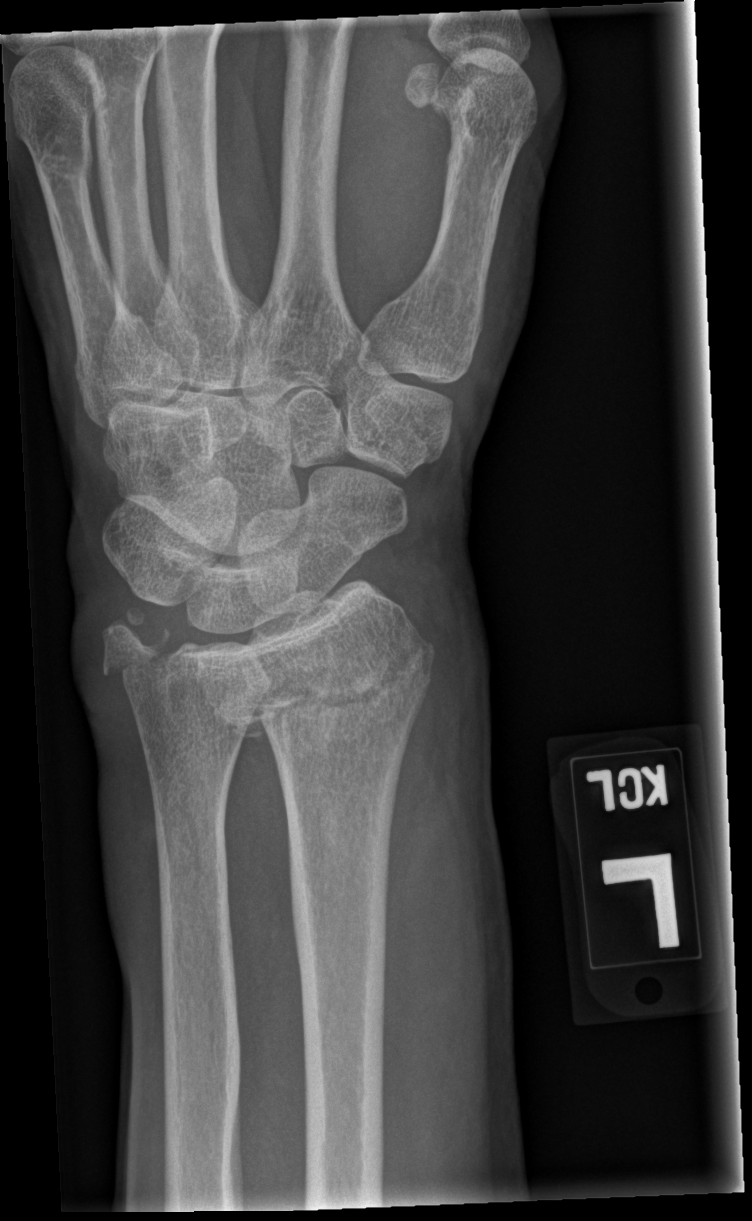

[x wrist lat left]
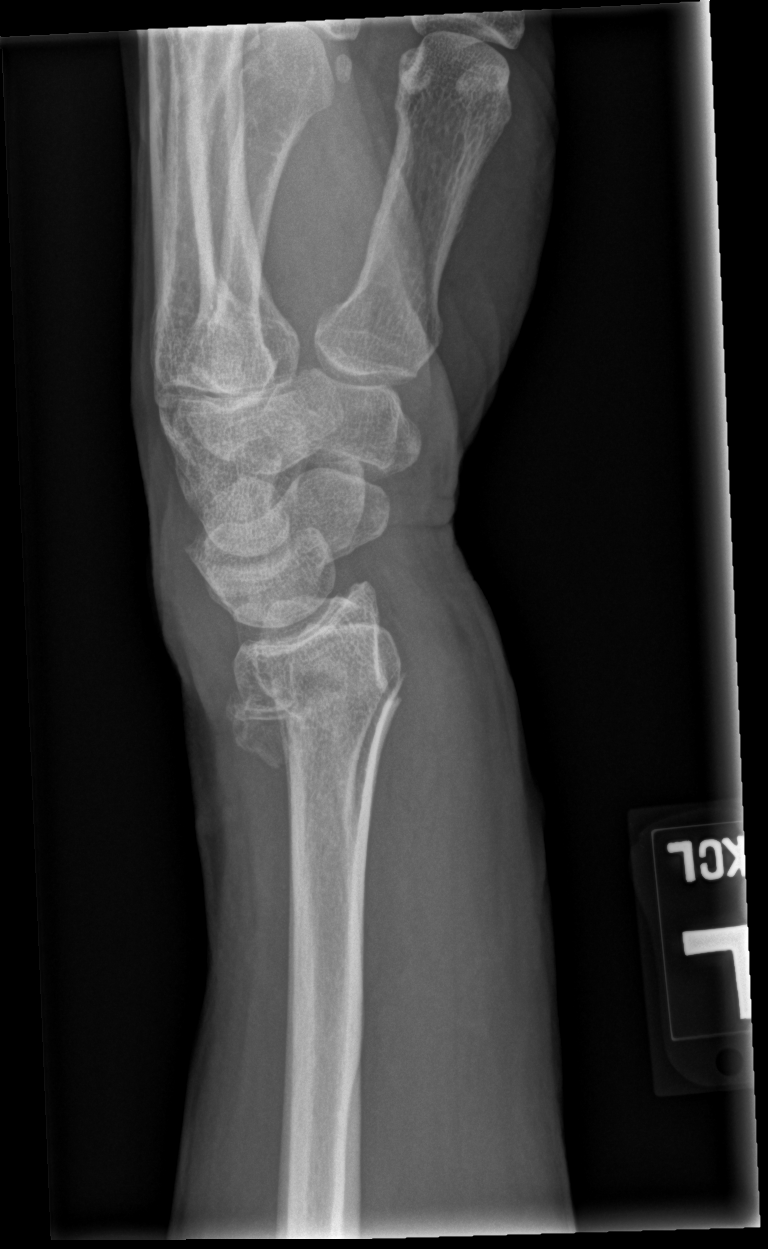

[x wrist navicular view left]
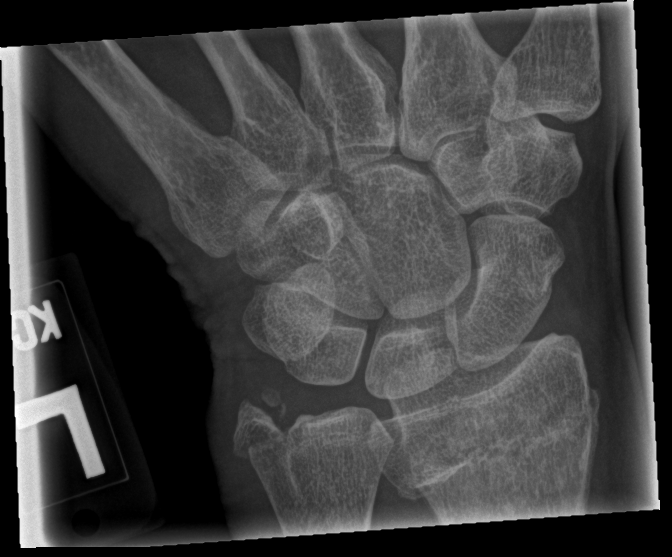

[4 of 4 positions shown; findings below may reference images not displayed]

FINDINGS: A fracture of the distal radial metaphysis is noted with
approximately 3 mm dorsal and ulnar displacement. There is possible
extension to the radiocarpal joint.

An ulnar styloid fracture is identified.

No subluxation or dislocation identified.
IMPRESSION: 1. Distal radial and ulnar styloid fractures as described. Possible
extension of distal radial fracture to the radiocarpal joint. No
dislocation.

## 2019-10-14 ENCOUNTER — Telehealth: Payer: Self-pay | Admitting: Family Medicine

## 2019-10-14 NOTE — Telephone Encounter (Signed)
She has not been seen for a year. She needs a visit, can be virtual. Once that is scheduled she can have #10 tabs til seen

## 2019-10-14 NOTE — Telephone Encounter (Signed)
/  Requesting:tramadol  Contract:yes UDS:low risk next screen 04/16/19 Last OV:11/19/219 Next OV:n/a Last Refill:10/16/2018  #[id number] Database:   Please advise

## 2019-10-15 NOTE — Telephone Encounter (Signed)
No.  Thanks

## 2019-10-15 NOTE — Telephone Encounter (Signed)
Pt is already scheduled on 11/30. Do I need to change appt?

## 2019-10-15 NOTE — Telephone Encounter (Signed)
Please schedule patient for virtual visit for medication follow up  Please advise

## 2019-10-28 ENCOUNTER — Other Ambulatory Visit: Payer: Self-pay

## 2019-10-28 ENCOUNTER — Ambulatory Visit (INDEPENDENT_AMBULATORY_CARE_PROVIDER_SITE_OTHER): Payer: 59 | Admitting: Family Medicine

## 2019-10-28 DIAGNOSIS — R0789 Other chest pain: Secondary | ICD-10-CM

## 2019-10-28 DIAGNOSIS — E785 Hyperlipidemia, unspecified: Secondary | ICD-10-CM

## 2019-10-28 DIAGNOSIS — F329 Major depressive disorder, single episode, unspecified: Secondary | ICD-10-CM

## 2019-10-28 DIAGNOSIS — M810 Age-related osteoporosis without current pathological fracture: Secondary | ICD-10-CM | POA: Diagnosis not present

## 2019-10-28 DIAGNOSIS — F419 Anxiety disorder, unspecified: Secondary | ICD-10-CM

## 2019-10-28 DIAGNOSIS — E559 Vitamin D deficiency, unspecified: Secondary | ICD-10-CM

## 2019-10-28 DIAGNOSIS — Z170 Estrogen receptor positive status [ER+]: Secondary | ICD-10-CM

## 2019-10-28 DIAGNOSIS — C50911 Malignant neoplasm of unspecified site of right female breast: Secondary | ICD-10-CM

## 2019-10-28 DIAGNOSIS — G8929 Other chronic pain: Secondary | ICD-10-CM

## 2019-10-28 MED ORDER — TRAMADOL HCL 50 MG PO TABS: 50.0000 mg | ORAL_TABLET | Freq: Two times a day (BID) | ORAL | 0 refills | Status: DC | PRN

## 2019-10-31 DIAGNOSIS — E559 Vitamin D deficiency, unspecified: Secondary | ICD-10-CM | POA: Insufficient documentation

## 2019-10-31 NOTE — Assessment & Plan Note (Addendum)
Is manageable on Gabapentin but has bad days is allowed Tramadol to use prn

## 2019-10-31 NOTE — Assessment & Plan Note (Signed)
Encouraged to get adequate exercise, calcium and vitamin d intake 

## 2019-10-31 NOTE — Assessment & Plan Note (Signed)
Follows with oncology. No new concerns.

## 2019-10-31 NOTE — Progress Notes (Signed)
Virtual Visit via phone Note  I connected with [name] on  at  2:20 PM EST by a phone enabled telemedicine application and verified that I am speaking with the correct person using two identifiers.  Location: Patient: home Provider: office   I discussed the limitations of evaluation and management by telemedicine and the availability of in person appointments. The patient expressed understanding and agreed to proceed. [name] CMA was able to get the patient set up on a phone visit after being unable to set up a video visit   Subjective:    Patient ID: [name], female    DOB: 12/31/1950, 68 y.o.   MRN: [id number]  Chief Complaint  Patient presents with  . Hip Pain    History of left hip pain, "needs tramadol refill"    HPI Patient is in today for follow up on chronic medical concerns including breast cancer, chronic pain, vitamin D deficiency and more. She was widowed this past year and is working from home so is struggling with isolation. She feels she is managing her stress with the lexapro but has been a long year. Is trying to maintain a heart healthy diet. Continues to struggle with chest wall pain she has suffered from since her mastectomy. Her Gabapentin is partially helpful. Denies CP/palp/SOB/HA/congestion/fevers/GI or GU c/o. Taking meds as prescribed  Past Medical History:  Diagnosis Date  . Arthritis   . Cancer [facility]) [id number]   Breast  . Chicken pox   . Colon polyps   . Depression with anxiety 09/07/2012  . Dyslipidemia   . Elevated BP 09/09/2012  . H/O tobacco use, presenting hazards to health 05/05/2011  . Left hip pain 05/04/2011  . Osteoporosis 2018   T score -2.2.  Prior DEXA 2014 T score -2.5  . Preventative health care 09/09/2012  . Tobacco abuse 05/05/2011    Past Surgical History:  Procedure Laterality Date  . APPENDECTOMY  2010  . BREAST LUMPECTOMY  1998   right  . BREAST SURGERY  07/08/11   right mastectomy+axlnd,  reconstruction,T1cN1a,ERPR+,HER2-  . BREAST SURGERY  march,2013   breast implant deflated  . COLONOSCOPY    . POLYPECTOMY    . WRIST FRACTURE SURGERY      Family History  Problem Relation Age of Onset  . Diabetes Mother        type 2  . Obesity Mother   . Cancer Father        ?  Marland Kitchen Heart block Sister   . Heart disease Sister        s/p CABG  . Heart disease Brother        s/p CABG  . Colon cancer Neg Hx   . Esophageal cancer Neg Hx   . Rectal cancer Neg Hx   . Stomach cancer Neg Hx   . Colon polyps Neg Hx     Social History   Socioeconomic History  . Marital status: Widowed    Spouse name: Not on file  . Number of children: Not on file  . Years of education: Not on file  . Highest education level: Not on file  Occupational History  . Not on file  Social Needs  . Financial resource strain: Not on file  . Food insecurity    Worry: Not on file    Inability: Not on file  . Transportation needs    Medical: Not on file    Non-medical: Not on file  Tobacco Use  .  Smoking status: Current Some Day Smoker  . Smokeless tobacco: Never Used  Substance and Sexual Activity  . Alcohol use: Yes    Alcohol/week: 0.0 standard drinks    Comment: occasionally  . Drug use: No  . Sexual activity: Not Currently    Comment: 1st intercourse 56 yo-5 partners  Lifestyle  . Physical activity    Days per week: Not on file    Minutes per session: Not on file  . Stress: Not on file  Relationships  . Social Herbalist on phone: Not on file    Gets together: Not on file    Attends religious service: Not on file    Active member of club or organization: Not on file    Attends meetings of clubs or organizations: Not on file    Relationship status: Not on file  . Intimate partner violence    Fear of current or ex partner: Not on file    Emotionally abused: Not on file    Physically abused: Not on file    Forced sexual activity: Not on file  Other Topics Concern  . Not on  file  Social History Narrative  . Not on file    Outpatient Medications Prior to Visit  Medication Sig Dispense Refill  . acetaminophen (TYLENOL) 500 MG tablet Take 500 mg by mouth daily as needed for moderate pain.    . Ascorbic Acid (VITAMIN C PO) Take by mouth.    . escitalopram (LEXAPRO) 10 MG tablet TAKE 1 TABLET BY MOUTH EVERY DAY 60 tablet 0  . gabapentin (NEURONTIN) 300 MG capsule TAKE ONE CAPSULE BY MOUTH AT BEDTIME 30 capsule 4  . ibuprofen (ADVIL,MOTRIN) 400 MG tablet Take 400 mg by mouth every 6 (six) hours as needed for pain.    . Multiple Vitamins-Minerals (CENTRUM SILVER PO) Take by mouth.    . Multiple Vitamins-Minerals (ZINC PO) Take by mouth.    . Vitamin D, Ergocalciferol, (DRISDOL) 1.25 MG (50000 UT) CAPS capsule TAKE 1 CAPSULE (50,000 UNITS TOTAL) BY MOUTH EVERY 7 (SEVEN) DAYS. 4 capsule 4  . traMADol (ULTRAM) 50 MG tablet TAKE 1 TABLET (50 MG TOTAL) BY MOUTH 2 (TWO) TIMES DAILY AS NEEDED. 10 tablet 0   No facility-administered medications prior to visit.     No Known Allergies  Review of Systems  Constitutional: Positive for malaise/fatigue. Negative for fever.  HENT: Negative for congestion.   Eyes: Negative for blurred vision.  Respiratory: Negative for shortness of breath.   Cardiovascular: Positive for chest pain. Negative for palpitations and leg swelling.  Gastrointestinal: Negative for abdominal pain, blood in stool and nausea.  Genitourinary: Negative for dysuria and frequency.  Musculoskeletal: Positive for joint pain. Negative for falls.  Skin: Negative for rash.  Neurological: Negative for dizziness, loss of consciousness and headaches.  Endo/Heme/Allergies: Negative for environmental allergies.  Psychiatric/Behavioral: Positive for depression. The patient is nervous/anxious.        Objective:    Physical Exam unable to obtain via phone visit  There were no vitals taken for this visit. Wt Readings from Last 3 Encounters:  09/19/19 160 lb  12.8 oz (72.9 kg)  09/04/19 160 lb 12.8 oz (72.9 kg)  03/21/19 159 lb (72.1 kg)    Diabetic Foot Exam - Simple   No data filed     Lab Results  Component Value Date   WBC 7.7 09/01/2016   HGB 13.9 09/01/2016   HCT 42.9 09/01/2016   PLT 295 09/01/2016  GLUCOSE 83 10/16/2018   CHOL 193 02/02/2016   TRIG 113 02/02/2016   HDL 43 02/02/2016   LDLDIRECT 149.1 05/10/2011   LDLCALC 127 02/02/2016   ALT 14 10/16/2018   AST 16 10/16/2018   NA 139 10/16/2018   K 4.2 10/16/2018   CL 103 10/16/2018   CREATININE 0.65 10/16/2018   BUN 23 10/16/2018   CO2 28 10/16/2018   TSH 1.76 02/02/2016   HGBA1C 5.6 02/02/2016    Lab Results  Component Value Date   TSH 1.76 02/02/2016   Lab Results  Component Value Date   WBC 7.7 09/01/2016   HGB 13.9 09/01/2016   HCT 42.9 09/01/2016   MCV 90.0 09/01/2016   PLT 295 09/01/2016   Lab Results  Component Value Date   NA 139 10/16/2018   K 4.2 10/16/2018   CHLORIDE 106 09/01/2016   CO2 28 10/16/2018   GLUCOSE 83 10/16/2018   BUN 23 10/16/2018   CREATININE 0.65 10/16/2018   BILITOT 0.4 10/16/2018   ALKPHOS 60 10/16/2018   AST 16 10/16/2018   ALT 14 10/16/2018   PROT 6.9 10/16/2018   ALBUMIN 4.2 10/16/2018   CALCIUM 9.1 10/16/2018   ANIONGAP 9 09/01/2016   EGFR >90 09/01/2016   GFR 96.47 10/16/2018   Lab Results  Component Value Date   CHOL 193 02/02/2016   Lab Results  Component Value Date   HDL 43 02/02/2016   Lab Results  Component Value Date   LDLCALC 127 02/02/2016   Lab Results  Component Value Date   TRIG 113 02/02/2016   Lab Results  Component Value Date   CHOLHDL 5 10/09/2014   Lab Results  Component Value Date   HGBA1C 5.6 02/02/2016       Assessment & Plan:   Problem List Items Addressed This Visit    Breast cancer, right breast ([place])    Follows with oncology. No new concerns.       Dyslipidemia - Primary    Encouraged heart healthy diet, increase exercise, avoid trans fats, consider a krill  oil cap daily      Relevant Orders   Lipid panel   TSH   Osteoporosis    Encouraged to get adequate exercise, calcium and vitamin d intake      Chronic chest wall pain    Is manageable on Gabapentin but has bad days is allowed Tramadol to use prn      Relevant Medications   traMADol (ULTRAM) 50 MG tablet   Other Relevant Orders   CBC   CMP   TSH   Anxiety and depression    Was widowed this past year and is struggling with isolation while working from home but she does feel the Lexapro is helpful. No changes      Vitamin D deficiency    Supplement and monitor      Relevant Orders   VITAMIN D      I have changed Rochell M. Gaglio's traMADol. I am also having her maintain her ibuprofen, gabapentin, acetaminophen, Vitamin D (Ergocalciferol), escitalopram, Multiple Vitamins-Minerals (CENTRUM SILVER PO), Multiple Vitamins-Minerals (ZINC PO), and Ascorbic Acid (VITAMIN C PO).  Meds ordered this encounter  Medications  . traMADol (ULTRAM) 50 MG tablet    Sig: Take 1 tablet (50 mg total) by mouth 2 (two) times daily as needed for moderate pain.    Dispense:  60 tablet    Refill:  0    This request is for a new prescription for a  controlled substance as required by Federal/State law.REFILL.    I discussed the assessment and treatment plan with the patient. The patient was provided an opportunity to ask questions and all were answered. The patient agreed with the plan and demonstrated an understanding of the instructions.   The patient was advised to call back or seek an in-person evaluation if the symptoms worsen or if the condition fails to improve as anticipated.  I provided 25 minutes of non-face-to-face time during this encounter.   Penni Homans, MD

## 2019-10-31 NOTE — Assessment & Plan Note (Signed)
Supplement and monitor 

## 2019-10-31 NOTE — Assessment & Plan Note (Signed)
Encouraged heart healthy diet, increase exercise, avoid trans fats, consider a krill oil cap daily 

## 2019-10-31 NOTE — Assessment & Plan Note (Signed)
Was widowed this past year and is struggling with isolation while working from home but she does feel the Lexapro is helpful. No changes

## 2019-11-03 ENCOUNTER — Other Ambulatory Visit: Payer: Self-pay | Admitting: Family Medicine

## 2019-11-04 NOTE — Telephone Encounter (Signed)
Refill sent and pt has been notified.

## 2019-11-04 NOTE — Telephone Encounter (Signed)
Pt called stating she is completely out of this medication. Please advise.

## 2019-11-13 ENCOUNTER — Encounter: Payer: Self-pay | Admitting: Gynecology

## 2019-11-15 ENCOUNTER — Encounter: Payer: Self-pay | Admitting: Gynecology

## 2019-12-03 ENCOUNTER — Telehealth: Payer: Self-pay | Admitting: Family Medicine

## 2019-12-03 NOTE — Telephone Encounter (Signed)
LM for pt to call back to schedule appts  1. Lab appt at pt convenience (any time in near future).  2. CPE end of May or beginning of June with Dr. Charlett Blake

## 2019-12-19 ENCOUNTER — Other Ambulatory Visit: Payer: Self-pay | Admitting: Family Medicine

## 2019-12-20 NOTE — Telephone Encounter (Signed)
Requesting:tramadol Contract:yes UDS:low risk  Last OV:10/28/19 Next OV:n/a Last Refill: 10/28/19  #60-0rf Database:   Please advise

## 2020-02-07 ENCOUNTER — Other Ambulatory Visit: Payer: Self-pay | Admitting: Family Medicine

## 2020-02-18 ENCOUNTER — Other Ambulatory Visit: Payer: Self-pay | Admitting: Family Medicine

## 2020-02-19 ENCOUNTER — Other Ambulatory Visit: Payer: Self-pay | Admitting: Family Medicine

## 2020-02-19 MED ORDER — TRAMADOL HCL 50 MG PO TABS: 50.0000 mg | ORAL_TABLET | Freq: Two times a day (BID) | ORAL | 0 refills | Status: DC | PRN

## 2020-02-19 NOTE — Telephone Encounter (Signed)
Last written: Last ov: 10/28/19 Next ov: none Contract: 10/16/18 UDS: 10/16/18

## 2020-02-19 NOTE — Telephone Encounter (Signed)
Patient called in to get Dr. Charlett Blake to send in a prescription for traMADol (ULTRAM) 50 MG tablet [id number]   Please send it to [id number] - [place], [address], [place] [address]  Phone:  [phone] Fax:  [phone]  DEA #:  [id number]

## 2020-02-19 NOTE — Telephone Encounter (Signed)
I have refilled Tramadol but she needs an appt in the next 2 months

## 2020-02-20 ENCOUNTER — Ambulatory Visit: Payer: 59 | Attending: Internal Medicine

## 2020-02-20 DIAGNOSIS — Z23 Encounter for immunization: Secondary | ICD-10-CM

## 2020-02-20 NOTE — Telephone Encounter (Signed)
Left detailed message on machine to call back to scheduled.

## 2020-02-20 NOTE — Progress Notes (Signed)
   Covid-19 Vaccination Clinic  Name:  [name]    MRN: [id number] DOB: October 13, 1951  02/20/2020  [name] was observed post Covid-19 immunization for 15 minutes without incident. She was provided with Vaccine Information Sheet and instruction to access the V-Safe system.   [name] was instructed to call 911 with any severe reactions post vaccine: Marland Kitchen Difficulty breathing  . Swelling of face and throat  . A fast heartbeat  . A bad rash all over body  . Dizziness and weakness   Immunizations Administered    Name Date Dose VIS Date Route   Pfizer COVID-19 Vaccine 02/20/2020  3:18 PM 0.3 mL 11/08/2019 Intramuscular   Manufacturer: [place]   Lot: [id number]   [place]: [id number]

## 2020-03-16 ENCOUNTER — Telehealth: Payer: Self-pay

## 2020-03-16 NOTE — Telephone Encounter (Signed)
Called the patient back and she is fine now. She placed this call on Friday morning. She stated she did not get a return call but is doing well now and having no symptoms

## 2020-03-16 NOTE — Telephone Encounter (Signed)
After Hours Call:   Caller states she had her first vaccination two weeks ago. The caller has painful legs and having difficulty walking. Caller would like to speak to Dr. Charlett Blake. [name] 1951/10/20 [phone]  Additional Comment Could Dr. Charlett Blake please contact [name]? She is complaining of immunization reaction.

## 2020-03-18 ENCOUNTER — Ambulatory Visit: Payer: 59 | Attending: Internal Medicine

## 2020-03-18 DIAGNOSIS — Z23 Encounter for immunization: Secondary | ICD-10-CM

## 2020-03-18 NOTE — Progress Notes (Signed)
   Covid-19 Vaccination Clinic  Name:  ENDEA YAEGER    MRN: [id number] DOB: 1951/09/23  03/18/2020  Ms. Sweetland was observed post Covid-19 immunization for 15 minutes without incident. She was provided with Vaccine Information Sheet and instruction to access the V-Safe system.   Ms. Benney was instructed to call 911 with any severe reactions post vaccine: Marland Kitchen Difficulty breathing  . Swelling of face and throat  . A fast heartbeat  . A bad rash all over body  . Dizziness and weakness   Immunizations Administered    Name Date Dose VIS Date Route   Pfizer COVID-19 Vaccine 03/18/2020  2:57 PM 0.3 mL 01/22/2019 Intramuscular   Manufacturer: [place]   Lot: [id number]   [place]: [id number]

## 2020-04-20 ENCOUNTER — Other Ambulatory Visit: Payer: Self-pay | Admitting: Family Medicine

## 2020-04-20 ENCOUNTER — Telehealth: Payer: Self-pay | Admitting: Family Medicine

## 2020-04-20 MED ORDER — TRAMADOL HCL 50 MG PO TABS: 50.0000 mg | ORAL_TABLET | Freq: Two times a day (BID) | ORAL | 0 refills | Status: DC | PRN

## 2020-04-20 NOTE — Telephone Encounter (Signed)
Caller: Kenli Call Back # [phone]   Medication:traMADol (ULTRAM) 50 MG tablet [id number]    Has the patient contacted their pharmacy? No. (If no, request that the patient contact the pharmacy for the refill.) (If yes, when and what did the pharmacy advise?)  Preferred Pharmacy (with phone number or street name): [id number] [place], [address], [place] [address]  Phone:  [phone] Fax:  [phone]  DEA #:  [id number]  Agent: Please be advised that RX refills may take up to 3 business days. We ask that you follow-up with your pharmacy.

## 2020-04-20 NOTE — Telephone Encounter (Signed)
I have sent in the Tramadol for her please let her know

## 2020-04-20 NOTE — Telephone Encounter (Signed)
Refill request for Tramadol Last OV--10/28/2019 Next OV--05/04/2020 Last RF--02/19/2020  #60 UDS/CSC--10/16/2018

## 2020-05-04 ENCOUNTER — Telehealth: Payer: 59 | Admitting: Family Medicine

## 2020-05-04 ENCOUNTER — Other Ambulatory Visit: Payer: Self-pay

## 2020-05-04 DIAGNOSIS — F419 Anxiety disorder, unspecified: Secondary | ICD-10-CM

## 2020-05-04 MED ORDER — ESCITALOPRAM OXALATE 10 MG PO TABS: 10.0000 mg | ORAL_TABLET | Freq: Every day | ORAL | 0 refills | Status: DC

## 2020-05-04 NOTE — Progress Notes (Signed)
Was unable to connect to patient via mychart, cell phone or home phone. Sent in a refill on her escitalopram and let her a message regarding refill. Will try and connect with patient tomorrow.

## 2020-05-05 ENCOUNTER — Ambulatory Visit (INDEPENDENT_AMBULATORY_CARE_PROVIDER_SITE_OTHER): Payer: 59 | Admitting: Family Medicine

## 2020-05-05 ENCOUNTER — Encounter: Payer: Self-pay | Admitting: Family Medicine

## 2020-05-05 ENCOUNTER — Other Ambulatory Visit: Payer: Self-pay

## 2020-05-05 ENCOUNTER — Telehealth: Payer: Self-pay | Admitting: Family Medicine

## 2020-05-05 VITALS — Ht 66.0 in

## 2020-05-05 DIAGNOSIS — E559 Vitamin D deficiency, unspecified: Secondary | ICD-10-CM | POA: Diagnosis not present

## 2020-05-05 DIAGNOSIS — B001 Herpesviral vesicular dermatitis: Secondary | ICD-10-CM | POA: Diagnosis not present

## 2020-05-05 DIAGNOSIS — E876 Hypokalemia: Secondary | ICD-10-CM

## 2020-05-05 DIAGNOSIS — E785 Hyperlipidemia, unspecified: Secondary | ICD-10-CM | POA: Diagnosis not present

## 2020-05-05 DIAGNOSIS — M810 Age-related osteoporosis without current pathological fracture: Secondary | ICD-10-CM | POA: Diagnosis not present

## 2020-05-05 DIAGNOSIS — R739 Hyperglycemia, unspecified: Secondary | ICD-10-CM

## 2020-05-05 DIAGNOSIS — F329 Major depressive disorder, single episode, unspecified: Secondary | ICD-10-CM

## 2020-05-05 DIAGNOSIS — F419 Anxiety disorder, unspecified: Secondary | ICD-10-CM

## 2020-05-05 MED ORDER — ESCITALOPRAM OXALATE 10 MG PO TABS: 10.0000 mg | ORAL_TABLET | Freq: Every day | ORAL | 1 refills | Status: DC

## 2020-05-05 NOTE — Telephone Encounter (Signed)
-----   Message from [place], [place] sent at 05/05/2020  4:06 PM EDT ----- Regarding: Lab appt and cpe Needs lab appt in the next several days please and CPX sometime in October or November please.

## 2020-05-05 NOTE — Telephone Encounter (Signed)
Called patient left a msg for her to call back for follow up appt

## 2020-05-11 DIAGNOSIS — R739 Hyperglycemia, unspecified: Secondary | ICD-10-CM | POA: Insufficient documentation

## 2020-05-11 NOTE — Assessment & Plan Note (Addendum)
hgba1c acceptable, minimize simple carbs. Increase exercise as tolerated.  

## 2020-05-11 NOTE — Progress Notes (Signed)
Virtual Visit via phone Note  I connected with [name] on 6/8/21at  3:00 PM EDT by a enabled telemedicine application and verified that I am speaking with the correct person using two identifiers.  Location: Patient: home Provider: office   I discussed the limitations of evaluation and management by telemedicine and the availability of in person appointments. The patient expressed understanding and agreed to proceed. Kem Boroughs, CMA was able to get the patient set up on on a phone visit after being unable to set up on a video visit.       Subjective:    Patient ID: [name], female    DOB: Nov 03, 1951, 69 y.o.   MRN: [id number]  Chief Complaint  Patient presents with  . Follow-up    HPI Patient is in today for follow up on chronic medical concerns. No recent febrile illness or hospitalizations. She has managed the stress of the pandemic and the stress of loosing her husband on Escitalopram. She struggles with fatigue and generalized myalgias. But otherwise she is doing well. Denies CP/palp/SOB/HA/congestion/fevers/GI or GU c/o. Taking meds as prescribed  Past Medical History:  Diagnosis Date  . Arthritis   . Cancer [facility]) [id number]   Breast  . Chicken pox   . Colon polyps   . Depression with anxiety 09/07/2012  . Dyslipidemia   . Elevated BP 09/09/2012  . H/O tobacco use, presenting hazards to health 05/05/2011  . Left hip pain 05/04/2011  . Osteoporosis 10/2019   T score -2.2.  Stable from prior DEXA 2018. DEXA 2014 T score -2.5  . Preventative health care 09/09/2012  . Tobacco abuse 05/05/2011    Past Surgical History:  Procedure Laterality Date  . APPENDECTOMY  2010  . BREAST LUMPECTOMY  1998   right  . BREAST SURGERY  07/08/11   right mastectomy+axlnd, reconstruction,T1cN1a,ERPR+,HER2-  . BREAST SURGERY  march,2013   breast implant deflated  . COLONOSCOPY    . POLYPECTOMY    . WRIST FRACTURE SURGERY      Family History  Problem Relation  Age of Onset  . Diabetes Mother        type 2  . Obesity Mother   . Cancer Father        ?  Marland Kitchen Heart block Sister   . Heart disease Sister        s/p CABG  . Heart disease Brother        s/p CABG  . Colon cancer Neg Hx   . Esophageal cancer Neg Hx   . Rectal cancer Neg Hx   . Stomach cancer Neg Hx   . Colon polyps Neg Hx     Social History   Socioeconomic History  . Marital status: Widowed    Spouse name: Not on file  . Number of children: Not on file  . Years of education: Not on file  . Highest education level: Not on file  Occupational History  . Not on file  Tobacco Use  . Smoking status: Current Some Day Smoker  . Smokeless tobacco: Never Used  Vaping Use  . Vaping Use: Never used  Substance and Sexual Activity  . Alcohol use: Yes    Alcohol/week: 0.0 standard drinks    Comment: occasionally  . Drug use: No  . Sexual activity: Not Currently    Comment: 1st intercourse 54 yo-5 partners  Other Topics Concern  . Not on file  Social History Narrative  . Not on file   Social  Determinants of Health   Financial Resource Strain:   . Difficulty of Paying Living Expenses:   Food Insecurity:   . Worried About Charity fundraiser in the Last Year:   . Arboriculturist in the Last Year:   Transportation Needs:   . Film/video editor (Medical):   Marland Kitchen Lack of Transportation (Non-Medical):   Physical Activity:   . Days of Exercise per Week:   . Minutes of Exercise per Session:   Stress:   . Feeling of Stress :   Social Connections:   . Frequency of Communication with Friends and Family:   . Frequency of Social Gatherings with Friends and Family:   . Attends Religious Services:   . Active Member of Clubs or Organizations:   . Attends Archivist Meetings:   Marland Kitchen Marital Status:   Intimate Partner Violence:   . Fear of Current or Ex-Partner:   . Emotionally Abused:   Marland Kitchen Physically Abused:   . Sexually Abused:     Outpatient Medications Prior to Visit    Medication Sig Dispense Refill  . acetaminophen (TYLENOL) 500 MG tablet Take 500 mg by mouth daily as needed for moderate pain.    . Ascorbic Acid (VITAMIN C PO) Take by mouth.    . gabapentin (NEURONTIN) 300 MG capsule TAKE ONE CAPSULE BY MOUTH AT BEDTIME 30 capsule 4  . ibuprofen (ADVIL,MOTRIN) 400 MG tablet Take 400 mg by mouth every 6 (six) hours as needed for pain.    . Multiple Vitamins-Minerals (CENTRUM SILVER PO) Take by mouth.    . Multiple Vitamins-Minerals (ZINC PO) Take by mouth.    . traMADol (ULTRAM) 50 MG tablet Take 1 tablet (50 mg total) by mouth 2 (two) times daily as needed for moderate pain. 60 tablet 0  . Vitamin D, Ergocalciferol, (DRISDOL) 1.25 MG (50000 UNIT) CAPS capsule TAKE 1 CAPSULE (50,000 UNITS TOTAL) BY MOUTH EVERY 7 (SEVEN) DAYS. 4 capsule 4  . escitalopram (LEXAPRO) 10 MG tablet Take 1 tablet (10 mg total) by mouth daily. 90 tablet 0   No facility-administered medications prior to visit.    No Known Allergies  Review of Systems  Constitutional: Positive for malaise/fatigue. Negative for fever.  HENT: Negative for congestion.   Eyes: Negative for blurred vision.  Respiratory: Negative for shortness of breath.   Cardiovascular: Negative for chest pain, palpitations and leg swelling.  Gastrointestinal: Negative for abdominal pain, blood in stool and nausea.  Genitourinary: Negative for dysuria and frequency.  Musculoskeletal: Positive for myalgias. Negative for falls.  Skin: Negative for rash.  Neurological: Negative for dizziness, loss of consciousness and headaches.  Endo/Heme/Allergies: Negative for environmental allergies.  Psychiatric/Behavioral: Negative for depression. The patient is not nervous/anxious.        Objective:    Physical Exam was unable to obtain via phone visit.   Ht _0  (1.676 m)   BMI 25.95 kg/m  Wt Readings from Last 3 Encounters:  09/19/19 160 lb 12.8 oz (72.9 kg)  09/04/19 160 lb 12.8 oz (72.9 kg)  03/21/19 159 lb  (72.1 kg)    Diabetic Foot Exam - Simple   No data filed     Lab Results  Component Value Date   WBC 7.7 09/01/2016   HGB 13.9 09/01/2016   HCT 42.9 09/01/2016   PLT 295 09/01/2016   GLUCOSE 83 10/16/2018   CHOL 193 02/02/2016   TRIG 113 02/02/2016   HDL 43 02/02/2016   LDLDIRECT 149.1 05/10/2011  LDLCALC 127 02/02/2016   ALT 14 10/16/2018   AST 16 10/16/2018   NA 139 10/16/2018   K 4.2 10/16/2018   CL 103 10/16/2018   CREATININE 0.65 10/16/2018   BUN 23 10/16/2018   CO2 28 10/16/2018   TSH 1.76 02/02/2016   HGBA1C 5.6 02/02/2016    Lab Results  Component Value Date   TSH 1.76 02/02/2016   Lab Results  Component Value Date   WBC 7.7 09/01/2016   HGB 13.9 09/01/2016   HCT 42.9 09/01/2016   MCV 90.0 09/01/2016   PLT 295 09/01/2016   Lab Results  Component Value Date   NA 139 10/16/2018   K 4.2 10/16/2018   CHLORIDE 106 09/01/2016   CO2 28 10/16/2018   GLUCOSE 83 10/16/2018   BUN 23 10/16/2018   CREATININE 0.65 10/16/2018   BILITOT 0.4 10/16/2018   ALKPHOS 60 10/16/2018   AST 16 10/16/2018   ALT 14 10/16/2018   PROT 6.9 10/16/2018   ALBUMIN 4.2 10/16/2018   CALCIUM 9.1 10/16/2018   ANIONGAP 9 09/01/2016   EGFR >90 09/01/2016   GFR 96.47 10/16/2018   Lab Results  Component Value Date   CHOL 193 02/02/2016   Lab Results  Component Value Date   HDL 43 02/02/2016   Lab Results  Component Value Date   LDLCALC 127 02/02/2016   Lab Results  Component Value Date   TRIG 113 02/02/2016   Lab Results  Component Value Date   CHOLHDL 5 10/09/2014   Lab Results  Component Value Date   HGBA1C 5.6 02/02/2016       Assessment & Plan:   Problem List Items Addressed This Visit    Dyslipidemia - Primary    Encouraged heart healthy diet, increase exercise, avoid trans fats, consider a krill oil cap daily      Relevant Orders   Lipid panel   TSH   Osteoporosis    Encouraged to get adequate exercise, calcium and vitamin d intake       Hypokalemia   Relevant Orders   Comprehensive metabolic panel   Recurrent cold sores   Relevant Orders   CBC   Anxiety and depression    Managed the loss of her husband and the pandemic on Lexapro 10 mg daily, refill given today      Relevant Medications   escitalopram (LEXAPRO) 10 MG tablet   Vitamin D deficiency    Supplement and monitor      Relevant Orders   VITAMIN D 25 Hydroxy (Vit-D Deficiency, Fractures)   Hyperglycemia    hgba1c acceptable, minimize simple carbs. Increase exercise as tolerated.       Relevant Orders   CBC   TSH   Hemoglobin A1c      I am having Gabrial M. Delorse Lek maintain her ibuprofen, gabapentin, acetaminophen, Multiple Vitamins-Minerals (CENTRUM SILVER PO), Multiple Vitamins-Minerals (ZINC PO), Ascorbic Acid (VITAMIN C PO), Vitamin D (Ergocalciferol), traMADol, and escitalopram.  Meds ordered this encounter  Medications  . escitalopram (LEXAPRO) 10 MG tablet    Sig: Take 1 tablet (10 mg total) by mouth daily.    Dispense:  90 tablet    Refill:  1     I discussed the assessment and treatment plan with the patient. The patient was provided an opportunity to ask questions and all were answered. The patient agreed with the plan and demonstrated an understanding of the instructions.   The patient was advised to call back or seek an in-person evaluation if  the symptoms worsen or if the condition fails to improve as anticipated.  I provided 25 minutes of non-face-to-face time during this encounter.   Penni Homans, MD

## 2020-05-11 NOTE — Assessment & Plan Note (Signed)
Supplement and monitor 

## 2020-05-11 NOTE — Assessment & Plan Note (Signed)
Managed the loss of her husband and the pandemic on Lexapro 10 mg daily, refill given today

## 2020-05-11 NOTE — Assessment & Plan Note (Signed)
Encouraged to get adequate exercise, calcium and vitamin d intake 

## 2020-05-11 NOTE — Assessment & Plan Note (Signed)
Encouraged heart healthy diet, increase exercise, avoid trans fats, consider a krill oil cap daily 

## 2020-05-14 ENCOUNTER — Ambulatory Visit: Payer: 59 | Admitting: Family Medicine

## 2020-05-21 ENCOUNTER — Other Ambulatory Visit (INDEPENDENT_AMBULATORY_CARE_PROVIDER_SITE_OTHER): Payer: 59

## 2020-05-21 ENCOUNTER — Other Ambulatory Visit: Payer: Self-pay

## 2020-05-21 DIAGNOSIS — R739 Hyperglycemia, unspecified: Secondary | ICD-10-CM

## 2020-05-21 DIAGNOSIS — E785 Hyperlipidemia, unspecified: Secondary | ICD-10-CM

## 2020-05-21 DIAGNOSIS — E876 Hypokalemia: Secondary | ICD-10-CM

## 2020-05-21 DIAGNOSIS — B001 Herpesviral vesicular dermatitis: Secondary | ICD-10-CM | POA: Diagnosis not present

## 2020-05-21 DIAGNOSIS — E559 Vitamin D deficiency, unspecified: Secondary | ICD-10-CM | POA: Diagnosis not present

## 2020-05-21 LAB — LIPID PANEL
Cholesterol: 202 mg/dL — ABNORMAL HIGH (ref 0–200)
HDL: 43.9 mg/dL (ref >39.00)
LDL Cholesterol: 133 mg/dL — ABNORMAL HIGH (ref 0–99)
NonHDL: 157.95
Total CHOL/HDL Ratio: 5
Triglycerides: 127 mg/dL (ref 0.0–149.0)
VLDL: 25.4 mg/dL (ref 0.0–40.0)

## 2020-05-21 LAB — CBC
HCT: 40.9 % (ref 36.0–46.0)
Hemoglobin: 13.3 g/dL (ref 12.0–15.0)
MCHC: 32.6 g/dL (ref 30.0–36.0)
MCV: 93.4 fl (ref 78.0–100.0)
Platelets: 307 10*3/uL (ref 150.0–400.0)
RBC: 4.38 Mil/uL (ref 3.87–5.11)
RDW: 13.7 % (ref 11.5–15.5)
WBC: 7.8 10*3/uL (ref 4.0–10.5)

## 2020-05-21 LAB — TSH: TSH: 1.34 u[IU]/mL (ref 0.35–4.50)

## 2020-05-21 LAB — VITAMIN D 25 HYDROXY (VIT D DEFICIENCY, FRACTURES): VITD: 44.85 ng/mL (ref 30.00–100.00)

## 2020-05-21 LAB — HEMOGLOBIN A1C: Hgb A1c MFr Bld: 5.5 % (ref 4.6–6.5)

## 2020-05-22 LAB — COMPREHENSIVE METABOLIC PANEL
ALT: 13 U/L (ref 0–35)
AST: 14 U/L (ref 0–37)
Albumin: 4.2 g/dL (ref 3.5–5.2)
Alkaline Phosphatase: 62 U/L (ref 39–117)
BUN: 9 mg/dL (ref 6–23)
CO2: 29 mEq/L (ref 19–32)
Calcium: 9.1 mg/dL (ref 8.4–10.5)
Chloride: 104 mEq/L (ref 96–112)
Creatinine, Ser: 0.59 mg/dL (ref 0.40–1.20)
GFR: 101.02 mL/min (ref >60.00)
Glucose, Bld: 87 mg/dL (ref 70–99)
Potassium: 4.5 mEq/L (ref 3.5–5.1)
Sodium: 139 mEq/L (ref 135–145)
Total Bilirubin: 0.3 mg/dL (ref 0.2–1.2)
Total Protein: 6.8 g/dL (ref 6.0–8.3)

## 2020-06-18 ENCOUNTER — Other Ambulatory Visit: Payer: Self-pay | Admitting: Family Medicine

## 2020-06-19 NOTE — Telephone Encounter (Signed)
Last office visit- 05-05-2020 Last refill- 04-20-2020 Next office visit- not scheduled

## 2020-06-26 ENCOUNTER — Other Ambulatory Visit: Payer: Self-pay

## 2020-06-26 ENCOUNTER — Telehealth: Payer: 59 | Admitting: Internal Medicine

## 2020-06-26 NOTE — Progress Notes (Signed)
Pre visit review using our clinic review tool, if applicable. No additional management support is needed unless otherwise documented below in the visit note. 

## 2020-08-23 ENCOUNTER — Other Ambulatory Visit: Payer: Self-pay | Admitting: Family Medicine

## 2020-09-10 ENCOUNTER — Telehealth: Payer: Self-pay | Admitting: Family Medicine

## 2020-09-10 NOTE — Telephone Encounter (Signed)
Medication: Valcyclovir     Has the patient contacted their pharmacy? NO (Old prescription) (If no, request that the patient contact the pharmacy for the refill.)  Preferred Pharmacy (with phone number or street name):   [id number] - [place], [address], [place] [address]  Phone:  [phone] Fax:  [phone]  DEA #:  [id number]    Agent: Please be advised that RX refills may take up to 3 business days. We ask that you follow-up with your pharmacy.

## 2020-09-14 NOTE — Telephone Encounter (Signed)
Yes agree with refill on Valtrex

## 2020-09-14 NOTE — Telephone Encounter (Signed)
Pt previously perscribed valtrex in 2017 is it ok to renew Rx

## 2020-09-14 NOTE — Telephone Encounter (Signed)
Patient state she has vaginal blister she wants to know if you could prescribe Acyclovir. According to patient they come on and off.

## 2020-09-15 ENCOUNTER — Other Ambulatory Visit: Payer: Self-pay

## 2020-09-15 DIAGNOSIS — B001 Herpesviral vesicular dermatitis: Secondary | ICD-10-CM

## 2020-09-15 MED ORDER — ACYCLOVIR 400 MG PO TABS: 400.0000 mg | ORAL_TABLET | Freq: Three times a day (TID) | ORAL | 1 refills | Status: DC

## 2020-09-27 ENCOUNTER — Other Ambulatory Visit: Payer: Self-pay | Admitting: Family Medicine

## 2020-11-03 ENCOUNTER — Telehealth: Payer: Self-pay | Admitting: Family Medicine

## 2020-11-03 NOTE — Telephone Encounter (Signed)
Medication:  traMADol (ULTRAM) 50 MG tablet [[id number]]      Has the patient contacted their pharmacy? (If no, request that the patient contact the pharmacy for the refill.) (If yes, when and what did the pharmacy advise?)     Preferred Pharmacy (with phone number or street name):  [id number] [place], [address]  [address], [place] [address]  Phone:  [phone] Fax:  [phone]     Agent: Please be advised that RX refills may take up to 3 business days. We ask that you follow-up with your pharmacy.

## 2020-11-04 ENCOUNTER — Other Ambulatory Visit: Payer: Self-pay | Admitting: Family Medicine

## 2020-11-04 MED ORDER — TRAMADOL HCL 50 MG PO TABS: 50.0000 mg | ORAL_TABLET | Freq: Two times a day (BID) | ORAL | 0 refills | Status: DC | PRN

## 2020-11-04 NOTE — Telephone Encounter (Signed)
Requesting:traMADol (ULTRAM) 50 MG tablet Contract:05/30/17 UDS:05/30/17 Last Visit:05/30/20 Next Visit:none Last Refill:  Please Advise

## 2020-11-04 NOTE — Telephone Encounter (Signed)
I will send it in but she will need an appt by next month for any further refills as it has been almost 6 months since seen

## 2020-11-04 NOTE — Telephone Encounter (Signed)
Attempted to contact pt and inform of providers request for appointment. No answer unable to leave vm

## 2020-11-13 LAB — HM MAMMOGRAPHY

## 2020-12-31 ENCOUNTER — Other Ambulatory Visit: Payer: Self-pay | Admitting: Family Medicine

## 2020-12-31 NOTE — Telephone Encounter (Signed)
Please let her know my recommendation is to increase her daily Vitamin D supplement by 1000 IU or if she is not taking one at all start 2000 IU daily and stop the high dose. Then recheck levels in 3 months

## 2020-12-31 NOTE — Telephone Encounter (Signed)
Last vit d checked 05/21/20 and was 44.85.  Do you want to continue rx strength?

## 2021-01-01 ENCOUNTER — Other Ambulatory Visit: Payer: Self-pay | Admitting: Family Medicine

## 2021-01-01 MED ORDER — TRAMADOL HCL 50 MG PO TABS: 50.0000 mg | ORAL_TABLET | Freq: Two times a day (BID) | ORAL | 0 refills | Status: DC | PRN

## 2021-01-01 NOTE — Addendum Note (Signed)
Addended by: Sharon Seller B on: 01/01/2021 01:46 PM   Modules accepted: Orders

## 2021-01-01 NOTE — Telephone Encounter (Signed)
Called the patient informed of PCP instructions. She did verbalize understanding. Scheduled an Office visit appt with PCP for 05/20/2021.   Patient request refill on tramadol Last OV--05/05/20 Next scheduled OV--05/20/2021 Last refill--11/04/2020--#60 no refills [facility]/UDS--10/16/2018

## 2021-01-28 ENCOUNTER — Other Ambulatory Visit: Payer: Self-pay | Admitting: Family Medicine

## 2021-03-04 ENCOUNTER — Other Ambulatory Visit: Payer: Self-pay | Admitting: Family Medicine

## 2021-03-05 NOTE — Telephone Encounter (Signed)
Requesting: tramadol 50mg  Contract: 10/16/2018 UDS: 10/16/2018 Last Visit: 06/26/2020 Next Visit: 05/20/2021 Last Refill: 01/01/2021 #60 and 0RF  Please Advise

## 2021-04-27 ENCOUNTER — Telehealth: Payer: Self-pay | Admitting: Family Medicine

## 2021-04-27 NOTE — Telephone Encounter (Signed)
Would you be willing to take over refills?  Was last written by Dr. Jana Hakim.  She has a follow up with you on 05/20/21

## 2021-04-27 NOTE — Telephone Encounter (Signed)
Patient is wondering if Dr. Charlett Blake would fill gabapentin. Patient states she no longer sees the doctor who prescribed the medication.   gabapentin (NEURONTIN) 300 MG capsule [[id number]]    [id number] [place], [place] [address] [address]  Phone:  [phone] Fax:  [phone]

## 2021-04-28 NOTE — Telephone Encounter (Signed)
Patient stated she may be using mail order and will call back.  She will also need her Lexapro.  Both medications are pended until patient calls back.

## 2021-04-28 NOTE — Telephone Encounter (Signed)
Patient stated that the mail order will be contacting us about refilling medications.

## 2021-04-28 NOTE — Telephone Encounter (Signed)
Yes I am willing to take over the Gabapentin. Please send in refill for her can have 30 day supply with 5 rf or 90 with 1 rf at her discretion

## 2021-04-29 MED ORDER — GABAPENTIN 300 MG PO CAPS: ORAL_CAPSULE | ORAL | 1 refills | Status: DC

## 2021-04-29 MED ORDER — ESCITALOPRAM OXALATE 10 MG PO TABS: 10.0000 mg | ORAL_TABLET | Freq: Every day | ORAL | 1 refills | Status: DC

## 2021-04-29 NOTE — Telephone Encounter (Signed)
Received fax from [facility] and rxs sent in.  Left message on patient machine that rxs was sent in.

## 2021-05-20 ENCOUNTER — Encounter: Payer: Self-pay | Admitting: Family Medicine

## 2021-05-20 ENCOUNTER — Encounter: Payer: Self-pay | Admitting: Oncology

## 2021-05-20 ENCOUNTER — Other Ambulatory Visit: Payer: Self-pay

## 2021-05-20 ENCOUNTER — Ambulatory Visit (INDEPENDENT_AMBULATORY_CARE_PROVIDER_SITE_OTHER): Payer: Medicare Other | Admitting: Family Medicine

## 2021-05-20 VITALS — BP 132/74 | HR 96 | Temp 98.6°F | Resp 16 | Wt 155.4 lb

## 2021-05-20 DIAGNOSIS — M8580 Other specified disorders of bone density and structure, unspecified site: Secondary | ICD-10-CM | POA: Diagnosis not present

## 2021-05-20 DIAGNOSIS — Z1159 Encounter for screening for other viral diseases: Secondary | ICD-10-CM | POA: Diagnosis not present

## 2021-05-20 DIAGNOSIS — Z79899 Other long term (current) drug therapy: Secondary | ICD-10-CM

## 2021-05-20 DIAGNOSIS — M25552 Pain in left hip: Secondary | ICD-10-CM | POA: Diagnosis not present

## 2021-05-20 DIAGNOSIS — B001 Herpesviral vesicular dermatitis: Secondary | ICD-10-CM

## 2021-05-20 DIAGNOSIS — E785 Hyperlipidemia, unspecified: Secondary | ICD-10-CM

## 2021-05-20 DIAGNOSIS — F32A Depression, unspecified: Secondary | ICD-10-CM | POA: Diagnosis not present

## 2021-05-20 DIAGNOSIS — F419 Anxiety disorder, unspecified: Secondary | ICD-10-CM

## 2021-05-20 DIAGNOSIS — R739 Hyperglycemia, unspecified: Secondary | ICD-10-CM | POA: Diagnosis not present

## 2021-05-20 DIAGNOSIS — E559 Vitamin D deficiency, unspecified: Secondary | ICD-10-CM | POA: Diagnosis not present

## 2021-05-20 NOTE — Assessment & Plan Note (Signed)
Encourage heart healthy diet such as MIND or DASH diet, increase exercise, avoid trans fats, simple carbohydrates and processed foods, consider a krill or fish or flaxseed oil cap daily.  °

## 2021-05-20 NOTE — Patient Instructions (Addendum)
Prevnar 20  Shingrix #2  Covid #4 in August  All about 2 weeks apart at the pharmacy

## 2021-05-20 NOTE — Assessment & Plan Note (Signed)
Doing well on Lexapro and is 2.5 years widowed.

## 2021-05-20 NOTE — Assessment & Plan Note (Signed)
hgba1c acceptable, minimize simple carbs. Increase exercise as tolerated.  

## 2021-05-20 NOTE — Assessment & Plan Note (Signed)
Supplement and monitor 

## 2021-05-20 NOTE — Assessment & Plan Note (Signed)
Tramadol and Gabapentin at night helps overall rest and myalgias/pain

## 2021-05-20 NOTE — Assessment & Plan Note (Signed)
Encouraged to get adequate exercise, calcium and vitamin d intake. Dexa scan next year

## 2021-05-23 NOTE — Progress Notes (Signed)
Subjective:    Patient ID: [name], female    DOB: 1951/03/10, 70 y.o.   MRN: [id number]  Chief Complaint  Patient presents with   Follow-up   Hyperlipidemia    HPI Patient is in today for follow up on chronic medical concerns. No recent febrile illness or hospitalizations. No c/o polyuria or polydipsia. She is trying to minimize her simple carbohydrates and to stay as active as tolerated. She is now 2.5 years past being widowed after 76 years of marriage. She has good support and is doing better. No new acute concerns. Denies CP/palp/SOB/HA/congestion/fevers/GI or GU c/o. Taking meds as prescribed   Past Medical History:  Diagnosis Date   Arthritis    Cancer [facility]) [id number]   Breast   Chicken pox    Colon polyps    Depression with anxiety 09/07/2012   Dyslipidemia    Elevated BP 09/09/2012   H/O tobacco use, presenting hazards to health 05/05/2011   Left hip pain 05/04/2011   Osteoporosis 10/2019   T score -2.2.  Stable from prior DEXA 2018. DEXA 2014 T score -2.5   Preventative health care 09/09/2012   Tobacco abuse 05/05/2011    Past Surgical History:  Procedure Laterality Date   APPENDECTOMY  2010   BREAST LUMPECTOMY  1998   right   BREAST SURGERY  07/08/11   right mastectomy+axlnd, reconstruction,T1cN1a,ERPR+,HER2-   BREAST SURGERY  march,2013   breast implant deflated   COLONOSCOPY     POLYPECTOMY     WRIST FRACTURE SURGERY      Family History  Problem Relation Age of Onset   Diabetes Mother        type 2   Obesity Mother    Cancer Father        ?   Heart block Sister    Heart disease Sister        s/p CABG   Heart disease Brother        s/p CABG   Colon cancer Neg Hx    Esophageal cancer Neg Hx    Rectal cancer Neg Hx    Stomach cancer Neg Hx    Colon polyps Neg Hx     Social History   Socioeconomic History   Marital status: Widowed    Spouse name: Not on file   Number of children: Not on file   Years of education: Not on file    Highest education level: Not on file  Occupational History   Not on file  Tobacco Use   Smoking status: Some Days    Pack years: 0.00   Smokeless tobacco: Never  Vaping Use   Vaping Use: Never used  Substance and Sexual Activity   Alcohol use: Yes    Alcohol/week: 0.0 standard drinks    Comment: occasionally   Drug use: No   Sexual activity: Not Currently    Comment: 1st intercourse 37 yo-5 partners  Other Topics Concern   Not on file  Social History Narrative   Not on file   Social Determinants of Health   Financial Resource Strain: Not on file  Food Insecurity: Not on file  Transportation Needs: Not on file  Physical Activity: Not on file  Stress: Not on file  Social Connections: Not on file  Intimate Partner Violence: Not on file    Outpatient Medications Prior to Visit  Medication Sig Dispense Refill   acetaminophen (TYLENOL) 500 MG tablet Take 500 mg by mouth daily as needed for moderate pain.  acyclovir (ZOVIRAX) 400 MG tablet Take 1 tablet (400 mg total) by mouth 3 (three) times daily. X 5 days 15 tablet 1   Ascorbic Acid (VITAMIN C PO) Take by mouth.     escitalopram (LEXAPRO) 10 MG tablet Take 1 tablet (10 mg total) by mouth daily. 90 tablet 1   gabapentin (NEURONTIN) 300 MG capsule TAKE ONE CAPSULE BY MOUTH AT BEDTIME 90 capsule 1   ibuprofen (ADVIL,MOTRIN) 400 MG tablet Take 400 mg by mouth every 6 (six) hours as needed for pain.     Multiple Vitamins-Minerals (CENTRUM SILVER PO) Take by mouth.     traMADol (ULTRAM) 50 MG tablet TAKE 1 TABLET (50 MG TOTAL) BY MOUTH 2 (TWO) TIMES DAILY AS NEEDED FOR MODERATE PAIN. 60 tablet 0   Multiple Vitamins-Minerals (ZINC PO) Take by mouth.     Vitamin D, Ergocalciferol, (DRISDOL) 1.25 MG (50000 UNIT) CAPS capsule TAKE 1 CAPSULE (50,000 UNITS TOTAL) BY MOUTH EVERY 7 (SEVEN) DAYS. 4 capsule 4   No facility-administered medications prior to visit.    No Known Allergies  Review of Systems  Constitutional:  Negative  for fever and malaise/fatigue.  HENT:  Negative for congestion.   Eyes:  Negative for blurred vision.  Respiratory:  Negative for shortness of breath.   Cardiovascular:  Negative for chest pain, palpitations and leg swelling.  Gastrointestinal:  Negative for abdominal pain, blood in stool and nausea.  Genitourinary:  Negative for dysuria and frequency.  Musculoskeletal:  Negative for falls.  Skin:  Negative for rash.  Neurological:  Negative for dizziness, loss of consciousness and headaches.  Endo/Heme/Allergies:  Negative for environmental allergies.  Psychiatric/Behavioral:  Negative for depression. The patient is not nervous/anxious.       Objective:    Physical Exam Constitutional:      General: She is not in acute distress.    Appearance: Normal appearance. She is not ill-appearing or toxic-appearing.  HENT:     Head: Normocephalic and atraumatic.     Right Ear: External ear normal.     Left Ear: External ear normal.     Nose: Nose normal.  Eyes:     General:        Right eye: No discharge.        Left eye: No discharge.  Pulmonary:     Effort: Pulmonary effort is normal.  Skin:    Findings: No rash.  Neurological:     Mental Status: She is alert and oriented to person, place, and time.  Psychiatric:        Behavior: Behavior normal.    BP 132/74   Pulse 96   Temp 98.6 F (37 C)   Resp 16   Wt 155 lb 6.4 oz (70.5 kg)   SpO2 97%   BMI 25.08 kg/m  Wt Readings from Last 3 Encounters:  05/20/21 155 lb 6.4 oz (70.5 kg)  09/19/19 160 lb 12.8 oz (72.9 kg)  09/04/19 160 lb 12.8 oz (72.9 kg)    Diabetic Foot Exam - Simple   No data filed    Lab Results  Component Value Date   WBC 7.8 05/21/2020   HGB 13.3 05/21/2020   HCT 40.9 05/21/2020   PLT 307.0 05/21/2020   GLUCOSE 87 05/21/2020   CHOL 202 (H) 05/21/2020   TRIG 127.0 05/21/2020   HDL 43.90 05/21/2020   LDLDIRECT 149.1 05/10/2011   LDLCALC 133 (H) 05/21/2020   ALT 13 05/21/2020   AST 14  05/21/2020   NA 139  05/21/2020   K 4.5 05/21/2020   CL 104 05/21/2020   CREATININE 0.59 05/21/2020   BUN 9 05/21/2020   CO2 29 05/21/2020   TSH 1.34 05/21/2020   HGBA1C 5.5 05/21/2020    Lab Results  Component Value Date   TSH 1.34 05/21/2020   Lab Results  Component Value Date   WBC 7.8 05/21/2020   HGB 13.3 05/21/2020   HCT 40.9 05/21/2020   MCV 93.4 05/21/2020   PLT 307.0 05/21/2020   Lab Results  Component Value Date   NA 139 05/21/2020   K 4.5 05/21/2020   CHLORIDE 106 09/01/2016   CO2 29 05/21/2020   GLUCOSE 87 05/21/2020   BUN 9 05/21/2020   CREATININE 0.59 05/21/2020   BILITOT 0.3 05/21/2020   ALKPHOS 62 05/21/2020   AST 14 05/21/2020   ALT 13 05/21/2020   PROT 6.8 05/21/2020   ALBUMIN 4.2 05/21/2020   CALCIUM 9.1 05/21/2020   ANIONGAP 9 09/01/2016   EGFR >90 09/01/2016   GFR 101.02 05/21/2020   Lab Results  Component Value Date   CHOL 202 (H) 05/21/2020   Lab Results  Component Value Date   HDL 43.90 05/21/2020   Lab Results  Component Value Date   LDLCALC 133 (H) 05/21/2020   Lab Results  Component Value Date   TRIG 127.0 05/21/2020   Lab Results  Component Value Date   CHOLHDL 5 05/21/2020   Lab Results  Component Value Date   HGBA1C 5.5 05/21/2020       Assessment & Plan:   Problem List Items Addressed This Visit     Left hip pain    Tramadol and Gabapentin at night helps overall rest and myalgias/pain       Dyslipidemia    Encourage heart healthy diet such as MIND or DASH diet, increase exercise, avoid trans fats, simple carbohydrates and processed foods, consider a krill or fish or flaxseed oil cap daily.        Osteopenia    Encouraged to get adequate exercise, calcium and vitamin d intake. Dexa scan next year       Recurrent cold sores    No recent flares       Anxiety and depression    Doing well on Lexapro and is 2.5 years widowed.       Vitamin D deficiency    Supplement and monitor        Relevant Orders   Vitamin D 1,25 dihydroxy   Hyperglycemia    hgba1c acceptable, minimize simple carbs. Increase exercise as tolerated.        Other Visit Diagnoses     High risk medication use    -  Primary   Relevant Orders   DRUG MONITORING, PANEL 8 WITH CONFIRMATION, URINE   Encounter for hepatitis C screening test for low risk patient       Relevant Orders   Hepatitis C Antibody (Completed)       I have discontinued Etter M. Budney's Multiple Vitamins-Minerals (ZINC PO) and Vitamin D (Ergocalciferol). I am also having her maintain her ibuprofen, acetaminophen, Multiple Vitamins-Minerals (CENTRUM SILVER PO), Ascorbic Acid (VITAMIN C PO), acyclovir, traMADol, escitalopram, and gabapentin.  No orders of the defined types were placed in this encounter.    Penni Homans, MD

## 2021-05-23 NOTE — Assessment & Plan Note (Signed)
No recent flares 

## 2021-05-25 ENCOUNTER — Encounter: Payer: Self-pay | Admitting: *Deleted

## 2021-05-25 LAB — VITAMIN D 1,25 DIHYDROXY
Vitamin D 1, 25 (OH)2 Total: 50 pg/mL (ref 18–72)
Vitamin D2 1, 25 (OH)2: 22 pg/mL
Vitamin D3 1, 25 (OH)2: 28 pg/mL

## 2021-05-25 LAB — DRUG MONITORING, PANEL 8 WITH CONFIRMATION, URINE
6 Acetylmorphine: NEGATIVE ng/mL (ref <10)
Alcohol Metabolites: NEGATIVE ng/mL (ref <500)
Amphetamines: NEGATIVE ng/mL (ref <500)
Benzodiazepines: NEGATIVE ng/mL (ref <100)
Buprenorphine, Urine: NEGATIVE ng/mL (ref <5)
Cocaine Metabolite: NEGATIVE ng/mL (ref <150)
Creatinine: 37.6 mg/dL (ref >=20.0)
MDMA: NEGATIVE ng/mL (ref <500)
Marijuana Metabolite: NEGATIVE ng/mL (ref <20)
Opiates: NEGATIVE ng/mL (ref <100)
Oxidant: NEGATIVE ug/mL (ref <200)
Oxycodone: NEGATIVE ng/mL (ref <100)
pH: 6.9 (ref 4.5–9.0)

## 2021-05-25 LAB — HEPATITIS C ANTIBODY
Hepatitis C Ab: NONREACTIVE
SIGNAL TO CUT-OFF: 0.01 (ref <1.00)

## 2021-05-25 LAB — DM TEMPLATE

## 2021-06-03 DIAGNOSIS — Z20822 Contact with and (suspected) exposure to covid-19: Secondary | ICD-10-CM | POA: Diagnosis not present

## 2021-06-03 DIAGNOSIS — J302 Other seasonal allergic rhinitis: Secondary | ICD-10-CM | POA: Diagnosis not present

## 2021-07-04 ENCOUNTER — Other Ambulatory Visit: Payer: Self-pay | Admitting: Family Medicine

## 2021-07-05 NOTE — Telephone Encounter (Signed)
Requesting: tramadol '50mg'$   Contract: 05/20/2021 UDS: 05/20/2021 Last Visit: 05/20/2021 Next Visit: None Last Refill: 03/05/2021 #60 and 0RF  Please Advise

## 2021-07-19 DIAGNOSIS — U071 COVID-19: Secondary | ICD-10-CM | POA: Diagnosis not present

## 2021-09-07 ENCOUNTER — Other Ambulatory Visit: Payer: Self-pay | Admitting: Family Medicine

## 2021-09-07 ENCOUNTER — Telehealth: Payer: Self-pay | Admitting: Family Medicine

## 2021-09-07 NOTE — Telephone Encounter (Signed)
Requesting: tramadol Contract: 05/20/21 UDS: 05/20/21 Last Visit: 05/20/21 Next Visit: none Last Refill: 07/05/21  Please Advise

## 2021-09-07 NOTE — Telephone Encounter (Signed)
Dr.Mohorn and [facility] called and stated medical clearance form that was faxed back to them didn't include the information that was requested by them via fax. They will need that information re-faxed with the correct information requested.  Fax: [phone]

## 2021-09-14 NOTE — Telephone Encounter (Signed)
No other number left

## 2021-09-15 ENCOUNTER — Other Ambulatory Visit: Payer: Self-pay | Admitting: Nurse Practitioner

## 2021-09-23 ENCOUNTER — Other Ambulatory Visit: Payer: Self-pay | Admitting: Family Medicine

## 2021-09-28 ENCOUNTER — Other Ambulatory Visit: Payer: Self-pay | Admitting: Family Medicine

## 2021-09-28 ENCOUNTER — Telehealth: Payer: Self-pay | Admitting: Family Medicine

## 2021-09-28 MED ORDER — ALPRAZOLAM 0.25 MG PO TABS: 0.2500 mg | ORAL_TABLET | Freq: Two times a day (BID) | ORAL | 0 refills | Status: DC | PRN

## 2021-09-28 NOTE — Telephone Encounter (Signed)
Dr. Heywood Footman is still inquiring medical clearance forms, they need information on Zoledronic Acid (ZOMETA) 4 MG/100ML IVPB [[id number]] and when it was prescribe and how long she took it since it may interfere with her surgery. Fax:[phone] can also contact nancy [phone]

## 2021-09-28 NOTE — Telephone Encounter (Signed)
Pt stated she will be having a dentist appointment for teeth extraction and would like to know if xanax can be prescribed to elevate pain being that she will be awake during surgery. Please advise.

## 2021-09-29 NOTE — Telephone Encounter (Signed)
Pt.notified

## 2021-09-30 NOTE — Telephone Encounter (Signed)
Spoke with someone at Dr. Glennon Hamilton office to let them know Dr. Charlett Blake never prescribed the medication for pt and that Dr. Sarajane Jews did.

## 2021-11-09 ENCOUNTER — Other Ambulatory Visit: Payer: Self-pay | Admitting: Family Medicine

## 2021-11-10 NOTE — Telephone Encounter (Signed)
Patient scheduled for 02/28/22.  No appts available sooner.  Patient advised that she can call to see if there are any cancellations is she would like.

## 2021-11-10 NOTE — Telephone Encounter (Signed)
Requesting: tramadol Contract: 05/20/21 UDS: 05/20/21  Last Visit: 05/20/21 Next Visit: none Last Refill: 09/07/21  Please Advise

## 2021-12-01 DIAGNOSIS — Z1231 Encounter for screening mammogram for malignant neoplasm of breast: Secondary | ICD-10-CM | POA: Diagnosis not present

## 2021-12-01 LAB — HM MAMMOGRAPHY

## 2021-12-02 ENCOUNTER — Encounter: Payer: Self-pay | Admitting: *Deleted

## 2021-12-28 ENCOUNTER — Other Ambulatory Visit: Payer: Self-pay | Admitting: Family Medicine

## 2022-01-24 ENCOUNTER — Other Ambulatory Visit: Payer: Self-pay | Admitting: Family Medicine

## 2022-01-25 NOTE — Telephone Encounter (Signed)
Requesting: tramadol 50 mg Contract:05/20/21 UDS:05/20/21 Last Visit:05/20/21 Next Visit:02/28/22 Last Refill:11/10/21  Please Advise

## 2022-02-28 ENCOUNTER — Ambulatory Visit: Payer: Medicare Other | Admitting: Family Medicine

## 2022-03-10 NOTE — Progress Notes (Signed)
? ?Subjective:  ? ? Patient ID: [name], female    DOB: 02-18-1951, 71 y.o.   MRN: [id number] ? ?Chief Complaint  ?Patient presents with  ? Follow-up  ? ? ?HPI ?Patient is in today for a follow up. Overall she is doing well. No recent febrile illness or hospitalizations. She continues to smoke about a 1/2 ppd but is considering cessation. She notes some worsening dyspnea on exertion. No recent acute illness. Denies CP/palp/HA/congestion/fevers/GI or GU c/o. Taking meds as prescribed  ? ?Past Medical History:  ?Diagnosis Date  ? Arthritis   ? Cancer [facility]) [phone]  ? Breast  ? Chicken pox   ? Colon polyps   ? Depression with anxiety 09/07/2012  ? Dyslipidemia   ? Elevated BP 09/09/2012  ? H/O tobacco use, presenting hazards to health 05/05/2011  ? Left hip pain 05/04/2011  ? Osteoporosis 10/2019  ? T score -2.2.  Stable from prior DEXA 2018. DEXA 2014 T score -2.5  ? Preventative health care 09/09/2012  ? Tobacco abuse 05/05/2011  ? ? ?Past Surgical History:  ?Procedure Laterality Date  ? APPENDECTOMY  2010  ? BREAST LUMPECTOMY  1998  ? right  ? BREAST SURGERY  07/08/11  ? right mastectomy+axlnd, reconstruction,T1cN1a,ERPR+,HER2-  ? BREAST SURGERY  march,2013  ? breast implant deflated  ? COLONOSCOPY    ? POLYPECTOMY    ? WRIST FRACTURE SURGERY    ? ? ?Family History  ?Problem Relation Age of Onset  ? Diabetes Mother   ?     type 2  ? Obesity Mother   ? Cancer Father   ?     ?  ? Heart block Sister   ? Heart disease Sister   ?     s/p CABG  ? Heart disease Brother   ?     s/p CABG  ? Colon cancer Neg Hx   ? Esophageal cancer Neg Hx   ? Rectal cancer Neg Hx   ? Stomach cancer Neg Hx   ? Colon polyps Neg Hx   ? ? ?Social History  ? ?Socioeconomic History  ? Marital status: Widowed  ?  Spouse name: Not on file  ? Number of children: Not on file  ? Years of education: Not on file  ? Highest education level: Not on file  ?Occupational History  ? Not on file  ?Tobacco Use  ? Smoking status: Some Days  ? Smokeless  tobacco: Never  ?Vaping Use  ? Vaping Use: Never used  ?Substance and Sexual Activity  ? Alcohol use: Yes  ?  Alcohol/week: 0.0 standard drinks  ?  Comment: occasionally  ? Drug use: No  ? Sexual activity: Not Currently  ?  Comment: 1st intercourse 36 yo-5 partners  ?Other Topics Concern  ? Not on file  ?Social History Narrative  ? Not on file  ? ?Social Determinants of Health  ? ?Financial Resource Strain: Not on file  ?Food Insecurity: Not on file  ?Transportation Needs: Not on file  ?Physical Activity: Not on file  ?Stress: Not on file  ?Social Connections: Not on file  ?Intimate Partner Violence: Not on file  ? ? ?Outpatient Medications Prior to Visit  ?Medication Sig Dispense Refill  ? acetaminophen (TYLENOL) 500 MG tablet Take 500 mg by mouth daily as needed for moderate pain.    ? acyclovir (ZOVIRAX) 400 MG tablet Take 1 tablet (400 mg total) by mouth 3 (three) times daily. X 5 days 15 tablet 1  ?  ALPRAZolam (XANAX) 0.25 MG tablet Take 1 tablet (0.25 mg total) by mouth 2 (two) times daily as needed for anxiety. 10 tablet 0  ? Ascorbic Acid (VITAMIN C PO) Take by mouth.    ? escitalopram (LEXAPRO) 10 MG tablet TAKE 1 TABLET BY MOUTH DAILY 90 tablet 1  ? gabapentin (NEURONTIN) 300 MG capsule TAKE 1 CAPSULE BY MOUTH AT  BEDTIME 90 capsule 1  ? ibuprofen (ADVIL,MOTRIN) 400 MG tablet Take 400 mg by mouth every 6 (six) hours as needed for pain.    ? Multiple Vitamins-Minerals (CENTRUM SILVER PO) Take by mouth.    ? traMADol (ULTRAM) 50 MG tablet TAKE 1 TABLET BY MOUTH TWICE A DAY AS NEEDED FOR MODERATE PAIN 60 tablet 0  ? ?No facility-administered medications prior to visit.  ? ? ?No Known Allergies ? ?Review of Systems  ?Constitutional:  Positive for malaise/fatigue. Negative for fever.  ?HENT:  Negative for congestion.   ?Eyes:  Negative for blurred vision.  ?Respiratory:  Positive for shortness of breath.   ?Cardiovascular:  Negative for chest pain, palpitations and leg swelling.  ?Gastrointestinal:  Negative  for abdominal pain, blood in stool and nausea.  ?Genitourinary:  Negative for dysuria and frequency.  ?Musculoskeletal:  Negative for falls.  ?Skin:  Negative for rash.  ?Neurological:  Negative for dizziness, loss of consciousness and headaches.  ?Endo/Heme/Allergies:  Negative for environmental allergies.  ?Psychiatric/Behavioral:  Negative for depression. The patient is not nervous/anxious.   ? ?   ?Objective:  ?  ?Physical Exam ?Constitutional:   ?   General: She is not in acute distress. ?   Appearance: She is well-developed.  ?HENT:  ?   Head: Normocephalic and atraumatic.  ?Eyes:  ?   Conjunctiva/sclera: Conjunctivae normal.  ?Neck:  ?   Thyroid: No thyromegaly.  ?Cardiovascular:  ?   Rate and Rhythm: Normal rate and regular rhythm.  ?   Heart sounds: Normal heart sounds. No murmur heard. ?Pulmonary:  ?   Effort: Pulmonary effort is normal. No respiratory distress.  ?   Breath sounds: Normal breath sounds.  ?Abdominal:  ?   General: Bowel sounds are normal. There is no distension.  ?   Palpations: Abdomen is soft. There is no mass.  ?   Tenderness: There is no abdominal tenderness.  ?Musculoskeletal:  ?   Cervical back: Neck supple.  ?Lymphadenopathy:  ?   Cervical: No cervical adenopathy.  ?Skin: ?   General: Skin is warm and dry.  ?Neurological:  ?   Mental Status: She is alert and oriented to person, place, and time.  ?Psychiatric:     ?   Behavior: Behavior normal.  ? ? ?BP 118/82 (BP Location: Left Arm, Patient Position: Sitting, Cuff Size: Normal)   Pulse 80   Resp 20   Ht $R'5\' 6"'iw$  (1.676 m)   Wt 152 lb 12.8 oz (69.3 kg)   SpO2 97%   BMI 24.66 kg/m?  ?Wt Readings from Last 3 Encounters:  ?03/11/22 152 lb 12.8 oz (69.3 kg)  ?05/20/21 155 lb 6.4 oz (70.5 kg)  ?09/19/19 160 lb 12.8 oz (72.9 kg)  ? ? ?Diabetic Foot Exam - Simple   ?No data filed ?  ? ?Lab Results  ?Component Value Date  ? WBC 7.0 03/11/2022  ? HGB 13.9 03/11/2022  ? HCT 41.1 03/11/2022  ? PLT 324.0 03/11/2022  ? GLUCOSE 91 03/11/2022  ?  CHOL 220 (H) 03/11/2022  ? TRIG 141.0 03/11/2022  ? HDL 41.40 03/11/2022  ?  LDLDIRECT 149.1 05/10/2011  ? LDLCALC 150 (H) 03/11/2022  ? ALT 10 03/11/2022  ? AST 13 03/11/2022  ? NA 136 03/11/2022  ? K 4.3 03/11/2022  ? CL 101 03/11/2022  ? CREATININE 0.59 03/11/2022  ? BUN 11 03/11/2022  ? CO2 30 03/11/2022  ? TSH 2.34 03/11/2022  ? HGBA1C 5.6 03/11/2022  ? ? ?Lab Results  ?Component Value Date  ? TSH 2.34 03/11/2022  ? ?Lab Results  ?Component Value Date  ? WBC 7.0 03/11/2022  ? HGB 13.9 03/11/2022  ? HCT 41.1 03/11/2022  ? MCV 90.0 03/11/2022  ? PLT 324.0 03/11/2022  ? ?Lab Results  ?Component Value Date  ? NA 136 03/11/2022  ? K 4.3 03/11/2022  ? CHLORIDE 106 09/01/2016  ? CO2 30 03/11/2022  ? GLUCOSE 91 03/11/2022  ? BUN 11 03/11/2022  ? CREATININE 0.59 03/11/2022  ? BILITOT 0.3 03/11/2022  ? ALKPHOS 67 03/11/2022  ? AST 13 03/11/2022  ? ALT 10 03/11/2022  ? PROT 6.6 03/11/2022  ? ALBUMIN 4.2 03/11/2022  ? CALCIUM 9.2 03/11/2022  ? ANIONGAP 9 09/01/2016  ? EGFR >90 09/01/2016  ? GFR 90.97 03/11/2022  ? ?Lab Results  ?Component Value Date  ? CHOL 220 (H) 03/11/2022  ? ?Lab Results  ?Component Value Date  ? HDL 41.40 03/11/2022  ? ?Lab Results  ?Component Value Date  ? LDLCALC 150 (H) 03/11/2022  ? ?Lab Results  ?Component Value Date  ? TRIG 141.0 03/11/2022  ? ?Lab Results  ?Component Value Date  ? CHOLHDL 5 03/11/2022  ? ?Lab Results  ?Component Value Date  ? HGBA1C 5.6 03/11/2022  ? ? ?   ?Assessment & Plan:  ? ?Problem List Items Addressed This Visit   ? ? Dyslipidemia  ?  Encourage heart healthy diet such as MIND or DASH diet, increase exercise, avoid trans fats, simple carbohydrates and processed foods, consider a krill or fish or flaxseed oil cap daily.  ? ?  ?  ? Relevant Orders  ? TSH (Completed)  ? Lipid panel (Completed)  ? Osteopenia  ? Hypokalemia  ? Relevant Orders  ? TSH (Completed)  ? Comprehensive metabolic panel (Completed)  ? Recurrent cold sores  ? Relevant Orders  ? CBC (Completed)  ? Anxiety  and depression  ? Vitamin D deficiency  ?  Supplement and monitor ? ?  ?  ? Relevant Orders  ? VITAMIN D 25 Hydroxy (Vit-D Deficiency, Fractures) (Completed)  ? Hyperglycemia  ?  hgba1c acceptable, minimize simple

## 2022-03-11 ENCOUNTER — Encounter: Payer: Self-pay | Admitting: Family Medicine

## 2022-03-11 ENCOUNTER — Ambulatory Visit (INDEPENDENT_AMBULATORY_CARE_PROVIDER_SITE_OTHER): Payer: Medicare Other | Admitting: Family Medicine

## 2022-03-11 VITALS — BP 118/82 | HR 80 | Resp 20 | Ht 66.0 in | Wt 152.8 lb

## 2022-03-11 DIAGNOSIS — E559 Vitamin D deficiency, unspecified: Secondary | ICD-10-CM | POA: Diagnosis not present

## 2022-03-11 DIAGNOSIS — B001 Herpesviral vesicular dermatitis: Secondary | ICD-10-CM

## 2022-03-11 DIAGNOSIS — R0600 Dyspnea, unspecified: Secondary | ICD-10-CM | POA: Diagnosis not present

## 2022-03-11 DIAGNOSIS — M8580 Other specified disorders of bone density and structure, unspecified site: Secondary | ICD-10-CM | POA: Diagnosis not present

## 2022-03-11 DIAGNOSIS — F17200 Nicotine dependence, unspecified, uncomplicated: Secondary | ICD-10-CM

## 2022-03-11 DIAGNOSIS — F419 Anxiety disorder, unspecified: Secondary | ICD-10-CM | POA: Diagnosis not present

## 2022-03-11 DIAGNOSIS — R739 Hyperglycemia, unspecified: Secondary | ICD-10-CM | POA: Diagnosis not present

## 2022-03-11 DIAGNOSIS — E876 Hypokalemia: Secondary | ICD-10-CM | POA: Diagnosis not present

## 2022-03-11 DIAGNOSIS — Z79899 Other long term (current) drug therapy: Secondary | ICD-10-CM

## 2022-03-11 DIAGNOSIS — E785 Hyperlipidemia, unspecified: Secondary | ICD-10-CM | POA: Diagnosis not present

## 2022-03-11 DIAGNOSIS — F32A Depression, unspecified: Secondary | ICD-10-CM

## 2022-03-11 LAB — CBC
HCT: 41.1 % (ref 36.0–46.0)
Hemoglobin: 13.9 g/dL (ref 12.0–15.0)
MCHC: 33.9 g/dL (ref 30.0–36.0)
MCV: 90 fl (ref 78.0–100.0)
Platelets: 324 10*3/uL (ref 150.0–400.0)
RBC: 4.56 Mil/uL (ref 3.87–5.11)
RDW: 13.8 % (ref 11.5–15.5)
WBC: 7 10*3/uL (ref 4.0–10.5)

## 2022-03-11 LAB — COMPREHENSIVE METABOLIC PANEL
ALT: 10 U/L (ref 0–35)
AST: 13 U/L (ref 0–37)
Albumin: 4.2 g/dL (ref 3.5–5.2)
Alkaline Phosphatase: 67 U/L (ref 39–117)
BUN: 11 mg/dL (ref 6–23)
CO2: 30 mEq/L (ref 19–32)
Calcium: 9.2 mg/dL (ref 8.4–10.5)
Chloride: 101 mEq/L (ref 96–112)
Creatinine, Ser: 0.59 mg/dL (ref 0.40–1.20)
GFR: 90.97 mL/min (ref >60.00)
Glucose, Bld: 91 mg/dL (ref 70–99)
Potassium: 4.3 mEq/L (ref 3.5–5.1)
Sodium: 136 mEq/L (ref 135–145)
Total Bilirubin: 0.3 mg/dL (ref 0.2–1.2)
Total Protein: 6.6 g/dL (ref 6.0–8.3)

## 2022-03-11 LAB — HEMOGLOBIN A1C: Hgb A1c MFr Bld: 5.6 % (ref 4.6–6.5)

## 2022-03-11 LAB — LIPID PANEL
Cholesterol: 220 mg/dL — ABNORMAL HIGH (ref 0–200)
HDL: 41.4 mg/dL (ref >39.00)
LDL Cholesterol: 150 mg/dL — ABNORMAL HIGH (ref 0–99)
NonHDL: 178.2
Total CHOL/HDL Ratio: 5
Triglycerides: 141 mg/dL (ref 0.0–149.0)
VLDL: 28.2 mg/dL (ref 0.0–40.0)

## 2022-03-11 LAB — TSH: TSH: 2.34 u[IU]/mL (ref 0.35–5.50)

## 2022-03-11 LAB — VITAMIN D 25 HYDROXY (VIT D DEFICIENCY, FRACTURES): VITD: 21.66 ng/mL — ABNORMAL LOW (ref 30.00–100.00)

## 2022-03-11 MED ORDER — NICOTINE 14 MG/24HR TD PT24: 14.0000 mg | MEDICATED_PATCH | Freq: Every day | TRANSDERMAL | 1 refills | Status: DC

## 2022-03-11 MED ORDER — NICOTINE 7 MG/24HR TD PT24: 7.0000 mg | MEDICATED_PATCH | Freq: Every day | TRANSDERMAL | 1 refills | Status: DC

## 2022-03-11 NOTE — Assessment & Plan Note (Signed)
Supplement and monitor 

## 2022-03-11 NOTE — Assessment & Plan Note (Signed)
Encourage heart healthy diet such as MIND or DASH diet, increase exercise, avoid trans fats, simple carbohydrates and processed foods, consider a krill or fish or flaxseed oil cap daily.  °

## 2022-03-11 NOTE — Assessment & Plan Note (Signed)
hgba1c acceptable, minimize simple carbs. Increase exercise as tolerated.  

## 2022-03-11 NOTE — Patient Instructions (Signed)
Shortness of Breath, Adult Shortness of breath is when a person has trouble breathing or when a person feels like she or he is having trouble breathing in enough air. Shortness of breath could be a sign of a medical problem. Follow these instructions at home:  Pollutants Do not use any products that contain nicotine or tobacco. These products include cigarettes, chewing tobacco, and vaping devices, such as e-cigarettes. This also includes cigars and pipes. If you need help quitting, ask your health care provider. Avoid things that can irritate your airways, including: Smoke. This includes campfire smoke, forest fire smoke, and secondhand smoke from tobacco products. Do not smoke or allow others to smoke in your home. Mold. Dust. Air pollution. Chemical fumes. Things that can give you an allergic reaction (allergens) if you have allergies. Common allergens include pollen from grasses or trees and animal dander. Keep your living space clean and free of mold and dust. General instructions Pay attention to any changes in your symptoms. Take over-the-counter and prescription medicines only as told by your health care provider. This includes oxygen therapy and inhaled medicines. Rest as needed. Return to your normal activities as told by your health care provider. Ask your health care provider what activities are safe for you. Keep all follow-up visits. This is important. Contact a health care provider if: Your condition does not improve as soon as expected. You have a hard time doing your normal activities, even after you rest. You have new symptoms. You cannot walk up stairs or exercise the way that you normally do. Get help right away if: Your shortness of breath gets worse. You have shortness of breath when you are resting. You feel light-headed or you faint. You have a cough that is not controlled with medicines. You cough up blood. You have pain with breathing. You have pain in your  chest, arms, shoulders, or abdomen. You have a fever. These symptoms may be an emergency. Get help right away. Call 911. Do not wait to see if the symptoms will go away. Do not drive yourself to the hospital. Summary Shortness of breath is when a person has trouble breathing enough air. It can be a sign of a medical problem. Avoid things that irritate your lungs, such as smoking, pollution, mold, and dust. Pay attention to changes in your symptoms and contact your health care provider if you have a hard time completing daily activities because of shortness of breath. This information is not intended to replace advice given to you by your health care provider. Make sure you discuss any questions you have with your health care provider. Document Revised: 07/03/2021 Document Reviewed: 07/03/2021 Elsevier Patient Education  2023 Elsevier Inc.  

## 2022-03-13 LAB — DRUG MONITORING, PANEL 8 WITH CONFIRMATION, URINE
6 Acetylmorphine: NEGATIVE ng/mL (ref <10)
Alcohol Metabolites: NEGATIVE ng/mL (ref <500)
Amphetamines: NEGATIVE ng/mL (ref <500)
Benzodiazepines: NEGATIVE ng/mL (ref <100)
Buprenorphine, Urine: NEGATIVE ng/mL (ref <5)
Cocaine Metabolite: NEGATIVE ng/mL (ref <150)
Codeine: NEGATIVE ng/mL (ref <50)
Creatinine: 123.7 mg/dL (ref >=20.0)
Hydrocodone: NEGATIVE ng/mL (ref <50)
Hydromorphone: NEGATIVE ng/mL (ref <50)
MDMA: NEGATIVE ng/mL (ref <500)
Marijuana Metabolite: NEGATIVE ng/mL (ref <20)
Morphine: NEGATIVE ng/mL (ref <50)
Norhydrocodone: NEGATIVE ng/mL (ref <50)
Opiates: NEGATIVE ng/mL (ref <100)
Oxidant: NEGATIVE ug/mL (ref <200)
Oxycodone: NEGATIVE ng/mL (ref <100)
pH: 5.3 (ref 4.5–9.0)

## 2022-03-13 LAB — DM TEMPLATE

## 2022-03-14 DIAGNOSIS — R0600 Dyspnea, unspecified: Secondary | ICD-10-CM | POA: Insufficient documentation

## 2022-03-14 DIAGNOSIS — F17200 Nicotine dependence, unspecified, uncomplicated: Secondary | ICD-10-CM | POA: Insufficient documentation

## 2022-03-14 NOTE — Assessment & Plan Note (Signed)
Check echo, attempt smoking cessation and referred to cardiology for further consideration ?

## 2022-03-14 NOTE — Assessment & Plan Note (Signed)
Encouraged complete cessation and patient some what motivated to try, given a prescription for Nicotine patches 14 mg dose daily for one to two months and then 7 mg daily x one to two months and hopefully stop smoking ?

## 2022-03-20 ENCOUNTER — Other Ambulatory Visit: Payer: Self-pay | Admitting: Family Medicine

## 2022-03-31 ENCOUNTER — Ambulatory Visit: Payer: Medicare Other | Admitting: Cardiovascular Disease

## 2022-04-01 ENCOUNTER — Ambulatory Visit (HOSPITAL_BASED_OUTPATIENT_CLINIC_OR_DEPARTMENT_OTHER): Admission: RE | Admit: 2022-04-01 | Payer: Medicare Other | Source: Ambulatory Visit

## 2022-04-03 ENCOUNTER — Other Ambulatory Visit: Payer: Self-pay | Admitting: Family Medicine

## 2022-04-04 NOTE — Telephone Encounter (Signed)
Requesting: tramadol '50mg'$   ?Contract: 03/10/22 ?UDS: 03/11/22 ?Last Visit: 03/11/22  ?Next Visit: None ?Last Refill: 01/25/22 #60 and 0RF ? ?Please Advise ? ?

## 2022-04-05 ENCOUNTER — Ambulatory Visit: Payer: Medicare Other | Admitting: Cardiovascular Disease

## 2022-05-19 ENCOUNTER — Telehealth: Payer: Self-pay | Admitting: Family Medicine

## 2022-05-19 NOTE — Telephone Encounter (Signed)
Left message for patient to call back and schedule Medicare Annual Wellness Visit (AWV).   Please offer to do virtually or by telephone.  Left office number and my jabber [phone].  Last AWV:03/29/2016  Please schedule at anytime with Nurse Health Advisor.

## 2022-06-02 ENCOUNTER — Other Ambulatory Visit: Payer: Self-pay | Admitting: Family Medicine

## 2022-06-05 ENCOUNTER — Other Ambulatory Visit: Payer: Self-pay | Admitting: Family Medicine

## 2022-06-06 NOTE — Telephone Encounter (Signed)
Requesting: tramadol '50mg'$   Contract: 03/10/22 UDS: 03/11/22 Last Visit: 03/11/22 Next Visit: None Last Refill: 04/04/22 #60 and 0RF  Please Advise

## 2022-06-08 ENCOUNTER — Other Ambulatory Visit: Payer: Self-pay | Admitting: Family Medicine

## 2022-06-08 NOTE — Telephone Encounter (Signed)
There was an error with e-prescription transmission. Please resend, thanks!  Requesting: tramadol '50mg'$   Contract: 03/10/22 UDS: 03/11/22 Last Visit: 03/11/22 Next Visit: None Last Refill: 04/04/22 #60 and 0RF

## 2022-07-06 ENCOUNTER — Telehealth: Payer: Self-pay | Admitting: Family Medicine

## 2022-07-06 NOTE — Telephone Encounter (Signed)
Left message for patient to call back and schedule Medicare Annual Wellness Visit (AWV).   Please offer to do virtually or by telephone.  Left office number and my jabber [phone].  Last AWV:03/29/2016  Please schedule at anytime with Nurse Health Advisor.

## 2022-08-08 ENCOUNTER — Other Ambulatory Visit: Payer: Self-pay | Admitting: Family Medicine

## 2022-08-08 NOTE — Telephone Encounter (Signed)
Requesting: tramadol '50mg'$   Contract:03/10/22 UDS:03/11/22 Last Visit: 03/11/22 Next Visit: None  Last Refill: 06/08/22 #60 and 0RF  Please Advise

## 2022-08-31 ENCOUNTER — Other Ambulatory Visit: Payer: Self-pay | Admitting: Family Medicine

## 2022-08-31 ENCOUNTER — Telehealth: Payer: Self-pay | Admitting: Family Medicine

## 2022-08-31 MED ORDER — FLUCONAZOLE 150 MG PO TABS: 150.0000 mg | ORAL_TABLET | Freq: Once | ORAL | 0 refills | Status: AC

## 2022-08-31 NOTE — Telephone Encounter (Signed)
Pt stated she would like something sent in for a vaginal infection. She stated in the past she has been given 1 pill that cleared everything up and she would like that again. She declined an appt and wanted to see what pcp says. She stated it is a bit itchy and uncomfortable.    [id number]- [place], [place] - [address], [place] [id number]: [phone] Fax: [phone]

## 2022-09-01 NOTE — Telephone Encounter (Signed)
Called pt was advised  

## 2022-10-07 ENCOUNTER — Telehealth: Payer: Self-pay | Admitting: Family Medicine

## 2022-10-11 ENCOUNTER — Other Ambulatory Visit: Payer: Self-pay | Admitting: Family Medicine

## 2022-10-11 MED ORDER — TRAMADOL HCL 50 MG PO TABS: ORAL_TABLET | ORAL | 0 refills | Status: DC

## 2022-10-11 NOTE — Telephone Encounter (Signed)
Pt called to follow up on reason for refill denial for tramadol. After reviewing chart, noticed pt has not been seen for a visit since 4.14.23. Unsure if pt needs appt for further refills. Please advise.

## 2022-10-12 NOTE — Telephone Encounter (Signed)
Called pt was advised need appt follow up  To call our office to make appt

## 2022-10-17 ENCOUNTER — Encounter: Payer: Self-pay | Admitting: Obstetrics and Gynecology

## 2022-10-18 NOTE — Progress Notes (Signed)
71 y.o. G2P2 Widowed Caucasian female here for NEW gyn/ annual exam.  Wants to discuss vaginal odor and itching, worried about PH imbalance with baths. Taking bubble baths.   Denies dysuria.   Retired last year.   PCP:   Gwyneth Revels, MD  No LMP recorded. Patient is postmenopausal.           Sexually active: No.  The current method of family planning is post menopausal status.    Exercising: Yes.    Gym/ health club routine includes cardio. Smoker:  yes, some days  Health Maintenance: Pap:  09/29/17 negative HR HPV negative, 02/26/15 negative  History of abnormal Pap:  no MMG:  12/01/21, Breast Composition Category C, BI-RADS CATEGORY 1 Negative Colonoscopy:  09/19/19 (every 3 years) BMD:   11/13/19  Result  osteopenia TDaP:  05/01/18 Gardasil:   no Screening Labs:  PCP   reports that she has been smoking. She has never used smokeless tobacco. She reports current alcohol use. She reports that she does not use drugs.  Past Medical History:  Diagnosis Date   Arthritis    Cancer [facility]) [id number]   Breast   Chicken pox    Colon polyps    Depression with anxiety 09/07/2012   Dyslipidemia    Elevated BP 09/09/2012   H/O tobacco use, presenting hazards to health 05/05/2011   Left hip pain 05/04/2011   Osteoporosis 10/2019   T score -2.2.  Stable from prior DEXA 2018. DEXA 2014 T score -2.5   Preventative health care 09/09/2012   Tobacco abuse 05/05/2011    Past Surgical History:  Procedure Laterality Date   APPENDECTOMY  2010   BREAST LUMPECTOMY  1998   right   BREAST SURGERY  07/08/11   right mastectomy+axlnd, reconstruction,T1cN1a,ERPR+,HER2-   BREAST SURGERY  march,2013   breast implant deflated   COLONOSCOPY     POLYPECTOMY     WRIST FRACTURE SURGERY      Current Outpatient Medications  Medication Sig Dispense Refill   acetaminophen (TYLENOL) 500 MG tablet Take 500 mg by mouth daily as needed for moderate pain.     acyclovir (ZOVIRAX) 400 MG tablet Take 1 tablet (400  mg total) by mouth 3 (three) times daily. X 5 days 15 tablet 1   ALPRAZolam (XANAX) 0.25 MG tablet Take 1 tablet (0.25 mg total) by mouth 2 (two) times daily as needed for anxiety. 10 tablet 0   Ascorbic Acid (VITAMIN C PO) Take by mouth.     escitalopram (LEXAPRO) 10 MG tablet TAKE 1 TABLET BY MOUTH DAILY 90 tablet 3   gabapentin (NEURONTIN) 300 MG capsule TAKE 1 CAPSULE BY MOUTH AT  BEDTIME 100 capsule 2   ibuprofen (ADVIL,MOTRIN) 400 MG tablet Take 400 mg by mouth every 6 (six) hours as needed for pain.     Multiple Vitamins-Minerals (CENTRUM SILVER PO) Take by mouth.     nicotine (NICODERM CQ - DOSED IN MG/24 HOURS) 14 mg/24hr patch Place 1 patch (14 mg total) onto the skin daily. 28 patch 1   nicotine (NICODERM CQ - DOSED IN MG/24 HR) 7 mg/24hr patch Place 1 patch (7 mg total) onto the skin daily. Start after completing 14 mg patches 28 patch 1   traMADol (ULTRAM) 50 MG tablet TAKE 1 TABLET BY MOUTH TWICE A DAY AS NEEDED FOR MODERATE PAIN 60 tablet 0   No current facility-administered medications for this visit.    Family History  Problem Relation Age of Onset   Diabetes Mother  type 2   Obesity Mother    Cancer Father        ?   Heart block Sister    Heart disease Sister        s/p CABG   Heart disease Brother        s/p CABG   Colon cancer Neg Hx    Esophageal cancer Neg Hx    Rectal cancer Neg Hx    Stomach cancer Neg Hx    Colon polyps Neg Hx     Review of Systems  Genitourinary:        Vaginal odor and little itching    Exam:   BP (!) 140/83 (BP Location: Left Arm, Patient Position: Sitting, Cuff Size: Normal)   Ht _0  (1.626 m)   Wt 159 lb (72.1 kg)   BMI 27.29 kg/m     General appearance: alert, cooperative and appears stated age Head: normocephalic, without obvious abnormality, atraumatic Neck: no adenopathy, supple, symmetrical, trachea midline and thyroid normal to inspection and palpation Lungs: clear to auscultation bilaterally Breasts: right  breast - absent with reconstruction present. Left breast - normal appearance, no masses or tenderness, slight nipple retraction (old change per patient).  No nipple discharge or bleeding, No axillary adenopathy Heart: regular rate and rhythm Abdomen: soft, non-tender; no masses, no organomegaly Extremities: extremities normal, atraumatic, no cyanosis or edema Skin: skin color, texture, turgor normal. No rashes or lesions Lymph nodes: cervical, supraclavicular, and axillary nodes normal. Neurologic: grossly normal  Pelvic: External genitalia: mild patch of nonspecific erythema of right perineal area.              No abnormal inguinal nodes palpated.              Urethra:  normal appearing urethra with no masses, tenderness or lesions              Bartholins and Skenes: normal                 Vagina: normal appearing vagina with normal color and discharge, no lesions              Cervix: no lesions              Pap taken: yes Bimanual Exam:  Uterus:  normal size, contour, position, consistency, mobility, non-tender              Adnexa: no mass, fullness, tenderness              Rectal exam: yes.  Confirms.              Anus:  normal sphincter tone, no lesions  Chaperone was present for exam:  Raquel Sarna  Assessment:   Well woman visit with gynecologic exam. Status post right mastectomy for breast cancer recurrence.  Had reconstruction.  Status post tamoxifen x 5 years with original cancer dx.  BRCA negative.  Left nipple inversion.  Old change.  Hx osteoporosis.  Vaginal odor.   Plan: Mammogram screening discussed.  She will schedule for January, 2024.  Self breast awareness reviewed. Pap and HR HPV collected.  Guidelines for Calcium, Vitamin D, regular exercise program including cardiovascular and weight bearing exercise. BMD recommended at [facility]. Patient will schedule. Wet prep:  negative yeast, negative clue cells, negative trichomonas. Urinalysis:  sg 1.021, ph 5.5, trace protein,  neg nitrites, 6 - 10 WBC, 3 - 10 RBC, moderate bacteria, few mucus.  Reflex culture.  Follow up annually and prn.  After visit summary provided.   30 min  total time was spent for this patient encounter, including preparation, face-to-face counseling with the patient, coordination of care, and documentation of the encounter.

## 2022-10-24 ENCOUNTER — Ambulatory Visit (INDEPENDENT_AMBULATORY_CARE_PROVIDER_SITE_OTHER): Payer: Medicare Other | Admitting: Obstetrics and Gynecology

## 2022-10-24 ENCOUNTER — Encounter: Payer: Self-pay | Admitting: Obstetrics and Gynecology

## 2022-10-24 ENCOUNTER — Other Ambulatory Visit (HOSPITAL_COMMUNITY)
Admission: RE | Admit: 2022-10-24 | Discharge: 2022-10-24 | Disposition: A | Discharge status: Home / Self Care | Payer: Medicare Other | Source: Ambulatory Visit | Attending: Obstetrics and Gynecology | Admitting: Obstetrics and Gynecology

## 2022-10-24 VITALS — BP 131/84 | Ht 64.0 in | Wt 159.0 lb

## 2022-10-24 DIAGNOSIS — N898 Other specified noninflammatory disorders of vagina: Secondary | ICD-10-CM | POA: Diagnosis not present

## 2022-10-24 DIAGNOSIS — Z8739 Personal history of other diseases of the musculoskeletal system and connective tissue: Secondary | ICD-10-CM

## 2022-10-24 DIAGNOSIS — Z01419 Encounter for gynecological examination (general) (routine) without abnormal findings: Secondary | ICD-10-CM

## 2022-10-24 DIAGNOSIS — R8290 Unspecified abnormal findings in urine: Secondary | ICD-10-CM | POA: Diagnosis not present

## 2022-10-24 DIAGNOSIS — Z1151 Encounter for screening for human papillomavirus (HPV): Secondary | ICD-10-CM | POA: Insufficient documentation

## 2022-10-24 DIAGNOSIS — Z124 Encounter for screening for malignant neoplasm of cervix: Secondary | ICD-10-CM | POA: Diagnosis present

## 2022-10-24 DIAGNOSIS — Z9189 Other specified personal risk factors, not elsewhere classified: Secondary | ICD-10-CM | POA: Diagnosis not present

## 2022-10-24 DIAGNOSIS — Z853 Personal history of malignant neoplasm of breast: Secondary | ICD-10-CM

## 2022-10-24 LAB — WET PREP FOR TRICH, YEAST, CLUE

## 2022-10-24 NOTE — Patient Instructions (Signed)

## 2022-10-26 LAB — URINALYSIS, COMPLETE W/RFL CULTURE
Casts: NONE SEEN /LPF
Crystals: NONE SEEN /HPF
Glucose, UA: NEGATIVE
Hyaline Cast: NONE SEEN /LPF
Nitrites, Initial: NEGATIVE
Specific Gravity, Urine: 1.021 (ref 1.001–1.035)
Yeast: NONE SEEN /HPF
pH: 5.5 (ref 5.0–8.0)

## 2022-10-26 LAB — CYTOLOGY - PAP
Comment: NEGATIVE
Diagnosis: NEGATIVE
High risk HPV: NEGATIVE

## 2022-10-26 LAB — CULTURE INDICATED

## 2022-10-26 LAB — URINE CULTURE
MICRO NUMBER:: 14233300
SPECIMEN QUALITY:: ADEQUATE

## 2022-11-25 ENCOUNTER — Telehealth: Payer: Self-pay | Admitting: *Deleted

## 2022-11-25 DIAGNOSIS — B001 Herpesviral vesicular dermatitis: Secondary | ICD-10-CM

## 2022-11-25 MED ORDER — VALACYCLOVIR HCL 500 MG PO TABS: 500.0000 mg | ORAL_TABLET | Freq: Two times a day (BID) | ORAL | 0 refills | Status: DC

## 2022-11-25 MED ORDER — ACYCLOVIR 5 % EX OINT: 1.0000 | TOPICAL_OINTMENT | CUTANEOUS | 0 refills | Status: DC

## 2022-11-25 NOTE — Telephone Encounter (Signed)
Patient called in triage voicemail left a message requesting refill on HSV medication. I called patient back, no voicemail setup.

## 2022-11-25 NOTE — Telephone Encounter (Signed)
Patient informed, Rx sent.  

## 2022-11-25 NOTE — Telephone Encounter (Signed)
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 Her PCP last prescribed acyclovir in 2021, Rx expired, last saw Dr.Silva on 09/2022. Please advise

## 2022-11-25 NOTE — Telephone Encounter (Signed)
Ok to send the ointment, would she like oral as well to provide fast symptom relief? If so may send valtrex '500mg'$  po BID x 3 days

## 2022-12-05 DIAGNOSIS — Z1231 Encounter for screening mammogram for malignant neoplasm of breast: Secondary | ICD-10-CM | POA: Diagnosis not present

## 2022-12-05 LAB — HM MAMMOGRAPHY

## 2022-12-08 ENCOUNTER — Encounter: Payer: Self-pay | Admitting: Family Medicine

## 2022-12-13 ENCOUNTER — Other Ambulatory Visit: Payer: Self-pay | Admitting: Family Medicine

## 2022-12-13 NOTE — Telephone Encounter (Signed)
Requesting: tramadol '50mg'$  Contract:  10/16/18 UDS: 03/11/22 Last Visit: 03/11/22 Next Visit: 03/16/23 Last Refill: 10/11/22  Called patient for next visit.   Please Advise

## 2022-12-19 ENCOUNTER — Other Ambulatory Visit: Payer: Self-pay | Admitting: Family Medicine

## 2023-02-17 ENCOUNTER — Other Ambulatory Visit: Payer: Self-pay | Admitting: Family Medicine

## 2023-02-17 NOTE — Telephone Encounter (Signed)
Requesting: tramadol 50mg   Contract: 03/10/22 UDS: 03/11/22 Last Visit: 03/11/22 Next Visit: 03/16/23 Last Refill: 12/13/22 #60 and 0RF   Please Advise

## 2023-02-28 ENCOUNTER — Other Ambulatory Visit: Payer: Self-pay | Admitting: Family Medicine

## 2023-03-15 NOTE — Assessment & Plan Note (Signed)
Encourage heart healthy diet such as MIND or DASH diet, increase exercise, avoid trans fats, simple carbohydrates and processed foods, consider a krill or fish or flaxseed oil cap daily.  °

## 2023-03-15 NOTE — Assessment & Plan Note (Signed)
Supplement and monitor 

## 2023-03-15 NOTE — Assessment & Plan Note (Signed)
Encouraged complete cessation. 

## 2023-03-15 NOTE — Assessment & Plan Note (Signed)
hgba1c acceptable, minimize simple carbs. Increase exercise as tolerated.  

## 2023-03-16 ENCOUNTER — Ambulatory Visit (INDEPENDENT_AMBULATORY_CARE_PROVIDER_SITE_OTHER): Payer: Medicare Other | Admitting: Family Medicine

## 2023-03-16 VITALS — BP 128/78 | HR 71 | Temp 97.5°F | Resp 16 | Ht 66.0 in | Wt 158.4 lb

## 2023-03-16 DIAGNOSIS — R739 Hyperglycemia, unspecified: Secondary | ICD-10-CM | POA: Diagnosis not present

## 2023-03-16 DIAGNOSIS — E785 Hyperlipidemia, unspecified: Secondary | ICD-10-CM

## 2023-03-16 DIAGNOSIS — F17200 Nicotine dependence, unspecified, uncomplicated: Secondary | ICD-10-CM

## 2023-03-16 DIAGNOSIS — Z79899 Other long term (current) drug therapy: Secondary | ICD-10-CM

## 2023-03-16 DIAGNOSIS — E2839 Other primary ovarian failure: Secondary | ICD-10-CM

## 2023-03-16 DIAGNOSIS — M8580 Other specified disorders of bone density and structure, unspecified site: Secondary | ICD-10-CM | POA: Diagnosis not present

## 2023-03-16 DIAGNOSIS — E559 Vitamin D deficiency, unspecified: Secondary | ICD-10-CM

## 2023-03-16 DIAGNOSIS — F32A Depression, unspecified: Secondary | ICD-10-CM | POA: Diagnosis not present

## 2023-03-16 DIAGNOSIS — F419 Anxiety disorder, unspecified: Secondary | ICD-10-CM

## 2023-03-16 DIAGNOSIS — Z780 Asymptomatic menopausal state: Secondary | ICD-10-CM

## 2023-03-16 LAB — CBC WITH DIFFERENTIAL/PLATELET
Basophils Absolute: 0.1 10*3/uL (ref 0.0–0.1)
Basophils Relative: 0.9 % (ref 0.0–3.0)
Eosinophils Absolute: 0.1 10*3/uL (ref 0.0–0.7)
Eosinophils Relative: 1.5 % (ref 0.0–5.0)
HCT: 40.5 % (ref 36.0–46.0)
Hemoglobin: 13.3 g/dL (ref 12.0–15.0)
Lymphocytes Relative: 22 % (ref 12.0–46.0)
Lymphs Abs: 1.8 10*3/uL (ref 0.7–4.0)
MCHC: 32.9 g/dL (ref 30.0–36.0)
MCV: 90.9 fl (ref 78.0–100.0)
Monocytes Absolute: 0.5 10*3/uL (ref 0.1–1.0)
Monocytes Relative: 5.7 % (ref 3.0–12.0)
Neutro Abs: 5.8 10*3/uL (ref 1.4–7.7)
Neutrophils Relative %: 69.9 % (ref 43.0–77.0)
Platelets: 330 10*3/uL (ref 150.0–400.0)
RBC: 4.45 Mil/uL (ref 3.87–5.11)
RDW: 13.8 % (ref 11.5–15.5)
WBC: 8.3 10*3/uL (ref 4.0–10.5)

## 2023-03-16 LAB — LIPID PANEL
Cholesterol: 202 mg/dL — ABNORMAL HIGH (ref 0–200)
HDL: 44.8 mg/dL (ref >39.00)
LDL Cholesterol: 142 mg/dL — ABNORMAL HIGH (ref 0–99)
NonHDL: 157.26
Total CHOL/HDL Ratio: 5
Triglycerides: 75 mg/dL (ref 0.0–149.0)
VLDL: 15 mg/dL (ref 0.0–40.0)

## 2023-03-16 LAB — TSH: TSH: 1.84 u[IU]/mL (ref 0.35–5.50)

## 2023-03-16 LAB — COMPREHENSIVE METABOLIC PANEL
ALT: 9 U/L (ref 0–35)
AST: 13 U/L (ref 0–37)
Albumin: 4 g/dL (ref 3.5–5.2)
Alkaline Phosphatase: 66 U/L (ref 39–117)
BUN: 10 mg/dL (ref 6–23)
CO2: 28 mEq/L (ref 19–32)
Calcium: 8.7 mg/dL (ref 8.4–10.5)
Chloride: 103 mEq/L (ref 96–112)
Creatinine, Ser: 0.5 mg/dL (ref 0.40–1.20)
GFR: 94 mL/min (ref >60.00)
Glucose, Bld: 91 mg/dL (ref 70–99)
Potassium: 4.1 mEq/L (ref 3.5–5.1)
Sodium: 139 mEq/L (ref 135–145)
Total Bilirubin: 0.4 mg/dL (ref 0.2–1.2)
Total Protein: 6.6 g/dL (ref 6.0–8.3)

## 2023-03-16 LAB — VITAMIN D 25 HYDROXY (VIT D DEFICIENCY, FRACTURES): VITD: 20.86 ng/mL — ABNORMAL LOW (ref 30.00–100.00)

## 2023-03-16 LAB — HEMOGLOBIN A1C: Hgb A1c MFr Bld: 5.6 % (ref 4.6–6.5)

## 2023-03-16 MED ORDER — ALPRAZOLAM 0.25 MG PO TABS: 0.2500 mg | ORAL_TABLET | Freq: Two times a day (BID) | ORAL | 1 refills | Status: DC | PRN

## 2023-03-16 NOTE — Assessment & Plan Note (Addendum)
Encouraged to get adequate exercise, calcium and vitamin d intake repeat Dexa scan ordered today

## 2023-03-16 NOTE — Patient Instructions (Signed)
RSV, Respiratory Syncitial Virus vaccine, Arexvy  Smoking Tobacco Information, Adult Smoking tobacco can be harmful to your health. Tobacco contains a toxic colorless chemical called nicotine. Nicotine causes changes in your brain that make you want more and more. This is called addiction. This can make it hard to stop smoking once you start. Tobacco also has other toxic chemicals that can hurt your body and raise your risk of many cancers. Menthol or "lite" tobacco or cigarette brands are not safer than regular brands. How can smoking tobacco affect me? Smoking tobacco puts you at risk for: Cancer. Smoking is most commonly associated with lung cancer, but can also lead to cancer in other parts of the body. Chronic obstructive pulmonary disease (COPD). This is a long-term lung condition that makes it hard to breathe. It also gets worse over time. High blood pressure (hypertension), heart disease, stroke, heart attack, and lung infections, such as pneumonia. Cataracts. This is when the lenses in the eyes become clouded. Digestive problems. This may include peptic ulcers, heartburn, and gastroesophageal reflux disease (GERD). Oral health problems, such as gum disease, mouth sores, and tooth loss. Loss of taste and smell. Smoking also affects how you look and smell. Smoking may cause: Wrinkles. Yellow or stained teeth, fingers, and fingernails. Bad breath. Bad-smelling clothes and hair. Smoking tobacco can also affect your social life, because: It may be challenging to find places to smoke when away from home. Many workplaces, [employer], hotels, and public places are tobacco-free. Smoking is expensive. This is due to the cost of tobacco and the long-term costs of treating health problems from smoking. Secondhand smoke may affect those around you. Secondhand smoke can cause lung cancer, breathing problems, and heart disease. Children of smokers have a higher risk for: Sudden infant death  syndrome (SIDS). Ear infections. Lung infections. What actions can I take to prevent health problems? Quit smoking  Do not start smoking. Quit if you already smoke. Do not replace cigarette smoking with vaping devices, such as e-cigarettes. Make a plan to quit smoking and commit to it. Look for programs to help you, and ask your health care provider for recommendations and ideas. Set a date and write down all the reasons you want to quit. Let your friends and family know you are quitting so they can help and support you. Consider finding friends who also want to quit. It can be easier to quit with someone else, so that you can support each other. Talk with your health care provider about using nicotine replacement medicines to help you quit. These include gum, lozenges, patches, sprays, or pills. If you try to quit but return to smoking, stay positive. It is common to slip up when you first quit, so take it one day at a time. Be prepared for cravings. When you feel the urge to smoke, chew gum or suck on hard candy. Lifestyle Stay busy. Take care of your body. Get plenty of exercise, eat a healthy diet, and drink plenty of water. Find ways to manage your stress, such as meditation, yoga, exercise, or time spent with friends and family. Ask your health care provider about having regular tests (screenings) to check for cancer. This may include blood tests, imaging tests, and other tests. Where to find support To get support to quit smoking, consider: Asking your health care provider for more information and resources. Joining a support group for people who want to quit smoking in your local community. There are many effective programs that may help  you to quit. Calling the smokefree.gov counselor helpline at 1-800-QUIT-NOW (859) 554-1457). Where to find more information You may find more information about quitting smoking from: Centers for Disease Control and Prevention:  http://www.osborne.com/ [employer]: smokefree.gov American Lung Association: freedomfromsmoking.org Contact a health care provider if: You have problems breathing. Your lips, nose, or fingers turn blue. You have chest pain. You are coughing up blood. You feel like you will faint. You have other health changes that cause you to worry. Summary Smoking tobacco can negatively affect your health, the health of those around you, your finances, and your social life. Do not start smoking. Quit if you already smoke. If you need help quitting, ask your health care provider. Consider joining a support group for people in your local community who want to quit smoking. There are many effective programs that may help you to quit. This information is not intended to replace advice given to you by your health care provider. Make sure you discuss any questions you have with your health care provider. Document Revised: 11/09/2021 Document Reviewed: 11/09/2021 Elsevier Patient Education  2023 [employer].  at pharmacy

## 2023-03-17 ENCOUNTER — Other Ambulatory Visit: Payer: Self-pay

## 2023-03-18 LAB — DRUG MONITORING PANEL 376104, URINE
Amphetamines: NEGATIVE ng/mL
Barbiturates: NEGATIVE ng/mL
Benzodiazepines: NEGATIVE ng/mL
Cocaine Metabolite: NEGATIVE ng/mL
Desmethyltramadol: 1689 ng/mL — ABNORMAL HIGH
Opiates: NEGATIVE ng/mL
Oxycodone: NEGATIVE ng/mL
Tramadol: 6472 ng/mL — ABNORMAL HIGH

## 2023-03-18 LAB — DM TEMPLATE

## 2023-03-19 ENCOUNTER — Encounter: Payer: Self-pay | Admitting: Family Medicine

## 2023-03-19 NOTE — Progress Notes (Signed)
Subjective:    Patient ID: [name], female    DOB: 1951-01-07, 72 y.o.   MRN: [id number]  Chief Complaint  Patient presents with   Follow-up    Follow up    HPI Patient is in today for follow up on chronic medical concerns. No recent febrile illness or hospitalizations. Denies CP/palp/SOB/HA/congestion/fevers/GI or GU c/o. Taking meds as prescribed. No acute concerns. She continues to maintain a heart healthy diet but also continues to smoke. Is active most days and continues to work but is planning to retire soon. No complaints of polyuria or polydipsia.   Past Medical History:  Diagnosis Date   Arthritis    Cancer [facility]) [id number]   Breast   Chicken pox    Colon polyps    Depression with anxiety 09/07/2012   Dyslipidemia    Elevated BP 09/09/2012   H/O tobacco use, presenting hazards to health 05/05/2011   Left hip pain 05/04/2011   Osteoporosis 10/2019   T score -2.2.  Stable from prior DEXA 2018. DEXA 2014 T score -2.5   Preventative health care 09/09/2012   Tobacco abuse 05/05/2011    Past Surgical History:  Procedure Laterality Date   APPENDECTOMY  2010   BREAST LUMPECTOMY  1998   right   BREAST SURGERY  07/08/11   right mastectomy+axlnd, reconstruction,T1cN1a,ERPR+,HER2-   BREAST SURGERY  march,2013   breast implant deflated   COLONOSCOPY     POLYPECTOMY     WRIST FRACTURE SURGERY      Family History  Problem Relation Age of Onset   Diabetes Mother        type 2   Obesity Mother    Cancer Father        ?   Heart block Sister    Heart disease Sister        s/p CABG   Heart disease Brother        s/p CABG   Colon cancer Neg Hx    Esophageal cancer Neg Hx    Rectal cancer Neg Hx    Stomach cancer Neg Hx    Colon polyps Neg Hx     Social History   Socioeconomic History   Marital status: Widowed    Spouse name: Not on file   Number of children: Not on file   Years of education: Not on file   Highest education level: Not on file   Occupational History   Not on file  Tobacco Use   Smoking status: Some Days   Smokeless tobacco: Never  Vaping Use   Vaping Use: Never used  Substance and Sexual Activity   Alcohol use: Yes    Alcohol/week: 0.0 standard drinks of alcohol    Comment: occasionally   Drug use: No   Sexual activity: Not Currently    Comment: 1st intercourse 53 yo-5 partners  Other Topics Concern   Not on file  Social History Narrative   Not on file   Social Determinants of Health   Financial Resource Strain: Not on file  Food Insecurity: Not on file  Transportation Needs: Not on file  Physical Activity: Not on file  Stress: Not on file  Social Connections: Not on file  Intimate Partner Violence: Not on file    Outpatient Medications Prior to Visit  Medication Sig Dispense Refill   acetaminophen (TYLENOL) 500 MG tablet Take 500 mg by mouth daily as needed for moderate pain.     acyclovir (ZOVIRAX) 400 MG tablet Take 1  tablet (400 mg total) by mouth 3 (three) times daily. X 5 days 15 tablet 1   acyclovir ointment (ZOVIRAX) 5 % Apply 1 Application topically every 3 (three) hours. 15 g 0   Ascorbic Acid (VITAMIN C PO) Take by mouth.     escitalopram (LEXAPRO) 10 MG tablet TAKE 1 TABLET BY MOUTH DAILY 90 tablet 3   gabapentin (NEURONTIN) 300 MG capsule TAKE 1 CAPSULE BY MOUTH AT  BEDTIME 100 capsule 2   ibuprofen (ADVIL,MOTRIN) 400 MG tablet Take 400 mg by mouth every 6 (six) hours as needed for pain.     Multiple Vitamins-Minerals (CENTRUM SILVER PO) Take by mouth.     nicotine (NICODERM CQ - DOSED IN MG/24 HOURS) 14 mg/24hr patch Place 1 patch (14 mg total) onto the skin daily. 28 patch 1   nicotine (NICODERM CQ - DOSED IN MG/24 HR) 7 mg/24hr patch Place 1 patch (7 mg total) onto the skin daily. Start after completing 14 mg patches 28 patch 1   traMADol (ULTRAM) 50 MG tablet TAKE 1 TABLET BY MOUTH TWICE A DAY AS NEEDED FOR MODERATE PAIN 60 tablet 0   valACYclovir (VALTREX) 500 MG tablet Take 1  tablet (500 mg total) by mouth 2 (two) times daily. For 3 days 6 tablet 0   ALPRAZolam (XANAX) 0.25 MG tablet Take 1 tablet (0.25 mg total) by mouth 2 (two) times daily as needed for anxiety. 10 tablet 0   No facility-administered medications prior to visit.    No Known Allergies  Review of Systems  Constitutional:  Negative for fever and malaise/fatigue.  HENT:  Negative for congestion.   Eyes:  Negative for blurred vision.  Respiratory:  Negative for shortness of breath.   Cardiovascular:  Negative for chest pain, palpitations and leg swelling.  Gastrointestinal:  Negative for abdominal pain, blood in stool and nausea.  Genitourinary:  Negative for dysuria and frequency.  Musculoskeletal:  Negative for falls.  Skin:  Negative for rash.  Neurological:  Negative for dizziness, loss of consciousness and headaches.  Endo/Heme/Allergies:  Negative for environmental allergies.  Psychiatric/Behavioral:  Negative for depression. The patient is nervous/anxious.        Objective:    Physical Exam Constitutional:      General: She is not in acute distress.    Appearance: Normal appearance. She is well-developed. She is not toxic-appearing.  HENT:     Head: Normocephalic and atraumatic.     Right Ear: External ear normal.     Left Ear: External ear normal.     Nose: Nose normal.  Eyes:     General:        Right eye: No discharge.        Left eye: No discharge.     Conjunctiva/sclera: Conjunctivae normal.  Neck:     Thyroid: No thyromegaly.  Cardiovascular:     Rate and Rhythm: Normal rate and regular rhythm.     Heart sounds: Normal heart sounds. No murmur heard. Pulmonary:     Effort: Pulmonary effort is normal. No respiratory distress.     Breath sounds: Normal breath sounds.  Abdominal:     General: Bowel sounds are normal.     Palpations: Abdomen is soft.     Tenderness: There is no abdominal tenderness. There is no guarding.  Musculoskeletal:        General: Normal  range of motion.     Cervical back: Neck supple.  Lymphadenopathy:     Cervical: No cervical adenopathy.  Skin:    General: Skin is warm and dry.  Neurological:     Mental Status: She is alert and oriented to person, place, and time.  Psychiatric:        Mood and Affect: Mood normal.        Behavior: Behavior normal.        Thought Content: Thought content normal.        Judgment: Judgment normal.     BP 128/78 (BP Location: Right Arm, Patient Position: Sitting, Cuff Size: Normal)   Pulse 71   Temp (!) 97.5 F (36.4 C) (Oral)   Resp 16   Ht  (1.676 m)   Wt 158 lb 6.4 oz (71.8 kg)   SpO2 95%   BMI 25.57 kg/m  Wt Readings from Last 3 Encounters:  03/16/23 158 lb 6.4 oz (71.8 kg)  10/24/22 159 lb (72.1 kg)  03/11/22 152 lb 12.8 oz (69.3 kg)    Diabetic Foot Exam - Simple   No data filed    Lab Results  Component Value Date   WBC 8.3 03/16/2023   HGB 13.3 03/16/2023   HCT 40.5 03/16/2023   PLT 330.0 03/16/2023   GLUCOSE 91 03/16/2023   CHOL 202 (H) 03/16/2023   TRIG 75.0 03/16/2023   HDL 44.80 03/16/2023   LDLDIRECT 149.1 05/10/2011   LDLCALC 142 (H) 03/16/2023   ALT 9 03/16/2023   AST 13 03/16/2023   NA 139 03/16/2023   K 4.1 03/16/2023   CL 103 03/16/2023   CREATININE 0.50 03/16/2023   BUN 10 03/16/2023   CO2 28 03/16/2023   TSH 1.84 03/16/2023   HGBA1C 5.6 03/16/2023    Lab Results  Component Value Date   TSH 1.84 03/16/2023   Lab Results  Component Value Date   WBC 8.3 03/16/2023   HGB 13.3 03/16/2023   HCT 40.5 03/16/2023   MCV 90.9 03/16/2023   PLT 330.0 03/16/2023   Lab Results  Component Value Date   NA 139 03/16/2023   K 4.1 03/16/2023   CHLORIDE 106 09/01/2016   CO2 28 03/16/2023   GLUCOSE 91 03/16/2023   BUN 10 03/16/2023   CREATININE 0.50 03/16/2023   BILITOT 0.4 03/16/2023   ALKPHOS 66 03/16/2023   AST 13 03/16/2023   ALT 9 03/16/2023   PROT 6.6 03/16/2023   ALBUMIN 4.0 03/16/2023   CALCIUM 8.7 03/16/2023   ANIONGAP  9 09/01/2016   EGFR >90 09/01/2016   GFR 94.00 03/16/2023   Lab Results  Component Value Date   CHOL 202 (H) 03/16/2023   Lab Results  Component Value Date   HDL 44.80 03/16/2023   Lab Results  Component Value Date   LDLCALC 142 (H) 03/16/2023   Lab Results  Component Value Date   TRIG 75.0 03/16/2023   Lab Results  Component Value Date   CHOLHDL 5 03/16/2023   Lab Results  Component Value Date   HGBA1C 5.6 03/16/2023       Assessment & Plan:  Hyperglycemia Assessment & Plan: hgba1c acceptable, minimize simple carbs. Increase exercise as tolerated.  Orders: -     Comprehensive metabolic panel -     TSH -     Hemoglobin A1c  Dyslipidemia Assessment & Plan: Encourage heart healthy diet such as MIND or DASH diet, increase exercise, avoid trans fats, simple carbohydrates and processed foods, consider a krill or fish or flaxseed oil cap daily.   Orders: -     Lipid panel -  TSH  Vitamin D deficiency Assessment & Plan: Supplement and monitor   Tobacco dependence with current use Assessment & Plan: Encouraged complete cessation   Orders: -     CBC with Differential/Platelet -     VITAMIN D 25 Hydroxy (Vit-D Deficiency, Fractures)  High risk medication use -     Drug Monitoring Panel [phone] , Urine  Estrogen deficiency -     DG Bone Density; Future  Post-menopausal -     DG Bone Density; Future  Osteopenia, unspecified location Assessment & Plan: Encouraged to get adequate exercise, calcium and vitamin d intake repeat Dexa scan ordered today   Anxiety and depression Assessment & Plan: Stable on current meds. Uses Alprazolam very sparingly is allowed a refill today. Continue Lexapro 10 mg daily   Other orders -     ALPRAZolam; Take 1 tablet (0.25 mg total) by mouth 2 (two) times daily as needed for anxiety.  Dispense: 20 tablet; Refill: 1 -     DM TEMPLATE    Danise Edge, MD

## 2023-03-19 NOTE — Assessment & Plan Note (Addendum)
Stable on current meds. Uses Alprazolam very sparingly is allowed a refill today. Continue Lexapro 10 mg daily

## 2023-03-20 ENCOUNTER — Other Ambulatory Visit: Payer: Self-pay | Admitting: *Deleted

## 2023-03-20 MED ORDER — VITAMIN D (ERGOCALCIFEROL) 1.25 MG (50000 UNIT) PO CAPS: 50000.0000 [IU] | ORAL_CAPSULE | ORAL | 0 refills | Status: DC

## 2023-03-23 ENCOUNTER — Ambulatory Visit (HOSPITAL_BASED_OUTPATIENT_CLINIC_OR_DEPARTMENT_OTHER)
Admission: RE | Admit: 2023-03-23 | Discharge: 2023-03-23 | Disposition: A | Discharge status: Home / Self Care | Payer: Medicare Other | Source: Ambulatory Visit | Attending: Family Medicine | Admitting: Family Medicine

## 2023-03-23 DIAGNOSIS — Z780 Asymptomatic menopausal state: Secondary | ICD-10-CM | POA: Insufficient documentation

## 2023-03-23 DIAGNOSIS — M810 Age-related osteoporosis without current pathological fracture: Secondary | ICD-10-CM | POA: Diagnosis not present

## 2023-03-23 DIAGNOSIS — E2839 Other primary ovarian failure: Secondary | ICD-10-CM | POA: Diagnosis not present

## 2023-03-27 ENCOUNTER — Other Ambulatory Visit: Payer: Self-pay

## 2023-03-27 ENCOUNTER — Telehealth: Payer: Self-pay

## 2023-03-27 ENCOUNTER — Other Ambulatory Visit: Payer: Self-pay | Admitting: Family Medicine

## 2023-03-27 MED ORDER — ALENDRONATE SODIUM 70 MG PO TABS: ORAL_TABLET | ORAL | 0 refills | Status: DC

## 2023-03-27 NOTE — Telephone Encounter (Signed)
Pt called back was advised of Fosamax  And was sent in to pharmacy

## 2023-04-21 ENCOUNTER — Other Ambulatory Visit: Payer: Self-pay | Admitting: Family Medicine

## 2023-04-23 ENCOUNTER — Other Ambulatory Visit: Payer: Self-pay | Admitting: Radiology

## 2023-04-23 DIAGNOSIS — B001 Herpesviral vesicular dermatitis: Secondary | ICD-10-CM

## 2023-04-25 NOTE — Telephone Encounter (Signed)
Med refill request: valacyclovir 500mg  Last AEX: 10/24/22  Last MMG (if hormonal med) Recurrent cold sores.  RF sent.

## 2023-05-01 ENCOUNTER — Other Ambulatory Visit: Payer: Self-pay | Admitting: Family Medicine

## 2023-05-06 ENCOUNTER — Emergency Department (HOSPITAL_BASED_OUTPATIENT_CLINIC_OR_DEPARTMENT_OTHER): Payer: Medicare Other

## 2023-05-06 ENCOUNTER — Other Ambulatory Visit: Payer: Self-pay

## 2023-05-06 ENCOUNTER — Encounter (HOSPITAL_BASED_OUTPATIENT_CLINIC_OR_DEPARTMENT_OTHER): Payer: Self-pay

## 2023-05-06 ENCOUNTER — Emergency Department (HOSPITAL_BASED_OUTPATIENT_CLINIC_OR_DEPARTMENT_OTHER)
Admission: EM | Admit: 2023-05-06 | Discharge: 2023-05-06 | Disposition: A | Discharge status: Home / Self Care | Payer: Medicare Other | Attending: Emergency Medicine | Admitting: Emergency Medicine

## 2023-05-06 DIAGNOSIS — S52614A Nondisplaced fracture of right ulna styloid process, initial encounter for closed fracture: Secondary | ICD-10-CM | POA: Diagnosis not present

## 2023-05-06 DIAGNOSIS — S52601A Unspecified fracture of lower end of right ulna, initial encounter for closed fracture: Secondary | ICD-10-CM | POA: Diagnosis not present

## 2023-05-06 DIAGNOSIS — S52591A Other fractures of lower end of right radius, initial encounter for closed fracture: Secondary | ICD-10-CM | POA: Diagnosis not present

## 2023-05-06 DIAGNOSIS — S6991XA Unspecified injury of right wrist, hand and finger(s), initial encounter: Secondary | ICD-10-CM | POA: Diagnosis present

## 2023-05-06 DIAGNOSIS — M25531 Pain in right wrist: Secondary | ICD-10-CM | POA: Insufficient documentation

## 2023-05-06 DIAGNOSIS — W07XXXA Fall from chair, initial encounter: Secondary | ICD-10-CM | POA: Insufficient documentation

## 2023-05-06 DIAGNOSIS — W19XXXA Unspecified fall, initial encounter: Secondary | ICD-10-CM

## 2023-05-06 DIAGNOSIS — S52501A Unspecified fracture of the lower end of right radius, initial encounter for closed fracture: Secondary | ICD-10-CM

## 2023-05-06 MED ORDER — OXYCODONE-ACETAMINOPHEN 5-325 MG PO TABS
1.0000 | ORAL_TABLET | Freq: Once | ORAL | Status: AC
  Administered 2023-05-06: 1 via ORAL
  Filled 2023-05-06: qty 1

## 2023-05-06 MED ORDER — OXYCODONE-ACETAMINOPHEN 5-325 MG PO TABS: 0.5000 | ORAL_TABLET | Freq: Four times a day (QID) | ORAL | 0 refills | Status: DC | PRN

## 2023-05-06 NOTE — Discharge Instructions (Signed)
Please read and follow all provided instructions.  Your diagnoses today include:  1. Closed fracture of distal end of right radius, unspecified fracture morphology, initial encounter   2. Closed nondisplaced fracture of styloid process of right ulna, initial encounter   3. Fall, initial encounter     Tests performed today include: An x-ray of the affected area -x-ray shows a fracture of the distal radius bone and a small fracture in the distal ulna bone  Vital signs. See below for your results today.   Medications prescribed:  Percocet (oxycodone/acetaminophen) - narcotic pain medication  DO NOT drive or perform any activities that require you to be awake and alert because this medicine can make you drowsy. BE VERY CAREFUL not to take multiple medicines containing Tylenol (also called acetaminophen). Doing so can lead to an overdose which can damage your liver and cause liver failure and possibly death.  Take any prescribed medications only as directed.  Home care instructions:  Follow any educational materials contained in this packet Follow R.I.C.E. Protocol: R - rest your injury  I  - use ice on injury without applying directly to skin C - compress injury with bandage or splint E - elevate the injury as much as possible  Follow-up instructions: Please follow-up with your orthopedic physician in 5 days.   Return instructions:  Please return if your fingers are numb or tingling, appear gray or blue, or you have severe pain (also elevate the arm and loosen splint or wrap if you were given one) Please return to the Emergency Department if you experience worsening symptoms.  Please return if you have any other emergent concerns.  Additional Information:  Your vital signs today were: BP (!) 146/87 (BP Location: Left Arm)   Pulse 72   Temp 97.7 F (36.5 C) (Oral)   Resp 18   Ht 5\' 6"  (1.676 m)   Wt 71.7 kg   SpO2 97%   BMI 25.50 kg/m  If your blood pressure (BP) was  elevated above 135/85 this visit, please have this repeated by your doctor within one month. --------------

## 2023-05-06 NOTE — ED Triage Notes (Signed)
Tripped over chair outside and fell onto right arm. C/o right wrist pain. Decreased ROM. Denies hitting head.

## 2023-05-06 NOTE — ED Provider Notes (Cosign Needed Addendum)
[place] [facility] AT [facility] Provider Note   CSN: [id number] Arrival date & time: 05/06/23  1349     History  Chief Complaint  Patient presents with   Arm Injury    [name] is a 72 y.o. female.  Patient presents to the emergency department today for evaluation of right wrist and forearm pain.  Symptoms started approximately 1 hour prior to arrival.  She fell backwards over a chair while outside and tried to stop herself from falling with an outstretched arm.  She complains of pain and swelling.  No treatments prior to arrival.  Pain is worse with movement.  She did not hit her head or lose consciousness.  No subsequent confusion or vomiting.  No anticoagulation.  No elbow or shoulder pain.  Distal sensation intact.  She reports previous left wrist surgery.       Home Medications Prior to Admission medications   Medication Sig Start Date End Date Taking? Authorizing Provider  acetaminophen (TYLENOL) 500 MG tablet Take 500 mg by mouth daily as needed for moderate pain.    [provider]  acyclovir (ZOVIRAX) 400 MG tablet Take 1 tablet (400 mg total) by mouth 3 (three) times daily. X 5 days 09/15/20   Bradd Canary, MD  acyclovir ointment (ZOVIRAX) 5 % Apply 1 Application topically every 3 (three) hours. 11/25/22   Chrzanowski, Jami B, NP  alendronate (FOSAMAX) 70 MG tablet Take 1 tablet (70 mg total) by mouth once a week. Take with a full glass of water on an empty stomach. 03/27/23   Bradd Canary, MD  ALPRAZolam Prudy Feeler) 0.25 MG tablet Take 1 tablet (0.25 mg total) by mouth 2 (two) times daily as needed for anxiety. 03/16/23   Bradd Canary, MD  Ascorbic Acid (VITAMIN C PO) Take by mouth.    [provider]  escitalopram (LEXAPRO) 10 MG tablet TAKE 1 TABLET BY MOUTH DAILY 05/02/23   Bradd Canary, MD  gabapentin (NEURONTIN) 300 MG capsule TAKE 1 CAPSULE BY MOUTH AT  BEDTIME 02/28/23   Bradd Canary, MD  ibuprofen  (ADVIL,MOTRIN) 400 MG tablet Take 400 mg by mouth every 6 (six) hours as needed for pain.    [provider]  Multiple Vitamins-Minerals (CENTRUM SILVER PO) Take by mouth.    [provider]  nicotine (NICODERM CQ - DOSED IN MG/24 HOURS) 14 mg/24hr patch Place 1 patch (14 mg total) onto the skin daily. 03/11/22   Bradd Canary, MD  nicotine (NICODERM CQ - DOSED IN MG/24 HR) 7 mg/24hr patch Place 1 patch (7 mg total) onto the skin daily. Start after completing 14 mg patches 03/11/22   Bradd Canary, MD  traMADol (ULTRAM) 50 MG tablet TAKE 1 TABLET BY MOUTH TWICE A DAY AS NEEDED FOR MODERATE PAIN 04/21/23   Bradd Canary, MD  valACYclovir (VALTREX) 500 MG tablet TAKE 1 TABLET (500 MG TOTAL) BY MOUTH 2 (TWO) TIMES DAILY. FOR 3 DAYS 04/25/23   Chrzanowski, Clearnce Hasten B, NP  Vitamin D, Ergocalciferol, (DRISDOL) 1.25 MG (50000 UNIT) CAPS capsule TAKE 1 CAPSULE (50,000 UNITS TOTAL) BY MOUTH EVERY 7 (SEVEN) DAYS 04/21/23   Bradd Canary, MD      Allergies    Patient has no known allergies.    Review of Systems   Review of Systems  Physical Exam Updated Vital Signs BP (!) 146/87 (BP Location: Left Arm)   Pulse 72   Temp 97.7 F (36.5 C) (  Oral)   Resp 18   Ht 5\' 6"  (1.676 m)   Wt 71.7 kg   SpO2 97%   BMI 25.50 kg/m   Physical Exam Vitals and nursing note reviewed.  Constitutional:      Appearance: She is well-developed.  HENT:     Head: Normocephalic and atraumatic.  Eyes:     Pupils: Pupils are equal, round, and reactive to light.  Cardiovascular:     Pulses: Normal pulses. No decreased pulses.  Musculoskeletal:        General: Tenderness present.     Right shoulder: No tenderness. Normal range of motion.     Right elbow: Normal range of motion. No tenderness.     Right forearm: Tenderness present. No swelling or edema.     Right wrist: Swelling and tenderness present. No deformity or effusion. Decreased range of motion. Normal pulse.     Right hand: No tenderness.  Normal range of motion.     Cervical back: Normal range of motion and neck supple.     Comments: Compartments of the right forearm are soft.  Patient is tender over both the ulnar and radial side of the right wrist.  Minor swelling at this point.  On the volar aspect of the wrist, patient has poison ivy rash.  There are no wounds and fracture appears closed.  Skin:    General: Skin is warm and dry.  Neurological:     Mental Status: She is alert.     Sensory: No sensory deficit.     Comments: Motor, sensation, and vascular distal to the injury is fully intact.   Psychiatric:        Mood and Affect: Mood normal.     ED Results / Procedures / Treatments   Labs (all labs ordered are listed, but only abnormal results are displayed) Labs Reviewed - No data to display  EKG None  Radiology DG Wrist Complete Right  Result Date: 05/06/2023 CLINICAL DATA:  Pain after injury EXAM: RIGHT HAND - COMPLETE 3 VIEW; RIGHT WRIST - COMPLETE 3 VIEW COMPARISON:  None Available. FINDINGS: There is a comminuted fracture of the distal radial metaphysis. Fracture lines extend towards the distal radioulnar joint and radiocarpal joint. Separate nondisplaced ulnar styloid fracture. No additional fracture or dislocation. Osteopenia. Mild degenerative changes identified including along several interphalangeal joints has a distal interphalangeal joints of the third and fourth digit, the interphalangeal joint of the thumb. Subchondral cyst formation or geode formation of the lunate. IMPRESSION: Fractures of the distal radius extending to the radiocarpal joint. Separate ulnar styloid fracture. Degenerative changes. Electronically Signed   By: Karen Kays M.D.   On: 05/06/2023 14:26   DG Hand Complete Right  Result Date: 05/06/2023 CLINICAL DATA:  Pain after injury EXAM: RIGHT HAND - COMPLETE 3 VIEW; RIGHT WRIST - COMPLETE 3 VIEW COMPARISON:  None Available. FINDINGS: There is a comminuted fracture of the distal radial  metaphysis. Fracture lines extend towards the distal radioulnar joint and radiocarpal joint. Separate nondisplaced ulnar styloid fracture. No additional fracture or dislocation. Osteopenia. Mild degenerative changes identified including along several interphalangeal joints has a distal interphalangeal joints of the third and fourth digit, the interphalangeal joint of the thumb. Subchondral cyst formation or geode formation of the lunate. IMPRESSION: Fractures of the distal radius extending to the radiocarpal joint. Separate ulnar styloid fracture. Degenerative changes. Electronically Signed   By: Karen Kays M.D.   On: 05/06/2023 14:26    Procedures Procedures  Medications Ordered in ED Medications  oxyCODONE-acetaminophen (PERCOCET/ROXICET) 5-325 MG per tablet 1 tablet (1 tablet Oral Given 05/06/23 1453)    ED Course/ Medical Decision Making/ A&P    Patient seen and examined. History obtained directly from patient. Work-up including labs, imaging, EKG ordered in triage, if performed, were reviewed.    Labs/EKG: None ordered  Imaging: X-ray of the wrist, agree distal radius fracture, ulnar styloid fracture.  Medications/Fluids: Ordered: Oral Percocet for pain  Most recent vital signs reviewed and are as follows: BP (!) 146/87 (BP Location: Left Arm)   Pulse 72   Temp 97.7 F (36.5 C) (Oral)   Resp 18   Ht 5\' 6"  (1.676 m)   Wt 71.7 kg   SpO2 97%   BMI 25.50 kg/m   Initial impression: Distal right radius fracture and ulnar styloid fracture.  Minimal displacement.  Home treatment plan: RICE protocol with focus on elevation, pain control, splint and sling  Patient counseled on use of narcotic pain medications. Counseled not to combine these medications with others containing tylenol. Urged not to drink alcohol, drive, or perform any other activities that requires focus while taking these medications. The patient verbalizes understanding and agrees with the plan.  Return  instructions discussed with patient: New or worsening symptoms  Follow-up instructions discussed with patient: Follow-up with her orthopedist, will give on-call referral as well as backup plan.                            Medical Decision Making Amount and/or Complexity of Data Reviewed Radiology: ordered.  Risk Prescription drug management.   Patient with mechanical fall, distal radius and ulnar styloid fractures.  Do not feel that given relatively good alignment, that this requires reduction at this time.  Fractures are closed.  Patient is neurovascularly intact with soft compartments in the forearm.  Follow-up plan as above.    Final Clinical Impression(s) / ED Diagnoses Final diagnoses:  Closed fracture of distal end of right radius, unspecified fracture morphology, initial encounter  Closed nondisplaced fracture of styloid process of right ulna, initial encounter  Fall, initial encounter    Rx / DC Orders ED Discharge Orders          Ordered    oxyCODONE-acetaminophen (PERCOCET/ROXICET) 5-325 MG tablet  Every 6 hours PRN        05/06/23 1515               Renne Crigler, PA-C 05/06/23 1521    Cathren Laine, MD 05/07/23 2140

## 2023-05-10 DIAGNOSIS — L255 Unspecified contact dermatitis due to plants, except food: Secondary | ICD-10-CM | POA: Diagnosis not present

## 2023-05-10 DIAGNOSIS — S52501D Unspecified fracture of the lower end of right radius, subsequent encounter for closed fracture with routine healing: Secondary | ICD-10-CM | POA: Diagnosis not present

## 2023-05-18 DIAGNOSIS — S52501D Unspecified fracture of the lower end of right radius, subsequent encounter for closed fracture with routine healing: Secondary | ICD-10-CM | POA: Diagnosis not present

## 2023-06-05 DIAGNOSIS — S52501D Unspecified fracture of the lower end of right radius, subsequent encounter for closed fracture with routine healing: Secondary | ICD-10-CM | POA: Diagnosis not present

## 2023-06-20 DIAGNOSIS — H25043 Posterior subcapsular polar age-related cataract, bilateral: Secondary | ICD-10-CM | POA: Diagnosis not present

## 2023-06-20 DIAGNOSIS — H2513 Age-related nuclear cataract, bilateral: Secondary | ICD-10-CM | POA: Diagnosis not present

## 2023-06-20 DIAGNOSIS — H18413 Arcus senilis, bilateral: Secondary | ICD-10-CM | POA: Diagnosis not present

## 2023-06-20 DIAGNOSIS — H2511 Age-related nuclear cataract, right eye: Secondary | ICD-10-CM | POA: Diagnosis not present

## 2023-06-20 DIAGNOSIS — H25013 Cortical age-related cataract, bilateral: Secondary | ICD-10-CM | POA: Diagnosis not present

## 2023-06-22 ENCOUNTER — Other Ambulatory Visit: Payer: Self-pay | Admitting: Family Medicine

## 2023-06-23 NOTE — Telephone Encounter (Signed)
Requesting: tramadol 50mg   Contract:  04/14/23 UDS: 03/16/23 Last Visit: 03/16/23 Next Visit: 09/19/23 Last Refill: 04/21/23 #60 and 0RF    Please Advise

## 2023-06-28 DIAGNOSIS — S52501D Unspecified fracture of the lower end of right radius, subsequent encounter for closed fracture with routine healing: Secondary | ICD-10-CM | POA: Diagnosis not present

## 2023-07-05 ENCOUNTER — Telehealth: Payer: Self-pay | Admitting: Family Medicine

## 2023-07-05 ENCOUNTER — Other Ambulatory Visit: Payer: Self-pay | Admitting: Family Medicine

## 2023-07-05 MED ORDER — TRIAMCINOLONE ACETONIDE 0.1 % EX CREA: 1.0000 | TOPICAL_CREAM | Freq: Two times a day (BID) | CUTANEOUS | 0 refills | Status: DC

## 2023-07-05 NOTE — Telephone Encounter (Signed)
Pt notified , told her to give [place] a call back if the steroid cream doesn't or witch hazel doesn't work

## 2023-07-05 NOTE — Telephone Encounter (Signed)
Pt called stating that she had gotten poison oak on the inside portion of her arm and was wondering if a topical solution could be called in to help treat it as cortisone is not helping.

## 2023-07-19 DIAGNOSIS — S52501D Unspecified fracture of the lower end of right radius, subsequent encounter for closed fracture with routine healing: Secondary | ICD-10-CM | POA: Diagnosis not present

## 2023-08-23 DIAGNOSIS — H2513 Age-related nuclear cataract, bilateral: Secondary | ICD-10-CM | POA: Diagnosis not present

## 2023-08-23 DIAGNOSIS — H2511 Age-related nuclear cataract, right eye: Secondary | ICD-10-CM | POA: Diagnosis not present

## 2023-08-24 DIAGNOSIS — H2512 Age-related nuclear cataract, left eye: Secondary | ICD-10-CM | POA: Diagnosis not present

## 2023-08-24 DIAGNOSIS — H25042 Posterior subcapsular polar age-related cataract, left eye: Secondary | ICD-10-CM | POA: Diagnosis not present

## 2023-08-24 DIAGNOSIS — H25012 Cortical age-related cataract, left eye: Secondary | ICD-10-CM | POA: Diagnosis not present

## 2023-08-28 ENCOUNTER — Other Ambulatory Visit: Payer: Self-pay | Admitting: Family Medicine

## 2023-08-28 NOTE — Telephone Encounter (Signed)
Requesting: tramadol 50mg   Contract: 03/16/23 UDS: 03/16/23 Last Visit: 03/16/23 Next Visit:  09/19/23 Last Refill: 06/23/23 #60 and 0RF  Please Advise

## 2023-09-06 DIAGNOSIS — H2512 Age-related nuclear cataract, left eye: Secondary | ICD-10-CM | POA: Diagnosis not present

## 2023-09-07 DIAGNOSIS — H2512 Age-related nuclear cataract, left eye: Secondary | ICD-10-CM | POA: Diagnosis not present

## 2023-09-14 DIAGNOSIS — H2512 Age-related nuclear cataract, left eye: Secondary | ICD-10-CM | POA: Diagnosis not present

## 2023-09-17 NOTE — Assessment & Plan Note (Addendum)
Patient encouraged to maintain heart healthy diet, regular exercise, adequate sleep. Consider daily probiotics. Take medications as prescribed. Given and reviewed copy of ACP documents from [facility] Secretary of [place] and encouraged to complete and return Colonoscopy 2020 repeat in 2027 Dexa 02/2023 repeat every 2-5 years Mgm 11/2022 repeat every 1-2 years Pap 09/2022 repeat in 2026 to 2028

## 2023-09-17 NOTE — Assessment & Plan Note (Signed)
Encouraged to get adequate exercise, calcium and vitamin d intake 

## 2023-09-17 NOTE — Assessment & Plan Note (Signed)
Supplement and monitor 

## 2023-09-17 NOTE — Assessment & Plan Note (Signed)
Encourage heart healthy diet such as MIND or DASH diet, increase exercise, avoid trans fats, simple carbohydrates and processed foods, consider a krill or fish or flaxseed oil cap daily.  °

## 2023-09-17 NOTE — Assessment & Plan Note (Signed)
hgba1c acceptable, minimize simple carbs. Increase exercise as tolerated.  

## 2023-09-17 NOTE — Assessment & Plan Note (Signed)
Stable on current meds. Uses Alprazolam very sparingly is allowed a refill today. Continue Lexapro 10 mg daily

## 2023-09-19 ENCOUNTER — Ambulatory Visit (INDEPENDENT_AMBULATORY_CARE_PROVIDER_SITE_OTHER): Payer: Medicare Other | Admitting: Family Medicine

## 2023-09-19 ENCOUNTER — Encounter: Payer: Self-pay | Admitting: Family Medicine

## 2023-09-19 ENCOUNTER — Telehealth: Payer: Self-pay

## 2023-09-19 VITALS — BP 130/78 | HR 78 | Temp 97.7°F | Resp 18 | Ht 65.0 in | Wt 158.8 lb

## 2023-09-19 DIAGNOSIS — Z79899 Other long term (current) drug therapy: Secondary | ICD-10-CM

## 2023-09-19 DIAGNOSIS — M6281 Muscle weakness (generalized): Secondary | ICD-10-CM

## 2023-09-19 DIAGNOSIS — F17200 Nicotine dependence, unspecified, uncomplicated: Secondary | ICD-10-CM

## 2023-09-19 DIAGNOSIS — M810 Age-related osteoporosis without current pathological fracture: Secondary | ICD-10-CM | POA: Diagnosis not present

## 2023-09-19 DIAGNOSIS — F419 Anxiety disorder, unspecified: Secondary | ICD-10-CM | POA: Diagnosis not present

## 2023-09-19 DIAGNOSIS — Z23 Encounter for immunization: Secondary | ICD-10-CM

## 2023-09-19 DIAGNOSIS — Z0000 Encounter for general adult medical examination without abnormal findings: Secondary | ICD-10-CM | POA: Diagnosis not present

## 2023-09-19 DIAGNOSIS — E785 Hyperlipidemia, unspecified: Secondary | ICD-10-CM | POA: Diagnosis not present

## 2023-09-19 DIAGNOSIS — M8580 Other specified disorders of bone density and structure, unspecified site: Secondary | ICD-10-CM

## 2023-09-19 DIAGNOSIS — R739 Hyperglycemia, unspecified: Secondary | ICD-10-CM

## 2023-09-19 DIAGNOSIS — E559 Vitamin D deficiency, unspecified: Secondary | ICD-10-CM | POA: Diagnosis not present

## 2023-09-19 DIAGNOSIS — F32A Depression, unspecified: Secondary | ICD-10-CM | POA: Diagnosis not present

## 2023-09-19 LAB — COMPREHENSIVE METABOLIC PANEL
ALT: 9 U/L (ref 0–35)
AST: 13 U/L (ref 0–37)
Albumin: 4 g/dL (ref 3.5–5.2)
Alkaline Phosphatase: 59 U/L (ref 39–117)
BUN: 12 mg/dL (ref 6–23)
CO2: 28 meq/L (ref 19–32)
Calcium: 8.7 mg/dL (ref 8.4–10.5)
Chloride: 104 meq/L (ref 96–112)
Creatinine, Ser: 0.54 mg/dL (ref 0.40–1.20)
GFR: 91.94 mL/min (ref >60.00)
Glucose, Bld: 89 mg/dL (ref 70–99)
Potassium: 4.2 meq/L (ref 3.5–5.1)
Sodium: 139 meq/L (ref 135–145)
Total Bilirubin: 0.3 mg/dL (ref 0.2–1.2)
Total Protein: 6.7 g/dL (ref 6.0–8.3)

## 2023-09-19 LAB — LIPID PANEL
Cholesterol: 212 mg/dL — ABNORMAL HIGH (ref 0–200)
HDL: 40.8 mg/dL (ref >39.00)
LDL Cholesterol: 145 mg/dL — ABNORMAL HIGH (ref 0–99)
NonHDL: 170.89
Total CHOL/HDL Ratio: 5
Triglycerides: 129 mg/dL (ref 0.0–149.0)
VLDL: 25.8 mg/dL (ref 0.0–40.0)

## 2023-09-19 LAB — HEMOGLOBIN A1C: Hgb A1c MFr Bld: 5.6 % (ref 4.6–6.5)

## 2023-09-19 LAB — VITAMIN D 25 HYDROXY (VIT D DEFICIENCY, FRACTURES): VITD: 23.77 ng/mL — ABNORMAL LOW (ref 30.00–100.00)

## 2023-09-19 LAB — CBC WITH DIFFERENTIAL/PLATELET
Basophils Absolute: 0.1 10*3/uL (ref 0.0–0.1)
Basophils Relative: 0.9 % (ref 0.0–3.0)
Eosinophils Absolute: 0.1 10*3/uL (ref 0.0–0.7)
Eosinophils Relative: 1.5 % (ref 0.0–5.0)
HCT: 42 % (ref 36.0–46.0)
Hemoglobin: 13.5 g/dL (ref 12.0–15.0)
Lymphocytes Relative: 25.3 % (ref 12.0–46.0)
Lymphs Abs: 2.2 10*3/uL (ref 0.7–4.0)
MCHC: 32.2 g/dL (ref 30.0–36.0)
MCV: 90.9 fL (ref 78.0–100.0)
Monocytes Absolute: 0.6 10*3/uL (ref 0.1–1.0)
Monocytes Relative: 6.8 % (ref 3.0–12.0)
Neutro Abs: 5.6 10*3/uL (ref 1.4–7.7)
Neutrophils Relative %: 65.5 % (ref 43.0–77.0)
Platelets: 333 10*3/uL (ref 150.0–400.0)
RBC: 4.62 Mil/uL (ref 3.87–5.11)
RDW: 14.1 % (ref 11.5–15.5)
WBC: 8.5 10*3/uL (ref 4.0–10.5)

## 2023-09-19 LAB — TSH: TSH: 2.39 u[IU]/mL (ref 0.35–5.50)

## 2023-09-19 NOTE — Telephone Encounter (Signed)
Please check benefits

## 2023-09-19 NOTE — Assessment & Plan Note (Signed)
Is cutting down on consumption as her brother now has heart disease and is going to move in with her so she is trying to cut down for him. Down to several a day

## 2023-09-19 NOTE — Assessment & Plan Note (Signed)
Thighs are weaker and getting off the ground is harder is going to go to the [facility] to work on this

## 2023-09-19 NOTE — Patient Instructions (Addendum)
Netflix Live to 100 the secrets of the [employer]  Shingrix is the new shingles shot, 2 shots over 2-6 months, confirm coverage with insurance and document, then can return here for shots with nurse appt or at pharmacy   Annual Covid booster  RSV, Respiratory Syncitial Virus vaccine, Arexvy booster    Preventive Care 65 Years and Older, Female Preventive care refers to lifestyle choices and visits with your health care provider that can promote health and wellness. Preventive care visits are also called wellness exams. What can I expect for my preventive care visit? Counseling Your health care provider may ask you questions about your: Medical history, including: Past medical problems. Family medical history. Pregnancy and menstrual history. History of falls. Current health, including: Memory and ability to understand (cognition). Emotional well-being. Home life and relationship well-being. Sexual activity and sexual health. Lifestyle, including: Alcohol, nicotine or tobacco, and drug use. Access to firearms. Diet, exercise, and sleep habits. Work and work Astronomer. Sunscreen use. Safety issues such as seatbelt and bike helmet use. Physical exam Your health care provider will check your: Height and weight. These may be used to calculate your BMI (body mass index). BMI is a measurement that tells if you are at a healthy weight. Waist circumference. This measures the distance around your waistline. This measurement also tells if you are at a healthy weight and may help predict your risk of certain diseases, such as type 2 diabetes and high blood pressure. Heart rate and blood pressure. Body temperature. Skin for abnormal spots. What immunizations do I need?  Vaccines are usually given at various ages, according to a schedule. Your health care provider will recommend vaccines for you based on your age, medical history, and lifestyle or other factors, such as travel or where  you work. What tests do I need? Screening Your health care provider may recommend screening tests for certain conditions. This may include: Lipid and cholesterol levels. Hepatitis C test. Hepatitis B test. HIV (human immunodeficiency virus) test. STI (sexually transmitted infection) testing, if you are at risk. Lung cancer screening. Colorectal cancer screening. Diabetes screening. This is done by checking your blood sugar (glucose) after you have not eaten for a while (fasting). Mammogram. Talk with your health care provider about how often you should have regular mammograms. BRCA-related cancer screening. This may be done if you have a family history of breast, ovarian, tubal, or peritoneal cancers. Bone density scan. This is done to screen for osteoporosis. Talk with your health care provider about your test results, treatment options, and if necessary, the need for more tests. Follow these instructions at home: Eating and drinking  Eat a diet that includes fresh fruits and vegetables, whole grains, lean protein, and low-fat dairy products. Limit your intake of foods with high amounts of sugar, saturated fats, and salt. Take vitamin and mineral supplements as recommended by your health care provider. Do not drink alcohol if your health care provider tells you not to drink. If you drink alcohol: Limit how much you have to 0-1 drink a day. Know how much alcohol is in your drink. In the [place] one drink equals one 12 oz bottle of beer (355 mL), one 5 oz glass of wine (148 mL), or one 1 oz glass of hard liquor (44 mL). Lifestyle Brush your teeth every morning and night with fluoride toothpaste. Floss one time each day. Exercise for at least 30 minutes 5 or more days each week. Do not use any products that contain  nicotine or tobacco. These products include cigarettes, chewing tobacco, and vaping devices, such as e-cigarettes. If you need help quitting, ask your health care provider. Do  not use drugs. If you are sexually active, practice safe sex. Use a condom or other form of protection in order to prevent STIs. Take aspirin only as told by your health care provider. Make sure that you understand how much to take and what form to take. Work with your health care provider to find out whether it is safe and beneficial for you to take aspirin daily. Ask your health care provider if you need to take a cholesterol-lowering medicine (statin). Find healthy ways to manage stress, such as: Meditation, yoga, or listening to music. Journaling. Talking to a trusted person. Spending time with friends and family. Minimize exposure to UV radiation to reduce your risk of skin cancer. Safety Always wear your seat belt while driving or riding in a vehicle. Do not drive: If you have been drinking alcohol. Do not ride with someone who has been drinking. When you are tired or distracted. While texting. If you have been using any mind-altering substances or drugs. Wear a helmet and other protective equipment during sports activities. If you have firearms in your house, make sure you follow all gun safety procedures. What's next? Visit your health care provider once a year for an annual wellness visit. Ask your health care provider how often you should have your eyes and teeth checked. Stay up to date on all vaccines. This information is not intended to replace advice given to you by your health care provider. Make sure you discuss any questions you have with your health care provider. Document Revised: 05/12/2021 Document Reviewed: 05/12/2021 Elsevier Patient Education  2024 [employer].

## 2023-09-20 ENCOUNTER — Other Ambulatory Visit: Payer: Self-pay | Admitting: Emergency Medicine

## 2023-09-20 ENCOUNTER — Telehealth: Payer: Self-pay

## 2023-09-20 ENCOUNTER — Encounter: Payer: Self-pay | Admitting: Family Medicine

## 2023-09-20 ENCOUNTER — Other Ambulatory Visit (HOSPITAL_COMMUNITY): Payer: Self-pay

## 2023-09-20 MED ORDER — VITAMIN D (ERGOCALCIFEROL) 1.25 MG (50000 UNIT) PO CAPS: 50000.0000 [IU] | ORAL_CAPSULE | ORAL | 4 refills | Status: DC

## 2023-09-20 NOTE — Telephone Encounter (Signed)
Created new encounter for Prolia BIV. Will route encounter back once benefit verification is complete.  

## 2023-09-20 NOTE — Progress Notes (Signed)
Subjective:    Patient ID: [name], female    DOB: 09/24/1951, 72 y.o.   MRN: [id number]  Chief Complaint  Patient presents with  . Annual Exam    HPI Discussed the use of AI scribe software for clinical note transcription with the patient, who gave verbal consent to proceed.  History of Present Illness   The patient, with a history of osteoporosis, presents for a routine check-up. She reports a recent right wrist fracture sustained from a fall while watering flowers on her deck. The fracture was managed conservatively and has been healing well with regular follow-ups. The patient also mentions a history of smoking, currently down to about eight cigarettes a day from a previous higher amount. She expresses a desire to quit completely. The patient also reports experiencing tinnitus, which she finds annoying but manageable. She has noticed an increase in skin tags, but these do not cause any discomfort or irritation. The patient's sister has been diagnosed with polymyalgia rheumatica, but the patient does not report any similar symptoms.        Past Medical History:  Diagnosis Date  . Arthritis   . Cancer [facility]) [id number]   Breast  . Chicken pox   . Colon polyps   . Depression with anxiety 09/07/2012  . Dyslipidemia   . Elevated BP 09/09/2012  . H/O tobacco use, presenting hazards to health 05/05/2011  . Left hip pain 05/04/2011  . Osteoporosis 10/2019   T score -2.2.  Stable from prior DEXA 2018. DEXA 2014 T score -2.5  . Preventative health care 09/09/2012  . Tobacco abuse 05/05/2011    Past Surgical History:  Procedure Laterality Date  . APPENDECTOMY  2010  . BREAST LUMPECTOMY  1998   right  . BREAST SURGERY  07/08/11   right mastectomy+axlnd, reconstruction,T1cN1a,ERPR+,HER2-  . BREAST SURGERY  march,2013   breast implant deflated  . COLONOSCOPY    . POLYPECTOMY    . WRIST FRACTURE SURGERY      Family History  Problem Relation Age of Onset  . Diabetes Mother         type 2  . Obesity Mother   . Cancer Father        ?  Marland Kitchen Heart block Sister   . Heart disease Sister        s/p CABG  . Polymyalgia rheumatica Sister   . Heart disease Brother        s/p CABG  . Colon cancer Neg Hx   . Esophageal cancer Neg Hx   . Rectal cancer Neg Hx   . Stomach cancer Neg Hx   . Colon polyps Neg Hx     Social History   Socioeconomic History  . Marital status: Widowed    Spouse name: Not on file  . Number of children: Not on file  . Years of education: Not on file  . Highest education level: Not on file  Occupational History  . Not on file  Tobacco Use  . Smoking status: Some Days  . Smokeless tobacco: Never  Vaping Use  . Vaping status: Never Used  Substance and Sexual Activity  . Alcohol use: Yes    Alcohol/week: 0.0 standard drinks of alcohol    Comment: occasionally  . Drug use: No  . Sexual activity: Not Currently    Comment: 1st intercourse 33 yo-5 partners  Other Topics Concern  . Not on file  Social History Narrative  . Not on file   Social  Determinants of Health   Financial Resource Strain: Not on file  Food Insecurity: Not on file  Transportation Needs: Not on file  Physical Activity: Not on file  Stress: Not on file  Social Connections: Not on file  Intimate Partner Violence: Not on file    Outpatient Medications Prior to Visit  Medication Sig Dispense Refill  . acetaminophen (TYLENOL) 500 MG tablet Take 500 mg by mouth daily as needed for moderate pain.    Marland Kitchen acyclovir (ZOVIRAX) 400 MG tablet Take 1 tablet (400 mg total) by mouth 3 (three) times daily. X 5 days 15 tablet 1  . acyclovir ointment (ZOVIRAX) 5 % Apply 1 Application topically every 3 (three) hours. 15 g 0  . alendronate (FOSAMAX) 70 MG tablet Take 1 tablet (70 mg total) by mouth once a week. Take with a full glass of water on an empty stomach. 12 tablet 3  . ALPRAZolam (XANAX) 0.25 MG tablet Take 1 tablet (0.25 mg total) by mouth 2 (two) times daily as needed  for anxiety. 20 tablet 1  . Ascorbic Acid (VITAMIN C PO) Take by mouth.    . escitalopram (LEXAPRO) 10 MG tablet TAKE 1 TABLET BY MOUTH DAILY 90 tablet 1  . gabapentin (NEURONTIN) 300 MG capsule TAKE 1 CAPSULE BY MOUTH AT  BEDTIME 100 capsule 2  . ibuprofen (ADVIL,MOTRIN) 400 MG tablet Take 400 mg by mouth every 6 (six) hours as needed for pain.    . Multiple Vitamins-Minerals (CENTRUM SILVER PO) Take by mouth.    . nicotine (NICODERM CQ - DOSED IN MG/24 HOURS) 14 mg/24hr patch Place 1 patch (14 mg total) onto the skin daily. 28 patch 1  . nicotine (NICODERM CQ - DOSED IN MG/24 HR) 7 mg/24hr patch Place 1 patch (7 mg total) onto the skin daily. Start after completing 14 mg patches 28 patch 1  . traMADol (ULTRAM) 50 MG tablet TAKE 1 TABLET BY MOUTH TWICE A DAY AS NEEDED FOR MODERATE PAIN 60 tablet 0  . triamcinolone cream (KENALOG) 0.1 % APPLY TO AFFECTED AREA TWICE A DAY 60 g 1  . valACYclovir (VALTREX) 500 MG tablet TAKE 1 TABLET (500 MG TOTAL) BY MOUTH 2 (TWO) TIMES DAILY. FOR 3 DAYS 6 tablet 0  . oxyCODONE-acetaminophen (PERCOCET/ROXICET) 5-325 MG tablet Take 0.5-1 tablets by mouth every 6 (six) hours as needed for severe pain. 6 tablet 0  . Vitamin D, Ergocalciferol, (DRISDOL) 1.25 MG (50000 UNIT) CAPS capsule TAKE 1 CAPSULE (50,000 UNITS TOTAL) BY MOUTH EVERY 7 (SEVEN) DAYS 12 capsule 0   No facility-administered medications prior to visit.    No Known Allergies  Review of Systems  Constitutional:  Negative for chills, fever and malaise/fatigue.  HENT:  Negative for congestion and hearing loss.   Eyes:  Negative for discharge.  Respiratory:  Negative for cough, sputum production and shortness of breath.   Cardiovascular:  Negative for chest pain, palpitations and leg swelling.  Gastrointestinal:  Negative for abdominal pain, blood in stool, constipation, diarrhea, heartburn, nausea and vomiting.  Genitourinary:  Negative for dysuria, frequency, hematuria and urgency.  Musculoskeletal:   Positive for falls and joint pain. Negative for back pain and myalgias.  Skin:  Negative for rash.  Neurological:  Negative for dizziness, sensory change, loss of consciousness, weakness and headaches.  Endo/Heme/Allergies:  Negative for environmental allergies. Does not bruise/bleed easily.  Psychiatric/Behavioral:  Negative for depression and suicidal ideas. The patient is not nervous/anxious and does not have insomnia.  Objective:    Physical Exam Constitutional:      General: She is not in acute distress.    Appearance: Normal appearance. She is not diaphoretic.  HENT:     Head: Normocephalic and atraumatic.     Right Ear: Tympanic membrane, ear canal and external ear normal.     Left Ear: Tympanic membrane, ear canal and external ear normal.     Nose: Nose normal.     Mouth/Throat:     Mouth: Mucous membranes are moist.     Pharynx: Oropharynx is clear. No oropharyngeal exudate.  Eyes:     General: No scleral icterus.       Right eye: No discharge.        Left eye: No discharge.     Conjunctiva/sclera: Conjunctivae normal.     Pupils: Pupils are equal, round, and reactive to light.  Neck:     Thyroid: No thyromegaly.  Cardiovascular:     Rate and Rhythm: Normal rate and regular rhythm.     Heart sounds: Normal heart sounds. No murmur heard. Pulmonary:     Effort: Pulmonary effort is normal. No respiratory distress.     Breath sounds: Normal breath sounds. No wheezing or rales.  Abdominal:     General: Bowel sounds are normal. There is no distension.     Palpations: Abdomen is soft. There is no mass.     Tenderness: There is no abdominal tenderness.  Musculoskeletal:        General: No tenderness. Normal range of motion.     Cervical back: Normal range of motion and neck supple.  Lymphadenopathy:     Cervical: No cervical adenopathy.  Skin:    General: Skin is warm and dry.     Findings: No rash.  Neurological:     General: No focal deficit present.      Mental Status: She is alert and oriented to person, place, and time.     Cranial Nerves: No cranial nerve deficit.     Coordination: Coordination normal.     Deep Tendon Reflexes: Reflexes are normal and symmetric. Reflexes normal.  Psychiatric:        Mood and Affect: Mood normal.        Behavior: Behavior normal.        Thought Content: Thought content normal.        Judgment: Judgment normal.    BP 130/78 (BP Location: Right Arm, Patient Position: Sitting, Cuff Size: Normal)   Pulse 78   Temp 97.7 F (36.5 C) (Oral)   Resp 18   Ht 5\' 5"  (1.651 m)   Wt 158 lb 12.8 oz (72 kg)   SpO2 96%   BMI 26.43 kg/m  Wt Readings from Last 3 Encounters:  09/19/23 158 lb 12.8 oz (72 kg)  05/06/23 158 lb (71.7 kg)  03/16/23 158 lb 6.4 oz (71.8 kg)    Diabetic Foot Exam - Simple   No data filed    Lab Results  Component Value Date   WBC 8.5 09/19/2023   HGB 13.5 09/19/2023   HCT 42.0 09/19/2023   PLT 333.0 09/19/2023   GLUCOSE 89 09/19/2023   CHOL 212 (H) 09/19/2023   TRIG 129.0 09/19/2023   HDL 40.80 09/19/2023   LDLDIRECT 149.1 05/10/2011   LDLCALC 145 (H) 09/19/2023   ALT 9 09/19/2023   AST 13 09/19/2023   NA 139 09/19/2023   K 4.2 Hemolysis seen.. 09/19/2023   CL 104 09/19/2023   CREATININE 0.54  09/19/2023   BUN 12 09/19/2023   CO2 28 09/19/2023   TSH 2.39 09/19/2023   HGBA1C 5.6 09/19/2023    Lab Results  Component Value Date   TSH 2.39 09/19/2023   Lab Results  Component Value Date   WBC 8.5 09/19/2023   HGB 13.5 09/19/2023   HCT 42.0 09/19/2023   MCV 90.9 09/19/2023   PLT 333.0 09/19/2023   Lab Results  Component Value Date   NA 139 09/19/2023   K 4.2 Hemolysis seen.. 09/19/2023   CHLORIDE 106 09/01/2016   CO2 28 09/19/2023   GLUCOSE 89 09/19/2023   BUN 12 09/19/2023   CREATININE 0.54 09/19/2023   BILITOT 0.3 09/19/2023   ALKPHOS 59 09/19/2023   AST 13 09/19/2023   ALT 9 09/19/2023   PROT 6.7 09/19/2023   ALBUMIN 4.0 09/19/2023   CALCIUM 8.7  09/19/2023   ANIONGAP 9 09/01/2016   EGFR >90 09/01/2016   GFR 91.94 09/19/2023   Lab Results  Component Value Date   CHOL 212 (H) 09/19/2023   Lab Results  Component Value Date   HDL 40.80 09/19/2023   Lab Results  Component Value Date   LDLCALC 145 (H) 09/19/2023   Lab Results  Component Value Date   TRIG 129.0 09/19/2023   Lab Results  Component Value Date   CHOLHDL 5 09/19/2023   Lab Results  Component Value Date   HGBA1C 5.6 09/19/2023       Assessment & Plan:  Anxiety and depression Assessment & Plan: Stable on current meds. Uses Alprazolam very sparingly is allowed a refill today. Continue Lexapro 10 mg daily   Vitamin D deficiency Assessment & Plan: Supplement and monitor  Orders: -     VITAMIN D 25 Hydroxy (Vit-D Deficiency, Fractures)  Hyperglycemia Assessment & Plan: hgba1c acceptable, minimize simple carbs. Increase exercise as tolerated.  Orders: -     Comprehensive metabolic panel -     CBC with Differential/Platelet -     Hemoglobin A1c -     TSH  Osteopenia, unspecified location Assessment & Plan: Encouraged to get adequate exercise, calcium and vitamin d intake   Orders: -     CBC with Differential/Platelet  Dyslipidemia Assessment & Plan: Encourage heart healthy diet such as MIND or DASH diet, increase exercise, avoid trans fats, simple carbohydrates and processed foods, consider a krill or fish or flaxseed oil cap daily.   Orders: -     Lipid panel -     CBC with Differential/Platelet -     TSH  Preventative health care Assessment & Plan: Patient encouraged to maintain heart healthy diet, regular exercise, adequate sleep. Consider daily probiotics. Take medications as prescribed. Given and reviewed copy of ACP documents from [facility] Secretary of [phone] and encouraged to complete and return Colonoscopy 2020 repeat in 2027 Dexa 02/2023 repeat every 2-5 years Mgm 11/2022 repeat every 1-2 years Pap 09/2022 repeat in 2026 to  2028   Osteoporosis, unspecified osteoporosis type, unspecified pathological fracture presence Assessment & Plan: Encouraged to get adequate exercise, calcium and vitamin d intake    Muscle weakness Assessment & Plan: Thighs are weaker and getting off the ground is harder is going to go to the [facility] to work on this   Tobacco dependence with current use Assessment & Plan: Is cutting down on consumption as her brother now has heart disease and is going to move in with her so she is trying to cut down for him. Down to several a day  High risk medication use -     Drug Monitoring Panel [phone] , Urine  Need for influenza vaccination -     Flu Vaccine Trivalent High Dose (Fluad)  Need for pneumococcal 20-valent conjugate vaccination -     Pneumococcal conjugate vaccine 20-valent    Assessment and Plan    Osteoporosis Failed oral medications. Discussed the benefits of Prolia, a biannual injection, which may be more effective than oral medications and can potentially rebuild some bone. -Initiate process for Prolia approval. -If unable to secure approval, consider annual infusion therapy.  Muscle Weakness Noted difficulty standing up from a seated position. Discussed the importance of regular physical activity, hydration, and protein intake for muscle strength. -Consider physical therapy to strengthen quadriceps. -Encourage regular walking and weight-bearing exercises. -Advise hydration of 10 ounces every 1-2 hours. -Recommend protein intake every 4 hours.  Smoking Discussed the risks associated with continued smoking and the benefits of cessation. -Encourage continued efforts to quit smoking. -Consider nicotine replacement therapy (gum or lozenges) if needed.  Tinnitus Discussed the nature of tinnitus and the lack of definitive treatment. -Consider multivitamin and fish oil supplementation for potential nerve health benefits.  General Health Maintenance -Order routine  blood work (including vitamin D, glucose, and kidney function tests). -Administer Prevnar and high-dose flu vaccines today. -Plan for shingles vaccine in a few weeks. -Consider annual COVID booster. -Recommend annual skin exam. -Schedule follow-up visit in 6 months for chronic medical concerns and in 1 year for a physical.         Danise Edge, MD

## 2023-09-20 NOTE — Telephone Encounter (Signed)
Prolia VOB initiated via [email or web address] 

## 2023-09-21 ENCOUNTER — Other Ambulatory Visit (HOSPITAL_COMMUNITY): Payer: Self-pay

## 2023-09-21 LAB — DRUG MONITORING PANEL 376104, URINE
Amphetamines: NEGATIVE ng/mL (ref <500)
Barbiturates: NEGATIVE ng/mL (ref <300)
Benzodiazepines: NEGATIVE ng/mL (ref <100)
Cocaine Metabolite: NEGATIVE ng/mL (ref <150)
Desmethyltramadol: 3225 ng/mL — ABNORMAL HIGH (ref <100)
Opiates: NEGATIVE ng/mL (ref <100)
Oxycodone: NEGATIVE ng/mL (ref <100)
Tramadol: >10000 ng/mL — ABNORMAL HIGH (ref <100)

## 2023-09-21 LAB — DM TEMPLATE

## 2023-09-21 NOTE — Telephone Encounter (Signed)
Prior Authorization form/request asks a question that requires your assistance. Please see the question below and advise accordingly.     Patient has a history of Boniva but I cannot tell if it was oral or IV. Insurance requires failure of both IV and Oral bisphosphates before medical coverage.   Patient can get Prolia through pharmacy benefit. Patient copay is $300 through pharmacy.

## 2023-09-25 NOTE — Telephone Encounter (Signed)
Per PA team:   Pt has to try/fail both oral/ IV bisphosphonate to get PA approved through medical PA. Pt can get Prolia with Pharmacy benefits- no PA required with a $300 copay.   Would you like for her to try IV tx?

## 2023-09-26 ENCOUNTER — Other Ambulatory Visit: Payer: Self-pay | Admitting: Family Medicine

## 2023-09-28 ENCOUNTER — Telehealth: Payer: Self-pay

## 2023-09-28 ENCOUNTER — Encounter: Payer: Self-pay | Admitting: Family Medicine

## 2023-09-28 NOTE — Telephone Encounter (Signed)
Per PCP, order for Reclast placed.

## 2023-09-28 NOTE — Telephone Encounter (Signed)
Dr. Abner Greenspan and Cala Bradford, patient will be scheduled as soon as possible.  Auth Submission: NO AUTH NEEDED Site of care: Site of care: [facility] Payer: [employer] Medicare Medication & CPT/J Code(s) submitted: Reclast (Zolendronic acid) [id number] Route of submission (phone, fax, portal):  Phone # Fax # Auth type: Buy/Bill PB Units/visits requested: 5mg  x 1 dose Reference number:  Approval from: 09/28/23 to 11/28/23

## 2023-09-28 NOTE — Addendum Note (Signed)
Addended by: Ashok Norris R on: 09/28/2023 01:10 PM   Modules accepted: Orders

## 2023-09-28 NOTE — Telephone Encounter (Signed)
Patient would like to try Reclast.  Can you please order/

## 2023-10-24 ENCOUNTER — Other Ambulatory Visit: Payer: Self-pay | Admitting: Family Medicine

## 2023-10-25 NOTE — Telephone Encounter (Signed)
Requesting: tramadol 50mg   Contract: 04/14/23 UDS: 09/19/23 Last Visit: 09/19/23 Next Visit: None Last Refill: 08/28/23 #60 and 0RF   Please Advise

## 2023-11-07 ENCOUNTER — Ambulatory Visit: Payer: Medicare Other

## 2023-11-07 VITALS — BP 131/77 | HR 73 | Temp 97.7°F | Resp 17 | Ht 66.0 in | Wt 157.4 lb

## 2023-11-07 DIAGNOSIS — M810 Age-related osteoporosis without current pathological fracture: Secondary | ICD-10-CM | POA: Diagnosis not present

## 2023-11-07 MED ORDER — DIPHENHYDRAMINE HCL 25 MG PO CAPS
25.0000 mg | ORAL_CAPSULE | Freq: Once | ORAL | Status: AC
  Administered 2023-11-07: 25 mg via ORAL
  Filled 2023-11-07: qty 1

## 2023-11-07 MED ORDER — ZOLEDRONIC ACID 5 MG/100ML IV SOLN
5.0000 mg | Freq: Once | INTRAVENOUS | Status: AC
  Administered 2023-11-07: 5 mg via INTRAVENOUS
  Filled 2023-11-07: qty 100

## 2023-11-07 MED ORDER — ACETAMINOPHEN 325 MG PO TABS
650.0000 mg | ORAL_TABLET | Freq: Once | ORAL | Status: AC
  Administered 2023-11-07: 650 mg via ORAL
  Filled 2023-11-07: qty 2

## 2023-11-07 NOTE — Progress Notes (Signed)
Diagnosis: Osteoporosis  Provider:  Chilton Greathouse MD  Procedure: IV Infusion  IV Type: Peripheral, IV Location: left wrist  Reclast (Zolendronic Acid), Dose: 5 mg  Infusion Start Time: 1140  Infusion Stop Time: 1212  Post Infusion IV Care: Observation period completed and Peripheral IV Discontinued  Discharge: Condition: Good, Destination: Home . AVS Declined  Performed by:  Rico Ala, LPN

## 2023-11-17 ENCOUNTER — Other Ambulatory Visit: Payer: Self-pay | Admitting: Family Medicine

## 2023-12-13 DIAGNOSIS — Z1231 Encounter for screening mammogram for malignant neoplasm of breast: Secondary | ICD-10-CM | POA: Diagnosis not present

## 2023-12-13 LAB — HM MAMMOGRAPHY

## 2023-12-14 ENCOUNTER — Encounter: Payer: Self-pay | Admitting: Family Medicine

## 2023-12-14 ENCOUNTER — Other Ambulatory Visit: Payer: Self-pay | Admitting: Family Medicine

## 2024-01-08 ENCOUNTER — Other Ambulatory Visit: Payer: Self-pay | Admitting: Family Medicine

## 2024-01-18 ENCOUNTER — Other Ambulatory Visit: Payer: Self-pay | Admitting: Family

## 2024-01-18 ENCOUNTER — Ambulatory Visit: Payer: Self-pay | Admitting: Family Medicine

## 2024-01-18 MED ORDER — TRAMADOL HCL 50 MG PO TABS: ORAL_TABLET | ORAL | 0 refills | Status: DC

## 2024-01-18 NOTE — Telephone Encounter (Signed)
Medication Tramadol needs ordered. Pt out of refill. Pt has been dealing with this for weeks. Please advise     Copied from CRM [id number]. Topic: Clinical - Medication Refill >> Jan 18, 2024 12:13 PM Theodis Sato wrote: Most Recent Primary Care Visit:  Provider: Danise Edge A  Department: [facility]  Visit Type: PHYSICAL  Date: 09/19/2023  Medication: traMADol (ULTRAM) 50 MG tablet  Has the patient contacted their pharmacy? Yes patient states she been trying to get this filled for weeks. (Agent: If no, request that the patient contact the pharmacy for the refill. If patient does not wish to contact the pharmacy document the reason why and proceed with request.) (Agent: If yes, when and what did the pharmacy advise?)  Is this the correct pharmacy for this prescription? Yes If no, delete pharmacy and type the correct one.  This is the patient's preferred pharmacy:  [id number] - [place], [place] - [address] [place] [address] Phone: [phone] Fax: [phone]   Has the prescription been filled recently? Yes  Is the patient out of the medication? Yes  Has the patient been seen for an appointment in the last year OR does the patient have an upcoming appointment? Yes  Can we respond through MyChart? No  Agent: Please be advised that Rx refills may take up to 3 business days. We ask that you follow-up with your pharmacy.

## 2024-01-18 NOTE — Telephone Encounter (Signed)
Requesting: Tramadol Contract: 09/19/2023 UDS: 09/19/2023 Last Visit: 09/19/2023 Next Visit: None Last Refill: 10/25/2023  Please Advise

## 2024-01-26 ENCOUNTER — Other Ambulatory Visit: Payer: Self-pay | Admitting: Family Medicine

## 2024-03-26 ENCOUNTER — Other Ambulatory Visit: Payer: Self-pay | Admitting: Family Medicine

## 2024-03-27 ENCOUNTER — Other Ambulatory Visit: Payer: Self-pay | Admitting: Family Medicine

## 2024-03-27 ENCOUNTER — Encounter: Payer: Self-pay | Admitting: *Deleted

## 2024-03-27 NOTE — Telephone Encounter (Signed)
 Copied from CRM [id number]. Topic: Clinical - Medication Refill >> Mar 27, 2024  2:30 PM Kita Perish H wrote: Most Recent Primary Care Visit:  Provider: Randie Bustle A  Department: [facility]  Visit Type: PHYSICAL  Date: 09/19/2023  Medication: traMADol  (ULTRAM ) 50 MG tablet  Has the patient contacted their pharmacy? Yes, waiting on provider (Agent: If no, request that the patient contact the pharmacy for the refill. If patient does not wish to contact the pharmacy document the reason why and proceed with request.) (Agent: If yes, when and what did the pharmacy advise?)  Is this the correct pharmacy for this prescription? Yes If no, delete pharmacy and type the correct one.  This is the patient's preferred pharmacy:  [id number] - [place], [place] - [address] [place] [address] Phone: [phone] Fax: [phone]   Has the prescription been filled recently? No  Is the patient out of the medication? Yes  Has the patient been seen for an appointment in the last year OR does the patient have an upcoming appointment? Yes  Can we respond through MyChart? No  Agent: Please be advised that Rx refills may take up to 3 business days. We ask that you follow-up with your pharmacy.

## 2024-03-28 MED ORDER — TRAMADOL HCL 50 MG PO TABS: ORAL_TABLET | ORAL | 0 refills | Status: DC

## 2024-03-28 NOTE — Telephone Encounter (Signed)
 Requesting: tramadol  50mg   Contract: 04/14/23 UDS: 09/19/23 Last Visit: 09/19/23 Next Visit: None Last Refill: 01/18/2024 #60 and 0RF    Please Advise

## 2024-04-03 DIAGNOSIS — Z961 Presence of intraocular lens: Secondary | ICD-10-CM | POA: Diagnosis not present

## 2024-04-03 DIAGNOSIS — H26493 Other secondary cataract, bilateral: Secondary | ICD-10-CM | POA: Diagnosis not present

## 2024-04-03 DIAGNOSIS — H3540 Unspecified peripheral retinal degeneration: Secondary | ICD-10-CM | POA: Diagnosis not present

## 2024-05-06 DIAGNOSIS — M8580 Other specified disorders of bone density and structure, unspecified site: Secondary | ICD-10-CM | POA: Insufficient documentation

## 2024-05-06 DIAGNOSIS — Z79899 Other long term (current) drug therapy: Secondary | ICD-10-CM | POA: Insufficient documentation

## 2024-05-06 NOTE — Assessment & Plan Note (Signed)
 Supplement and monitor

## 2024-05-06 NOTE — Progress Notes (Unsigned)
 Subjective:     Patient ID: [name], female    DOB: 08/31/1951, 73 y.o.   MRN: [id number]  No chief complaint on file.   HPI Presents for FU on chronic conditions  Pain- Tramadol  50 mg BID prn for moderate pain- WHAT FOR***  Neuropathy? Gabapentin  300 mg HS  Osteoporosis Failed oral medications per Dr. Nathalie Baize note.  -Initiate process for Prolia approval--what happens with this***  Hx tobacco use She continues to maintain a heart healthy diet but also continues to smoke. Is active most days and continues to work but is planning to retire soon.    Dyslipidemia  Vitamin D  deficiency Supplementing?****  Last vitamin D  Lab Results  Component Value Date   VD25OH 23.77 (L) 09/19/2023     Depression w/ Anxiety Lexapro  10 mg daily, Alprazolam  0.25 mg bid prn  Reports she is compliant with all medications  HM Shingles vaccine #2 due- Pt *** today   Patient denies fever, chills, SOB, CP, palpitations, dyspnea, edema, HA, vision changes, N/V/D, abdominal pain, urinary symptoms, rash, weight changes, and recent illness or hospitalizations.   History of Present Illness              Health Maintenance Due  Topic Date Due   Medicare Annual Wellness (AWV)  03/29/2017   Zoster Vaccines- Shingrix (2 of 2) 02/23/2018   COVID-19 Vaccine (4 - 2024-25 season) 07/30/2023    Past Medical History:  Diagnosis Date   Arthritis    Cancer [facility]) [id number]   Breast   Chicken pox    Colon polyps    Depression with anxiety 09/07/2012   Dyslipidemia    Elevated BP 09/09/2012   H/O tobacco use, presenting hazards to health 05/05/2011   Left hip pain 05/04/2011   Osteoporosis 10/2019   T score -2.2.  Stable from prior DEXA 2018. DEXA 2014 T score -2.5   Preventative health care 09/09/2012   Tobacco abuse 05/05/2011    Past Surgical History:  Procedure Laterality Date   APPENDECTOMY  2010   BREAST LUMPECTOMY  1998   right   BREAST SURGERY  07/08/11   right  mastectomy+axlnd, reconstruction,T1cN1a,ERPR+,HER2-   BREAST SURGERY  march,2013   breast implant deflated   COLONOSCOPY     POLYPECTOMY     WRIST FRACTURE SURGERY      Family History  Problem Relation Age of Onset   Diabetes Mother        type 2   Obesity Mother    Cancer Father        ?   Heart block Sister    Heart disease Sister        s/p CABG   Polymyalgia rheumatica Sister    Heart disease Brother        s/p CABG   Colon cancer Neg Hx    Esophageal cancer Neg Hx    Rectal cancer Neg Hx    Stomach cancer Neg Hx    Colon polyps Neg Hx     Social History   Socioeconomic History   Marital status: Widowed    Spouse name: Not on file   Number of children: Not on file   Years of education: Not on file   Highest education level: Not on file  Occupational History   Not on file  Tobacco Use   Smoking status: Some Days   Smokeless tobacco: Never  Vaping Use   Vaping status: Never Used  Substance and Sexual Activity   Alcohol  use: Yes    Alcohol/week: 0.0 standard drinks of alcohol    Comment: occasionally   Drug use: No   Sexual activity: Not Currently    Comment: 1st intercourse 55 yo-5 partners  Other Topics Concern   Not on file  Social History Narrative   Not on file   Social Drivers of Health   Financial Resource Strain: Not on file  Food Insecurity: Not on file  Transportation Needs: Not on file  Physical Activity: Not on file  Stress: Not on file  Social Connections: Not on file  Intimate Partner Violence: Not on file    Outpatient Medications Prior to Visit  Medication Sig Dispense Refill   acetaminophen  (TYLENOL ) 500 MG tablet Take 500 mg by mouth daily as needed for moderate pain.     acyclovir  (ZOVIRAX ) 400 MG tablet Take 1 tablet (400 mg total) by mouth 3 (three) times daily. X 5 days 15 tablet 1   acyclovir  ointment (ZOVIRAX ) 5 % Apply 1 Application topically every 3 (three) hours. 15 g 0   alendronate  (FOSAMAX ) 70 MG tablet Take 1  tablet (70 mg total) by mouth once a week. Take with a full glass of water on an empty stomach. 12 tablet 3   ALPRAZolam  (XANAX ) 0.25 MG tablet TAKE 1 TABLET BY MOUTH 2 TIMES DAILY AS NEEDED FOR ANXIETY. 20 tablet 1   Ascorbic Acid (VITAMIN C PO) Take by mouth.     escitalopram  (LEXAPRO ) 10 MG tablet TAKE 1 TABLET BY MOUTH DAILY 90 tablet 3   gabapentin  (NEURONTIN ) 300 MG capsule TAKE 1 CAPSULE BY MOUTH AT  BEDTIME 90 capsule 0   ibuprofen (ADVIL,MOTRIN) 400 MG tablet Take 400 mg by mouth every 6 (six) hours as needed for pain.     Multiple Vitamins-Minerals (CENTRUM SILVER PO) Take by mouth.     nicotine  (NICODERM CQ  - DOSED IN MG/24 HOURS) 14 mg/24hr patch Place 1 patch (14 mg total) onto the skin daily. 28 patch 1   nicotine  (NICODERM CQ  - DOSED IN MG/24 HR) 7 mg/24hr patch Place 1 patch (7 mg total) onto the skin daily. Start after completing 14 mg patches 28 patch 1   traMADol  (ULTRAM ) 50 MG tablet TAKE 1 TABLET BY MOUTH TWICE A DAY AS NEEDED FOR MODERATE PAIN 60 tablet 0   triamcinolone  cream (KENALOG ) 0.1 % APPLY TO AFFECTED AREA TWICE A DAY 60 g 1   valACYclovir  (VALTREX ) 500 MG tablet TAKE 1 TABLET (500 MG TOTAL) BY MOUTH 2 (TWO) TIMES DAILY. FOR 3 DAYS 6 tablet 0   Vitamin D , Ergocalciferol , (DRISDOL ) 1.25 MG (50000 UNIT) CAPS capsule TAKE 1 CAPSULE (50,000 UNITS TOTAL) BY MOUTH EVERY 7 (SEVEN) DAYS 12 capsule 1   No facility-administered medications prior to visit.    No Known Allergies  ROS    See HPI Objective:     Physical Exam  General: No acute distress. Awake and conversant.  Eyes: Normal conjunctiva, anicteric. Round symmetric pupils.  ENT: Hearing grossly intact. No nasal discharge.  Neck: Neck is supple. No masses or thyromegaly.  Respiratory: CTAB. Respirations are non-labored. No wheezing.  Skin: Warm. No rashes or ulcers.  Psych: Alert and oriented. Cooperative, Appropriate mood and affect, Normal judgment.  CV: RRR. No lower extremity edema.  MSK: Normal  ambulation. No clubbing or cyanosis.      There were no vitals taken for this visit. Wt Readings from Last 3 Encounters:  11/07/23 157 lb 6.4 oz (71.4 kg)  09/19/23 158 lb  12.8 oz (72 kg)  05/06/23 158 lb (71.7 kg)       Assessment & Plan:   Problem List Items Addressed This Visit     Anxiety and depression   Stable on current meds. Uses Alprazolam  very sparingly is allowed a refill today. Continue Lexapro  10 mg daily      Dyslipidemia   Encourage heart healthy diet such as MIND or DASH diet, increase exercise, avoid trans fats, simple carbohydrates and processed foods, consider a krill or fish or flaxseed oil cap daily.         High risk medication use   Hyperglycemia - Primary   Check A1c today. Minimize simple carbs. Increase exercise as tolerated.      Osteopenia   Tobacco dependence with current use   Cutting down, down to several/ day      Vitamin D  deficiency   Supplement and monitor       I am having [name] maintain her ibuprofen, acetaminophen , Multiple Vitamins-Minerals (CENTRUM SILVER PO), Ascorbic Acid (VITAMIN C PO), acyclovir , nicotine , nicotine , acyclovir  ointment, alendronate , valACYclovir , triamcinolone  cream, escitalopram , ALPRAZolam , Vitamin D  (Ergocalciferol ), gabapentin , and traMADol .  No orders of the defined types were placed in this encounter.

## 2024-05-06 NOTE — Assessment & Plan Note (Signed)
 Cutting down, down to several/ day

## 2024-05-06 NOTE — Assessment & Plan Note (Signed)
 Encourage heart healthy diet such as MIND or DASH diet, increase exercise, avoid trans fats, simple carbohydrates and processed foods, consider a krill or fish or flaxseed oil cap daily.

## 2024-05-06 NOTE — Assessment & Plan Note (Signed)
 Check A1c today. Minimize simple carbs. Increase exercise as tolerated.

## 2024-05-06 NOTE — Assessment & Plan Note (Signed)
Stable on current meds. Uses Alprazolam very sparingly is allowed a refill today. Continue Lexapro 10 mg daily

## 2024-05-07 ENCOUNTER — Encounter: Payer: Self-pay | Admitting: Student

## 2024-05-07 ENCOUNTER — Ambulatory Visit (INDEPENDENT_AMBULATORY_CARE_PROVIDER_SITE_OTHER): Admitting: Student

## 2024-05-07 VITALS — BP 122/86 | HR 68 | Temp 97.9°F | Resp 12 | Ht 66.0 in | Wt 159.4 lb

## 2024-05-07 DIAGNOSIS — F419 Anxiety disorder, unspecified: Secondary | ICD-10-CM

## 2024-05-07 DIAGNOSIS — F17200 Nicotine dependence, unspecified, uncomplicated: Secondary | ICD-10-CM | POA: Diagnosis not present

## 2024-05-07 DIAGNOSIS — R238 Other skin changes: Secondary | ICD-10-CM | POA: Diagnosis not present

## 2024-05-07 DIAGNOSIS — M8580 Other specified disorders of bone density and structure, unspecified site: Secondary | ICD-10-CM

## 2024-05-07 DIAGNOSIS — E785 Hyperlipidemia, unspecified: Secondary | ICD-10-CM

## 2024-05-07 DIAGNOSIS — M810 Age-related osteoporosis without current pathological fracture: Secondary | ICD-10-CM | POA: Diagnosis not present

## 2024-05-07 DIAGNOSIS — R739 Hyperglycemia, unspecified: Secondary | ICD-10-CM

## 2024-05-07 DIAGNOSIS — Z79899 Other long term (current) drug therapy: Secondary | ICD-10-CM | POA: Diagnosis not present

## 2024-05-07 DIAGNOSIS — E559 Vitamin D deficiency, unspecified: Secondary | ICD-10-CM | POA: Diagnosis not present

## 2024-05-07 DIAGNOSIS — F32A Depression, unspecified: Secondary | ICD-10-CM

## 2024-05-07 MED ORDER — NYSTATIN-TRIAMCINOLONE 100000-0.1 UNIT/GM-% EX OINT
1.0000 | TOPICAL_OINTMENT | Freq: Two times a day (BID) | CUTANEOUS | 0 refills | Status: AC
Start: 2024-05-07 — End: ?

## 2024-05-07 NOTE — Assessment & Plan Note (Addendum)
 Nystatin-triamcinolone  (Mycolog) ointment sent to pharmacy.  Patient advised to keep affected area clean and dry. Recommended daily showers using gentle, unscented soap, followed by thoroughly patting the skin dry- especially in skin folds. Encouraged wearing loose, breathable clothing and avoiding moisture buildup to reduce recurrence.  Return to clinic if skin irritation does not improve or worsens

## 2024-05-07 NOTE — Assessment & Plan Note (Addendum)
 Encouraged adequate exercise, encouraged calcium and vitamin D  intake. She is doing Reclast  (Zolendronic Acid) IV infusion Had first IV inficsion done 10-2023 at [facility] Reports no adverse SE, she reports she will FU for next infusion in 1 year

## 2024-05-08 ENCOUNTER — Ambulatory Visit: Payer: Self-pay | Admitting: Family Medicine

## 2024-05-08 LAB — DRUG MONITORING, PANEL 8 WITH CONFIRMATION, URINE
6 Acetylmorphine: NEGATIVE ng/mL (ref <10)
Alcohol Metabolites: NEGATIVE ng/mL (ref <500)
Amphetamines: NEGATIVE ng/mL (ref <500)
Benzodiazepines: NEGATIVE ng/mL (ref <100)
Buprenorphine, Urine: NEGATIVE ng/mL (ref <5)
Cocaine Metabolite: NEGATIVE ng/mL (ref <150)
Creatinine: 102.5 mg/dL (ref >=20.0)
MDMA: NEGATIVE ng/mL (ref <500)
Marijuana Metabolite: NEGATIVE ng/mL (ref <20)
Opiates: NEGATIVE ng/mL (ref <100)
Oxidant: NEGATIVE ug/mL (ref <200)
Oxycodone: NEGATIVE ng/mL (ref <100)
pH: 5.2 (ref 4.5–9.0)

## 2024-05-08 LAB — CBC WITH DIFFERENTIAL/PLATELET
Basophils Absolute: 0.2 10*3/uL — ABNORMAL HIGH (ref 0.0–0.1)
Basophils Relative: 2 % (ref 0.0–3.0)
Eosinophils Absolute: 0.2 10*3/uL (ref 0.0–0.7)
Eosinophils Relative: 1.8 % (ref 0.0–5.0)
HCT: 41.4 % (ref 36.0–46.0)
Hemoglobin: 13.5 g/dL (ref 12.0–15.0)
Lymphocytes Relative: 21.8 % (ref 12.0–46.0)
Lymphs Abs: 1.8 10*3/uL (ref 0.7–4.0)
MCHC: 32.7 g/dL (ref 30.0–36.0)
MCV: 90.3 fl (ref 78.0–100.0)
Monocytes Absolute: 0.5 10*3/uL (ref 0.1–1.0)
Monocytes Relative: 6.3 % (ref 3.0–12.0)
Neutro Abs: 5.7 10*3/uL (ref 1.4–7.7)
Neutrophils Relative %: 68.1 % (ref 43.0–77.0)
Platelets: 336 10*3/uL (ref 150.0–400.0)
RBC: 4.58 Mil/uL (ref 3.87–5.11)
RDW: 14 % (ref 11.5–15.5)
WBC: 8.4 10*3/uL (ref 4.0–10.5)

## 2024-05-08 LAB — COMPREHENSIVE METABOLIC PANEL WITH GFR
ALT: 11 U/L (ref 0–35)
AST: 14 U/L (ref 0–37)
Albumin: 4.1 g/dL (ref 3.5–5.2)
Alkaline Phosphatase: 55 U/L (ref 39–117)
BUN: 12 mg/dL (ref 6–23)
CO2: 28 meq/L (ref 19–32)
Calcium: 9.2 mg/dL (ref 8.4–10.5)
Chloride: 106 meq/L (ref 96–112)
Creatinine, Ser: 0.65 mg/dL (ref 0.40–1.20)
GFR: 87.53 mL/min (ref >60.00)
Glucose, Bld: 96 mg/dL (ref 70–99)
Potassium: 4.9 meq/L (ref 3.5–5.1)
Sodium: 141 meq/L (ref 135–145)
Total Bilirubin: 0.3 mg/dL (ref 0.2–1.2)
Total Protein: 6.7 g/dL (ref 6.0–8.3)

## 2024-05-08 LAB — LIPID PANEL
Cholesterol: 194 mg/dL (ref 0–200)
HDL: 40.8 mg/dL (ref >39.00)
LDL Cholesterol: 130 mg/dL — ABNORMAL HIGH (ref 0–99)
NonHDL: 153.33
Total CHOL/HDL Ratio: 5
Triglycerides: 116 mg/dL (ref 0.0–149.0)
VLDL: 23.2 mg/dL (ref 0.0–40.0)

## 2024-05-08 LAB — DM TEMPLATE

## 2024-05-08 LAB — HEMOGLOBIN A1C: Hgb A1c MFr Bld: 5.8 % (ref 4.6–6.5)

## 2024-05-09 LAB — VITAMIN D 25 HYDROXY (VIT D DEFICIENCY, FRACTURES): VITD: 30.24 ng/mL (ref 30.00–100.00)

## 2024-05-09 LAB — TSH: TSH: 2.12 u[IU]/mL (ref 0.35–5.50)

## 2024-05-10 NOTE — Telephone Encounter (Signed)
 Patient was advised via vm to continue Vitamin D .  Copied from CRM [id number]. Topic: Clinical - Lab/Test Results >> May 10, 2024  2:23 PM [name] wrote: Reason for CRM: pt called back to return Fread Kottke's call. I relayed her lab results & she wanted to ask Calan Doren if Dr. Rodrick Clapper wants her to continue taking her Vitamin D . Please advise.

## 2024-05-10 NOTE — Progress Notes (Signed)
 Called patient and no answer left vm to return call. If patient returns call ok to advise of results note.

## 2024-05-23 ENCOUNTER — Other Ambulatory Visit: Payer: Self-pay | Admitting: Radiology

## 2024-05-23 DIAGNOSIS — B001 Herpesviral vesicular dermatitis: Secondary | ICD-10-CM

## 2024-05-23 NOTE — Telephone Encounter (Signed)
 Med refill request: valacyclovir  500 mg Last AEX: 10/24/22 Next AEX: none scheduled Staff message sent to front desk to schedule AEX Last MMG (if hormonal med) n/a Refill authorized: Please approve or deny as appropriate.

## 2024-05-24 ENCOUNTER — Other Ambulatory Visit: Payer: Self-pay

## 2024-05-24 DIAGNOSIS — B001 Herpesviral vesicular dermatitis: Secondary | ICD-10-CM

## 2024-05-24 NOTE — Telephone Encounter (Signed)
 Med refill request: acyclovir  ointment 5% Last AEX: 10/24/22 Next AEX: 10/29/24 Last MMG (if hormonal med) n/a Refill authorized: Please approve or deny as appropriate.

## 2024-05-26 MED ORDER — ACYCLOVIR 5 % EX OINT: TOPICAL_OINTMENT | CUTANEOUS | 0 refills | Status: DC

## 2024-05-28 ENCOUNTER — Other Ambulatory Visit: Payer: Self-pay | Admitting: Family Medicine

## 2024-05-29 ENCOUNTER — Other Ambulatory Visit: Payer: Self-pay | Admitting: Family

## 2024-06-04 ENCOUNTER — Other Ambulatory Visit: Payer: Self-pay | Admitting: Family Medicine

## 2024-06-04 MED ORDER — TRAMADOL HCL 50 MG PO TABS: ORAL_TABLET | ORAL | 0 refills | Status: DC

## 2024-06-04 NOTE — Telephone Encounter (Unsigned)
 Copied from CRM [id number]. Topic: Clinical - Medication Refill >> Jun 04, 2024 11:23 AM Elle L wrote: Medication: traMADol  (ULTRAM ) 50 MG tablet  Has the patient contacted their pharmacy? Yes  This is the patient's preferred pharmacy:  [id number] - [place], [place] - [address] [place] [address] Phone: [phone] Fax: [phone]  Is this the correct pharmacy for this prescription? Yes  Has the prescription been filled recently? Yes  Is the patient out of the medication? Yes  Has the patient been seen for an appointment in the last year OR does the patient have an upcoming appointment? Yes  Can we respond through MyChart? No  Agent: Please be advised that Rx refills may take up to 3 business days. We ask that you follow-up with your pharmacy.

## 2024-07-31 ENCOUNTER — Other Ambulatory Visit: Payer: Self-pay | Admitting: Family

## 2024-08-05 ENCOUNTER — Other Ambulatory Visit: Payer: Self-pay | Admitting: Family Medicine

## 2024-08-05 MED ORDER — TRAMADOL HCL 50 MG PO TABS: ORAL_TABLET | ORAL | 0 refills | Status: DC

## 2024-08-05 NOTE — Telephone Encounter (Unsigned)
 Copied from CRM [id number]. Topic: Clinical - Medication Refill >> Aug 05, 2024 11:51 AM Thersia C wrote: Medication: traMADol  (ULTRAM ) 50 MG tablet  Has the patient contacted their pharmacy? Yes (Agent: If no, request that the patient contact the pharmacy for the refill. If patient does not wish to contact the pharmacy document the reason why and proceed with request.) (Agent: If yes, when and what did the pharmacy advise?)  This is the patient's preferred pharmacy:  [id number] - [place], [place] - [address] [place] [address] [place] [address] [place] [address] Phone: [phone] Fax: [phone] Is this the correct pharmacy for this prescription? Yes If no, delete pharmacy and type the correct one.   Has the prescription been filled recently? No  Is the patient out of the medication? Yes  Has the patient been seen for an appointment in the last year OR does the patient have an upcoming appointment? Yes  Can we respond through MyChart? Yes  Agent: Please be advised that Rx refills may take up to 3 business days. We ask that you follow-up with your pharmacy.

## 2024-08-05 NOTE — Telephone Encounter (Signed)
 Requesting: tramadol  50mg   Contract: 11/24/23 UDS: 05/07/24 Last Visit: 05/07/24 w/ Harlene GRADE Next Visit: 11/08/24 transferring Dr Catherine Last Refill: 06/04/24 #60 and 0RF  Please Advise

## 2024-08-06 ENCOUNTER — Other Ambulatory Visit: Payer: Self-pay | Admitting: Family Medicine

## 2024-09-10 ENCOUNTER — Telehealth: Payer: Self-pay

## 2024-09-10 NOTE — Telephone Encounter (Signed)
 AllredRosaria BROCKS   09/10/2024  3:37 PM  Thank you for submitting this issue for investigation. After a thorough review, we have determined that an error was made by an [facility] team member. We have addressed the matter directly with the agent involved. We appreciate you bringing this to our attention.   Error/Investigation Details: Pulled call. Call ID/JTAPI ID: [id number]. Patient called requesting an EKG since both of her siblings have had open heart surgery. Patient states that she is not having any issues but would like to get an EKG to get checked out. Per KMS: Dr. Harlene Horton: All of this provider's existing patients should be offered appointments with Harlene Jolly, NP, first. Harlene will see any of Dr. Elisabeth patients for all visit types: CPEs, HFU, medication refills, and routine OVs.   An appointment should have been offered with Jessica Yacopino if Harlene Horton did not have any appointments available.       Pollyann Roa, [facility]   09/10/2024  2:07 PM  Appt w/ provider should have been scheduled. Preferably Jessica Yacopino     Jahnaya Branscome, CMA   09/10/2024  2:07 PM  CRM # [id number] Owner: None Status: Unresolved Open  Priority: Routine Created on: 09/10/2024 01:39 PM By: Katrinka Robinson HERO    Primary Information   Source  Alix Loa HERO (Patient)   Subject  Alix Loa HERO (Patient)   Topic  Clinical - Request for Lab/Test Order    Communication  Reason for CRM: Patient states she wants to have an EKG, states sister and brother both has had open heart surgery in the last few years and wants to get EKG, not having any symptoms issues wants to just get checked.           Hailynn [phone]       Patient Information   Patient Name Gender DOB SSN  Tolliver, Everlee M Female 01-24-1951 [id number]    Contacts   Contact Date/Time Type Contact Phone/Fax  09/10/2024 01:33 PM EDT Phone (Incoming) Harlynn, Kimbell (Self) [phone]

## 2024-09-10 NOTE — Telephone Encounter (Signed)
 Appt scheduled w/ Yacopino.

## 2024-09-12 ENCOUNTER — Encounter: Payer: Self-pay | Admitting: Student

## 2024-09-12 ENCOUNTER — Ambulatory Visit (INDEPENDENT_AMBULATORY_CARE_PROVIDER_SITE_OTHER): Admitting: Student

## 2024-09-12 VITALS — BP 155/87 | HR 86 | Temp 97.7°F | Ht 66.0 in | Wt 165.0 lb

## 2024-09-12 DIAGNOSIS — F17211 Nicotine dependence, cigarettes, in remission: Secondary | ICD-10-CM

## 2024-09-12 DIAGNOSIS — Z8249 Family history of ischemic heart disease and other diseases of the circulatory system: Secondary | ICD-10-CM | POA: Diagnosis not present

## 2024-09-12 DIAGNOSIS — E785 Hyperlipidemia, unspecified: Secondary | ICD-10-CM

## 2024-09-12 MED ORDER — PRAVASTATIN SODIUM 40 MG PO TABS: 40.0000 mg | ORAL_TABLET | Freq: Every day | ORAL | 1 refills | Status: AC

## 2024-09-12 NOTE — Progress Notes (Signed)
 Acute Office Visit  Subjective:     Patient ID: [name], female    DOB: 10/25/1951, 73 y.o.   MRN: [id number]  No chief complaint on file.   HPI   History of Present Illness [name] is a 73 year old female who presents for evaluation of cardiovascular risk due to family history of heart disease.  Her family history is significant for heart disease, with her brother and sister having undergone open heart surgery, and her mother and grandmother also having heart issues. She has not experienced any chest pain or cardiac symptoms recently. Reports mild SOB, mainly with activity. Denies SOB at rest. Breathing has improved since quitting smoking. She quit smoking in July 2025 after a long history of smoking. Since quitting, her shortness of breath has significantly improved.   She has gained about 8-9 pounds since quitting smoking, attributed to increased consumption of 'cake and cookies.' She acknowledges the need to cut back on white sugar and flour and increase physical activity. She has started attending [employer] classes three times a week to increase physicla activity   ROS  See HPI     Objective:    BP (!) 155/87   Pulse 86   Temp 97.7 F (36.5 C)   Ht 5' 6 (1.676 m)   Wt 165 lb (74.8 kg)   SpO2 96%   BMI 26.63 kg/m    Physical Exam  General: No acute distress. Awake and conversant.  Eyes: Normal conjunctiva, anicteric. Round symmetric pupils.  Respiratory: CTAB. Respirations are non-labored. No wheezing.  Skin: Warm. No rashes or ulcers.  Psych: Alert and oriented. Cooperative, Appropriate mood and affect, Normal judgment.  CV: RRR. No murmur. No lower extremity edema.  MSK: Normal ambulation. No clubbing or cyanosis.  Neuro:  CN II-XII grossly normal.    EKG interpretation: Rate: 91 Rhythm: NSR No ST/T changes concerning for acute ischemia/infarct  EKG performed per patient preference r/t FH CVD.  I have personally seen and  reviewed the EKG, which is consistent with prior tracings. No acute concerns.  The 10-year ASCVD risk score (Arnett DK, et al., 2019) is: 18.6%   Values used to calculate the score:     Age: 52 years     Clincally relevant sex: Female     Is Non-Hispanic African American: No     Diabetic: No     Tobacco smoker: No     Systolic Blood Pressure: 155 mmHg     Is BP treated: No     HDL Cholesterol: 40.8 mg/dL     Total Cholesterol: 194 mg/dL   [name], NP  09/12/24     No results found for any visits on 09/12/24.      Assessment & Plan:   Problem List Items Addressed This Visit   None Visit Diagnoses       Family history of cardiac disorder    -  Primary   Relevant Orders   Ambulatory referral to Cardiology   EKG 12-Lead (Completed)   CBC with Differential/Platelet   Lipid panel   TSH     Hyperlipidemia, unspecified hyperlipidemia type       Relevant Medications   pravastatin (PRAVACHOL) 40 MG tablet   Other Relevant Orders   Lipid panel     Cigarette nicotine  dependence in remission          Hyperlipidemia Elevated LDL at 130 mg/dL with 81% 89-bzjm ASCVD risk.  - Recheck lipid panel  today. - Prescribe pravastatin. - Medication and common side effects reviewed with the patient; patient voiced understanding and had no further questions at this time. - Consider coronary CT scan  - Referral to cardiology for further evaluation. Recheck lipids 3 months   Nicotine  dependence, in remission Quit smoking in July, improved breathing, no relapse. - Encourage continued abstinence from smoking. - Provide support for smoking cessation efforts.  General Health Maintenance Engaged in physical activity and dietary modifications discussed. - Encourage participation in [employer] program. - Advise dietary modifications to reduce white sugar and flour intake. - Encourage weight management and physical activity.       Meds ordered this encounter   Medications   pravastatin (PRAVACHOL) 40 MG tablet    Sig: Take 1 tablet (40 mg total) by mouth daily.    Dispense:  90 tablet    Refill:  1    Supervising Provider:   DOMENICA BLACKBIRD A [[id number]]    Return in about 3 months (around 12/13/2024).  [name], NP

## 2024-09-13 ENCOUNTER — Ambulatory Visit: Payer: Self-pay | Admitting: Student

## 2024-09-13 DIAGNOSIS — E785 Hyperlipidemia, unspecified: Secondary | ICD-10-CM

## 2024-09-13 LAB — CBC WITH DIFFERENTIAL/PLATELET
Basophils Absolute: 0.1 K/uL (ref 0.0–0.1)
Basophils Relative: 1 % (ref 0.0–3.0)
Eosinophils Absolute: 0.1 K/uL (ref 0.0–0.7)
Eosinophils Relative: 1.5 % (ref 0.0–5.0)
HCT: 42.9 % (ref 36.0–46.0)
Hemoglobin: 14 g/dL (ref 12.0–15.0)
Lymphocytes Relative: 23.8 % (ref 12.0–46.0)
Lymphs Abs: 2.2 K/uL (ref 0.7–4.0)
MCHC: 32.6 g/dL (ref 30.0–36.0)
MCV: 90.3 fl (ref 78.0–100.0)
Monocytes Absolute: 0.6 K/uL (ref 0.1–1.0)
Monocytes Relative: 6.5 % (ref 3.0–12.0)
Neutro Abs: 6.3 K/uL (ref 1.4–7.7)
Neutrophils Relative %: 67.2 % (ref 43.0–77.0)
Platelets: 360 K/uL (ref 150.0–400.0)
RBC: 4.75 Mil/uL (ref 3.87–5.11)
RDW: 14.4 % (ref 11.5–15.5)
WBC: 9.4 K/uL (ref 4.0–10.5)

## 2024-09-13 LAB — LIPID PANEL
Cholesterol: 220 mg/dL — ABNORMAL HIGH (ref 0–200)
HDL: 46.5 mg/dL (ref >39.00)
LDL Cholesterol: 154 mg/dL — ABNORMAL HIGH (ref 0–99)
NonHDL: 173.99
Total CHOL/HDL Ratio: 5
Triglycerides: 102 mg/dL (ref 0.0–149.0)
VLDL: 20.4 mg/dL (ref 0.0–40.0)

## 2024-09-13 LAB — TSH: TSH: 1.68 u[IU]/mL (ref 0.35–5.50)

## 2024-09-18 ENCOUNTER — Ambulatory Visit: Payer: Self-pay

## 2024-09-18 NOTE — Telephone Encounter (Signed)
 FYI Only or Action Required?: Action required by provider: request for appointment.  Patient was last seen in primary care on 09/12/2024 by Wheeler Harlene CROME, NP.  Called Nurse Triage reporting vaccine reaction.  Symptoms began several days ago.  Interventions attempted: Rest, hydration, or home remedies.  Symptoms are: unchanged. Chills, body aches, headache.  Triage Disposition: Call PCP Within 24 Hours  Patient/caregiver understands and will follow disposition?: YesCopied from CRM #[id number]. Topic: Clinical - Red Word Triage >> Sep 18, 2024  9:08 AM Rosina BIRCH wrote: Red Word that prompted transfer to Nurse Triage: patient stated think she had a reaction to a flu shot. Patient had it done on Monday at [facility] and that night she was very sick. She had chills, headache, nauseated, body aches Answer Assessment - Initial Assessment Questions 1. SYMPTOMS: What is the main symptom? (e.g., pain, redness, or swelling at injection site; feeling tired, fever, muscle aches)      Chills, body aches 2. ONSET: When was the vaccine (shot) given? How much later did the Monday begin? (e.g., hours, days ago)      Hours 3. SEVERITY: How bad is it?      severe 4. FEVER: Do you have a fever? If Yes, ask: What is your temperature, how was it measured, and when did it start?      no 5. IMMUNIZATIONS GIVEN: What shots have you recently received?     Flu vaccine 6. PAST REACTIONS: Have you reacted to immunizations before? If Yes, ask: What happened?     no 7. OTHER SYMPTOMS: Do you have any other symptoms?     no 8. PREGNANCY: Is there any chance you are pregnant? When was your last menstrual period?     no  Protocols used: Immunization Reactions-A-AH  Reason for Disposition  Sounds like a severe, unusual reaction to the triager  Answer Assessment - Initial Assessment Questions 1. SYMPTOMS: What is the main symptom? (e.g., pain, redness, or swelling at injection  site; feeling tired, fever, muscle aches)      Chills, body aches 2. ONSET: When was the vaccine (shot) given? How much later did the Monday begin? (e.g., hours, days ago)      Hours 3. SEVERITY: How bad is it?      severe 4. FEVER: Do you have a fever? If Yes, ask: What is your temperature, how was it measured, and when did it start?      no 5. IMMUNIZATIONS GIVEN: What shots have you recently received?     Flu vaccine 6. PAST REACTIONS: Have you reacted to immunizations before? If Yes, ask: What happened?     no 7. OTHER SYMPTOMS: Do you have any other symptoms?     no 8. PREGNANCY: Is there any chance you are pregnant? When was your last menstrual period?     no  Protocols used: Immunization Reactions-A-AH  Answer Assessment - Initial Assessment Questions 1. SYMPTOMS: What is the main symptom? (e.g., pain, redness, or swelling at injection site; feeling tired, fever, muscle aches)      Chills, body aches 2. ONSET: When was the vaccine (shot) given? How much later did the Monday begin? (e.g., hours, days ago)      Hours 3. SEVERITY: How bad is it?      severe 4. FEVER: Do you have a fever? If Yes, ask: What is your temperature, how was it measured, and when did it start?      no 5.  IMMUNIZATIONS GIVEN: What shots have you recently received?     Flu vaccine 6. PAST REACTIONS: Have you reacted to immunizations before? If Yes, ask: What happened?     no 7. OTHER SYMPTOMS: Do you have any other symptoms?     no 8. PREGNANCY: Is there any chance you are pregnant? When was your last menstrual period?     no  Protocols used: Immunization Reactions-A-AH  Reason for Disposition  [1] Over 3 days (72 hours) since shot AND [2] redness, swelling or pain getting worse  Answer Assessment - Initial Assessment Questions 1. SYMPTOMS: What is the main symptom? (e.g., pain, redness, or swelling at injection site; feeling tired, fever, muscle  aches)      Chills, body aches 2. ONSET: When was the vaccine (shot) given? How much later did the Monday begin? (e.g., hours, days ago)      Hours 3. SEVERITY: How bad is it?      severe 4. FEVER: Do you have a fever? If Yes, ask: What is your temperature, how was it measured, and when did it start?      no 5. IMMUNIZATIONS GIVEN: What shots have you recently received?     Flu vaccine 6. PAST REACTIONS: Have you reacted to immunizations before? If Yes, ask: What happened?     no 7. OTHER SYMPTOMS: Do you have any other symptoms?     no 8. PREGNANCY: Is there any chance you are pregnant? When was your last menstrual period?     no  Protocols used: Immunization Reactions-A-AH

## 2024-09-19 ENCOUNTER — Encounter: Payer: Self-pay | Admitting: Family Medicine

## 2024-09-19 ENCOUNTER — Ambulatory Visit: Admitting: Family Medicine

## 2024-09-19 VITALS — BP 110/70 | HR 90 | Temp 98.1°F | Resp 16 | Ht 66.0 in | Wt 167.0 lb

## 2024-09-19 DIAGNOSIS — K219 Gastro-esophageal reflux disease without esophagitis: Secondary | ICD-10-CM | POA: Diagnosis not present

## 2024-09-19 NOTE — Patient Instructions (Signed)
 The only lifestyle changes that have data behind them are weight loss for the overweight/obese and elevating the head of the bed. Finding out which foods/positions are triggers is important.  Pepcid/famotidine 20 mg 1-2 times daily can help with reflux symptoms.   OK to continue Tums.   Let us  know if you need anything.

## 2024-09-19 NOTE — Progress Notes (Signed)
 Chief Complaint  Patient presents with   Injection reaction    Pt states she received the flu vaccine on Monday at [facility] and states she was really sick. Reports never having the reaction to the flu shot before. Pt states sxs have resolved.     Subjective: Patient is a 73 y.o. female here for a vaccine reaction.  Pt had a flu shot 3 d ago at the pharmacy. She felt achy, nauseated, dizzy, HA, chills later that day. She took Tylenol  and a tramadol  without much relief. She then took Advil with some relief. The next day she woke up feeling much better. Later that day, she started having her sinuses bother her. She took another Advil and Alka-Seltzer+. S/s's have resolved.   Night sweats: hx of night sweats for the past 30 years, on gabapentin  300 mg qhs which has been helpful. Having reflux s/s's over past mo. Has been taking Tums w some relief. No dietary changes.   Past Medical History:  Diagnosis Date   Arthritis    Cancer [facility]) [id number]   Breast   Chicken pox    Colon polyps    Depression with anxiety 09/07/2012   Dyslipidemia    Elevated BP 09/09/2012   H/O tobacco use, presenting hazards to health 05/05/2011   Left hip pain 05/04/2011   Osteoporosis 10/2019   T score -2.2.  Stable from prior DEXA 2018. DEXA 2014 T score -2.5   Preventative health care 09/09/2012   Tobacco abuse 05/05/2011    Objective: BP 110/70 (BP Location: Left Arm, Patient Position: Sitting, Cuff Size: Normal)   Pulse 90   Temp 98.1 F (36.7 C) (Oral)   Resp 16   Ht 5' 6 (1.676 m)   Wt 167 lb (75.8 kg)   SpO2 97%   BMI 26.95 kg/m  General: Awake, appears stated age HEENT: Ears patent without otorrhea, no sinus ttp, MMM, no pharyngeal/exudate, nares patent w/o rhinorrhea.  Heart: RRR, no LE edema Abd: BS+, S, NT, ND Lungs: CTAB, no rales, wheezes or rhonchi. No accessory muscle use Psych: Age appropriate judgment and insight, normal affect and mood  Assessment and Plan: Gastroesophageal reflux disease,  unspecified whether esophagitis present  OK to cont Tums as needed. Add Pepcid/famotidine 20 mg bid for the next 10-14 d. Reflux precautions written down and discussed. F/u w reg PCP as originally scheduled.  The patient voiced understanding and agreement to the plan.  Mabel [place], DO 09/19/24  10:11 AM

## 2024-10-02 ENCOUNTER — Other Ambulatory Visit: Payer: Self-pay | Admitting: Family

## 2024-10-04 ENCOUNTER — Other Ambulatory Visit: Payer: Self-pay

## 2024-10-04 NOTE — Telephone Encounter (Signed)
 Requesting: tramadol  50mg  Contract: 11/24/23 UDS: 05/07/24 Last Visit: 09/19/24 w/ Dr Frann Next Visit: 12/19/24 [place] Last Refill: 08/05/24 #60 and 0RF  Please Advise

## 2024-10-04 NOTE — Telephone Encounter (Signed)
 Copied from CRM #[id number]. Topic: Clinical - Medication Question >> Oct 04, 2024  4:44 PM Viola F wrote: Reason for CRM: Patient called to follow up on refill request from 10/02/24 for the traMADol  (ULTRAM ) 50 MG tablet - says the pharmacy is waiting on providers approval and she's out of medication.

## 2024-10-06 ENCOUNTER — Other Ambulatory Visit: Payer: Self-pay | Admitting: Family Medicine

## 2024-10-06 MED ORDER — TRAMADOL HCL 50 MG PO TABS: ORAL_TABLET | ORAL | 0 refills | Status: DC

## 2024-10-29 ENCOUNTER — Encounter: Payer: Self-pay | Admitting: Family Medicine

## 2024-10-29 ENCOUNTER — Ambulatory Visit: Admitting: Obstetrics and Gynecology

## 2024-10-29 ENCOUNTER — Encounter: Payer: Self-pay | Admitting: Obstetrics and Gynecology

## 2024-10-29 VITALS — BP 124/82 | HR 111 | Ht 65.5 in | Wt 165.0 lb

## 2024-10-29 DIAGNOSIS — B372 Candidiasis of skin and nail: Secondary | ICD-10-CM

## 2024-10-29 DIAGNOSIS — Z9189 Other specified personal risk factors, not elsewhere classified: Secondary | ICD-10-CM

## 2024-10-29 DIAGNOSIS — M810 Age-related osteoporosis without current pathological fracture: Secondary | ICD-10-CM

## 2024-10-29 DIAGNOSIS — Z01419 Encounter for gynecological examination (general) (routine) without abnormal findings: Secondary | ICD-10-CM

## 2024-10-29 DIAGNOSIS — B009 Herpesviral infection, unspecified: Secondary | ICD-10-CM | POA: Diagnosis not present

## 2024-10-29 DIAGNOSIS — Z853 Personal history of malignant neoplasm of breast: Secondary | ICD-10-CM

## 2024-10-29 MED ORDER — NYSTATIN 100000 UNIT/GM EX POWD: 1.0000 | Freq: Three times a day (TID) | CUTANEOUS | 2 refills | Status: AC

## 2024-10-29 NOTE — Patient Instructions (Signed)
 Calcium in Foods Calcium is a mineral in the body. Of all the minerals in your body, you have the most calcium. Most of the body's calcium supply is stored in bones and teeth. Calcium helps many parts of the body work, including: Blood and blood vessels. Nerves. Hormones. Muscles. Bones and teeth. When your calcium stores are low, you may be at risk for low bone mass, bone loss, and broken bones. When you get enough calcium, it helps to support strong bones and teeth throughout your life. Calcium is especially important for: Children during growth spurts. Females during adolescence. Females who are pregnant or breastfeeding. Females after their menstrual cycle stops (postmenopausal). Females whose menstrual cycle has stopped because of an eating disorder or regular intense exercise. People who can't eat or digest dairy products. People who eat a vegan diet. Recommended daily amounts of calcium: Females (ages 87 to 55): 1,000 mg per day. Females (ages 35 and older): 1,200 mg per day. Males (ages 53 to 45): 1,000 mg per day. Males (ages 43 and older): 1,200 mg per day. Females (ages 88 to 28): 1,300 mg per day. Males (ages 41 to 56): 1,300 mg per day. General information Eat foods that are high in calcium. Try to get most of your calcium from food. Some people may benefit from taking calcium supplements. Check with your health care provider or an expert in healthy eating called a dietitian before starting any calcium supplements. Calcium supplements may interact with certain medicines. Too much calcium may cause other health problems, such as trouble pooping and kidney stones. For the body to absorb calcium, it needs vitamin D. Sources of vitamin D include: Skin exposure to direct sunlight. Foods, such as egg yolks, liver, mushrooms, saltwater fish, and fortified milk. Vitamin D supplements. Check with your provider or dietitian before starting any vitamin D supplements. The amount of  calcium that is absorbed in the body varies with type of food. Talk to a dietitian about what foods are best for you, especially if you are eat a vegan diet or don't eat dairy. What foods are high in calcium?  Foods that are high in calcium contain more than 100 milligrams per serving. Fruits Fortified orange juice or other fruit juice, 300 mg per 8 oz (237 mL) serving. Vegetables Collard greens, 260 mg per 1 cup (130 g) serving, cooked. Kale, 180 mg per 1 cup (118 g) serving, cooked. Bok choy, 180 mg per 1 cup (170 g) serving, cooked Grains Fortified frozen waffles, 200 mg in 2 waffles. Oatmeal, 180 mg in 1 cup (234 g) serving, cooked. Fortified white bread, 175 mg per slice. Meats and other proteins Sardines, canned with bones, 350 mg per 3.75 oz (92 g) serving. Salmon, canned with bones, 168 mg per 3 oz (85 g) serving. Canned shrimp, 125 mg per 3 oz (85 g) serving. Baked beans, 120 mg per 1 cup (266 g) serving. Tofu, firm, made with calcium sulfate, 861 mg per  cup (126 g) serving. Dairy Yogurt, plain, low-fat, 448 mg per 1 cup (245 g) serving Nonfat milk, 300 mg per 1 cup (245 g) serving. American cheese, 145 mg per 1 oz (21 g) serving or 1 slice. Cheddar cheese, 200 mg per 1 oz (28 g) serving or 1 slice. Cottage cheese 2%, 125 mg per  cup (113 g) serving. Fortified soy, rice, or almond milk, 300 mg per 1 cup (237 mL) serving. Mozzarella, part skim, 210 mg per 1 oz (21 g) serving. The items listed  above may not be a complete list of foods high in calcium. Actual amounts of calcium may be different depending on processing. Contact a dietitian for more information. What foods are lower in calcium? Foods that are lower in calcium contain 50 mg or less per serving. Fruits Apple, 1 medium, about 6 mg. Banana, 1 medium, about 12 mg. Vegetables Lettuce, 19 mg per 1 cup (35 g) serving. Tomato, 1 small, about 11 mg. Grains Rice, white, 8 mg per  cup (79 g) serving. Boiled  potatoes, 14 mg per 1 cup (160 g) serving. White bread, 6 mg per slice. Meats and other proteins Egg, 24 mg per 1 egg (50 g). Red meat, 7 mg per 4 oz (80 g) serving. Chicken, 17 mg per 4 oz (113 g) serving. Fish, cod, or trout, 20 mg per 4 oz (140 g) serving. Dairy Cream cheese, regular, 14 mg per 1 Tbsp (15 g) serving. Brie cheese, 50 mg per 1 oz (32 g) serving. The items listed above may not be a complete list of foods lower in calcium. Actual amounts of calcium may be different depending on processing. Contact a dietitian for more information. This information is not intended to replace advice given to you by your health care provider. Make sure you discuss any questions you have with your health care provider. Document Revised: 08/12/2023 Document Reviewed: 08/12/2023 Elsevier Patient Education  2024 Elsevier Inc.  EXERCISE AND DIET:  We recommended that you start or continue a regular exercise program for good health. Regular exercise means any activity that makes your heart beat faster and makes you sweat.  We recommend exercising at least 30 minutes per day at least 3 days a week, preferably 4 or 5.  We also recommend a diet low in fat and sugar.  Inactivity, poor dietary choices and obesity can cause diabetes, heart attack, stroke, and kidney damage, among others.    ALCOHOL AND SMOKING:  Women should limit their alcohol intake to no more than 7 drinks/beers/glasses of wine (combined, not each!) per week. Moderation of alcohol intake to this level decreases your risk of breast cancer and liver damage. And of course, no recreational drugs are part of a healthy lifestyle.  And absolutely no smoking or even second hand smoke. Most people know smoking can cause heart and lung diseases, but did you know it also contributes to weakening of your bones? Aging of your skin?  Yellowing of your teeth and nails?  CALCIUM AND VITAMIN D:  Adequate intake of calcium and Vitamin D are recommended.  The  recommendations for exact amounts of these supplements seem to change often, but generally speaking 600 mg of calcium (either carbonate or citrate) and 800 units of Vitamin D per day seems prudent. Certain women may benefit from higher intake of Vitamin D.  If you are among these women, your doctor will have told you during your visit.    PAP SMEARS:  Pap smears, to check for cervical cancer or precancers,  have traditionally been done yearly, although recent scientific advances have shown that most women can have pap smears less often.  However, every woman still should have a physical exam from her gynecologist every year. It will include a breast check, inspection of the vulva and vagina to check for abnormal growths or skin changes, a visual exam of the cervix, and then an exam to evaluate the size and shape of the uterus and ovaries.  And after 73 years of age, a rectal exam  is indicated to check for rectal cancers. We will also provide age appropriate advice regarding health maintenance, like when you should have certain vaccines, screening for sexually transmitted diseases, bone density testing, colonoscopy, mammograms, etc.   MAMMOGRAMS:  All women over 11 years old should have a yearly mammogram. Many facilities now offer a "3D" mammogram, which may cost around $50 extra out of pocket. If possible,  we recommend you accept the option to have the 3D mammogram performed.  It both reduces the number of women who will be called back for extra views which then turn out to be normal, and it is better than the routine mammogram at detecting truly abnormal areas.    COLONOSCOPY:  Colonoscopy to screen for colon cancer is recommended for all women at age 8.  We know, you hate the idea of the prep.  We agree, BUT, having colon cancer and not knowing it is worse!!  Colon cancer so often starts as a polyp that can be seen and removed at colonscopy, which can quite literally save your life!  And if your first  colonoscopy is normal and you have no family history of colon cancer, most women don't have to have it again for 10 years.  Once every ten years, you can do something that may end up saving your life, right?  We will be happy to help you get it scheduled when you are ready.  Be sure to check your insurance coverage so you understand how much it will cost.  It may be covered as a preventative service at no cost, but you should check your particular policy.

## 2024-10-29 NOTE — Progress Notes (Unsigned)
 73 y.o. G2P2 Widowed Caucasian female here for a breast and pelvic exam.    Hx HSV I.  Takes Neurontin  300 mg at hs for hot flashes.   Taking Reclast  for osteoporosis.   PCP prescribing.    Has had a bilateral wrist fractures.    Going to [employer].    Husband passed in 2019.    PCP: Domenica Harlene LABOR, MD   No LMP recorded. Patient is postmenopausal.           Sexually active: No.  The current method of family planning is post menopausal status.    Menopausal hormone therapy:  n/a Exercising: Yes.    [employer] 3x week  Smoker:  Former   OB History     Gravida  2   Para  2   Term      Preterm      AB      Living  2      SAB      IAB      Ectopic      Multiple      Live Births              HEALTH MAINTENANCE: Last 2 paps: 10/24/22 neg, HR HPV neg,09/29/17 neg  History of abnormal Pap or positive HPV:  no Mammogram:  12/13/23 Breast Density Cat C, BIRADS Cat 1 neg  Colonoscopy:  09/19/19 - due in 2027 Bone Density:  03/23/23  Result  osteoporotic    Immunization History  Administered Date(s) Administered   Fluad Quad(high Dose 65+) 08/11/2022   Fluad Trivalent(High Dose 65+) 09/19/2023   INFLUENZA, HIGH DOSE SEASONAL PF 09/28/2021, 09/16/2024   Influenza Split 09/07/2012, 08/21/2014   Influenza Whole 08/16/2013   Influenza,inj,Quad PF,6+ Mos 08/21/2015   Influenza-Unspecified 08/25/2016   PFIZER(Purple Top)SARS-COV-2 Vaccination 02/20/2020, 03/18/2020, 11/11/2020   PNEUMOCOCCAL CONJUGATE-20 09/19/2023   Pneumococcal Conjugate-13 08/29/2013   Respiratory Syncytial Virus Vaccine,Recomb Aduvanted(Arexvy) 11/16/2023   Tdap 11/28/2010, 08/21/2014, 05/01/2018   Zoster Recombinant(Shingrix) 08/24/2017, 12/29/2017   Zoster, Live 11/01/2013      reports that she has quit smoking. Her smoking use included cigarettes. She has never used smokeless tobacco. She reports current alcohol use. She reports that she does not use drugs.  Past  Medical History:  Diagnosis Date   Arthritis    Cancer [facility]) [id number]   Breast   Chicken pox    Colon polyps    Depression with anxiety 09/07/2012   Dyslipidemia    Elevated BP 09/09/2012   H/O tobacco use, presenting hazards to health 05/05/2011   Left hip pain 05/04/2011   Osteoporosis 10/2019   T score -2.2.  Stable from prior DEXA 2018. DEXA 2014 T score -2.5   Preventative health care 09/09/2012   Tobacco abuse 05/05/2011   quit 2025    Past Surgical History:  Procedure Laterality Date   APPENDECTOMY  2010   BREAST LUMPECTOMY  1998   right   BREAST SURGERY  07/08/11   right mastectomy+axlnd, reconstruction,T1cN1a,ERPR+,HER2-   BREAST SURGERY  march,2013   breast implant deflated   COLONOSCOPY     POLYPECTOMY     WRIST FRACTURE SURGERY      Current Outpatient Medications  Medication Sig Dispense Refill   acetaminophen  (TYLENOL ) 500 MG tablet Take 500 mg by mouth daily as needed for moderate pain.     acyclovir  ointment (ZOVIRAX ) 5 % Apply 6 times a day for 7 days as needed. 15 g 0   ALPRAZolam  (XANAX )  0.25 MG tablet TAKE 1 TABLET BY MOUTH 2 TIMES DAILY AS NEEDED FOR ANXIETY. 20 tablet 1   Ascorbic Acid (VITAMIN C PO) Take by mouth.     escitalopram  (LEXAPRO ) 10 MG tablet Take 1 tablet (10 mg total) by mouth daily. 90 tablet 0   gabapentin  (NEURONTIN ) 300 MG capsule Take 1 capsule (300 mg total) by mouth at bedtime. 90 capsule 0   ibuprofen (ADVIL,MOTRIN) 400 MG tablet Take 400 mg by mouth every 6 (six) hours as needed for pain.     Multiple Vitamins-Minerals (CENTRUM SILVER PO) Take by mouth.     nystatin -triamcinolone  ointment (MYCOLOG) Apply 1 Application topically 2 (two) times daily. 30 g 0   pravastatin  (PRAVACHOL ) 40 MG tablet Take 1 tablet (40 mg total) by mouth daily. 90 tablet 1   traMADol  (ULTRAM ) 50 MG tablet TAKE 1 TABLET BY MOUTH TWICE A DAY AS NEEDED FOR MODERATE PAIN 60 tablet 0   triamcinolone  cream (KENALOG ) 0.1 % APPLY TO AFFECTED AREA TWICE A DAY  60 g 1   valACYclovir  (VALTREX ) 500 MG tablet Take 1 tablet (500 mg total) by mouth 2 (two) times daily. For 3 days 30 tablet 0   Vitamin D , Ergocalciferol , (DRISDOL ) 1.25 MG (50000 UNIT) CAPS capsule TAKE 1 CAPSULE (50,000 UNITS TOTAL) BY MOUTH EVERY 7 (SEVEN) DAYS 12 capsule 1   No current facility-administered medications for this visit.    ALLERGIES: Patient has no known allergies.  Family History  Problem Relation Age of Onset   Diabetes Mother        type 2   Obesity Mother    Cancer Father        ?   Heart block Sister    Heart disease Sister        s/p CABG   Polymyalgia rheumatica Sister    Heart disease Brother        s/p CABG   Colon cancer Neg Hx    Esophageal cancer Neg Hx    Rectal cancer Neg Hx    Stomach cancer Neg Hx    Colon polyps Neg Hx     Review of Systems  All other systems reviewed and are negative.   PHYSICAL EXAM:  BP 124/82 (BP Location: Right Arm, Patient Position: Sitting)   Pulse (!) 111   Ht 5' 5.5 (1.664 m)   Wt 165 lb (74.8 kg)   SpO2 97%   BMI 27.04 kg/m     General appearance: alert, cooperative and appears stated age Head: normocephalic, without obvious abnormality, atraumatic Neck: no adenopathy, supple, symmetrical, trachea midline and thyroid  normal to inspection and palpation Lungs: clear to auscultation bilaterally Breasts:  Right - absent with reconstruction present. Left - normal appearance, no masses or tenderness, left nipple inversion.  No axillary adenopathy.  Rash with erythema under left breast.  Heart: regular rate and rhythm Abdomen: soft, non-tender; no masses, no organomegaly Extremities: extremities normal, atraumatic, no cyanosis or edema Skin: skin color, texture, turgor normal. No rashes or lesions Lymph nodes: cervical, supraclavicular, and axillary nodes normal. Neurologic: grossly normal  Pelvic: External genitalia:  no lesions              No abnormal inguinal nodes palpated.              Urethra:   normal appearing urethra with no masses, tenderness or lesions              Bartholins and Skenes: normal  Vagina: normal appearing vagina with normal color and discharge, no lesions              Cervix: no lesions              Pap taken: no Bimanual Exam:  Uterus:  normal size, contour, position, consistency, mobility, non-tender              Adnexa: no mass, fullness, tenderness              Rectal exam: yes.  Confirms.              Anus:  normal sphincter tone, no lesions  Chaperone was present for exam:  Kari HERO, CMA  ASSESSMENT: Encounter for breast and pelvic exam.  Personal history of other risk factors.  Status post right mastectomy for breast cancer recurrence.  Had reconstruction.  Status post tamoxifen x 5 years with original cancer dx.  BRCA negative.  Left nipple inversion.  Old change.  Hx osteoporosis.  Reclast  through PCP.  Candida of flexural skin.   PLAN: Mammogram screening discussed. Self breast awareness reviewed. Pap and HRV collected:  no.  Due in 2028 Guidelines for Calcium, Vitamin D , regular exercise program including cardiovascular and weight bearing exercise. Medication refills:  Nystatin  powder tid x 1 week prn. Dexa April 2026.  Follow up:  1 year for breast and pelvic exam.   Additional counseling given.  yes. 25 min  total time was spent for this patient encounter, including preparation, face-to-face counseling with the patient, coordination of care, and documentation of the encounter in addition to doing the breast and pelvic exam.

## 2024-10-30 ENCOUNTER — Other Ambulatory Visit: Payer: Self-pay | Admitting: Family Medicine

## 2024-10-30 ENCOUNTER — Other Ambulatory Visit: Payer: Self-pay

## 2024-10-30 NOTE — Progress Notes (Signed)
 Reclast  infusion ordered per PCP. See previous phone encounter.

## 2024-10-31 ENCOUNTER — Encounter (HOSPITAL_COMMUNITY): Payer: Self-pay | Admitting: Family Medicine

## 2024-11-01 ENCOUNTER — Telehealth (HOSPITAL_COMMUNITY): Payer: Self-pay

## 2024-11-01 NOTE — Telephone Encounter (Signed)
 Auth Submission: APPROVED Site of care: Site of care: [facility] Payer: [employer] Medicare Medication & CPT/J Code(s) submitted: Reclast  (Zolendronic acid) [id number] Diagnosis Code: M81.0 Route of submission (phone, fax, portal): portal Phone # Fax # Auth type: Buy/Bill HB Units/visits requested: 5mg  x 1 dose Reference number: [id number] Approval from: 11/01/24 to 11/01/25

## 2024-11-06 ENCOUNTER — Inpatient Hospital Stay (HOSPITAL_COMMUNITY): Admission: RE | Admit: 2024-11-06 | Discharge: 2024-11-06 | Attending: Family Medicine

## 2024-11-06 VITALS — BP 149/77 | HR 82 | Temp 98.3°F | Resp 16

## 2024-11-06 DIAGNOSIS — M810 Age-related osteoporosis without current pathological fracture: Secondary | ICD-10-CM

## 2024-11-06 MED ORDER — SODIUM CHLORIDE 0.9 % IV SOLN: INTRAVENOUS | Status: DC

## 2024-11-06 MED ORDER — DIPHENHYDRAMINE HCL 25 MG PO CAPS
ORAL_CAPSULE | ORAL | Status: AC
  Filled 2024-11-06: qty 1

## 2024-11-06 MED ORDER — DIPHENHYDRAMINE HCL 25 MG PO CAPS
25.0000 mg | ORAL_CAPSULE | Freq: Once | ORAL | Status: AC
  Administered 2024-11-06: 25 mg via ORAL

## 2024-11-06 MED ORDER — ACETAMINOPHEN 325 MG PO TABS
ORAL_TABLET | ORAL | Status: AC
  Filled 2024-11-06: qty 2

## 2024-11-06 MED ORDER — ZOLEDRONIC ACID 5 MG/100ML IV SOLN
5.0000 mg | Freq: Once | INTRAVENOUS | Status: AC
  Administered 2024-11-06: 5 mg via INTRAVENOUS

## 2024-11-06 MED ORDER — ZOLEDRONIC ACID 5 MG/100ML IV SOLN
INTRAVENOUS | Status: AC
  Filled 2024-11-06: qty 100

## 2024-11-06 MED ORDER — ACETAMINOPHEN 325 MG PO TABS
650.0000 mg | ORAL_TABLET | Freq: Once | ORAL | Status: AC
  Administered 2024-11-06: 650 mg via ORAL

## 2024-11-08 ENCOUNTER — Encounter: Admitting: Family Medicine

## 2024-11-08 ENCOUNTER — Telehealth: Payer: Self-pay

## 2024-11-08 NOTE — Telephone Encounter (Signed)
 Copied from CRM #[id number]. Topic: Clinical - Medication Question >> Nov 08, 2024 10:20 AM Tinnie BROCKS wrote: Reason for CRM: Sam with [facility] calling to let us  know that alprazolam  and tramadol  are not recommended to be taken together due to sedation and respiratory depression and suggest stopping or reducing these medications if not already due to age. If unable to stop they can provide alternatives. Will be faxing us  this information. Best cb# [phone]

## 2024-11-12 ENCOUNTER — Other Ambulatory Visit: Payer: Self-pay | Admitting: Family Medicine

## 2024-11-12 MED ORDER — BUSPIRONE HCL 5 MG PO TABS: 5.0000 mg | ORAL_TABLET | Freq: Two times a day (BID) | ORAL | 0 refills | Status: DC | PRN

## 2024-11-12 NOTE — Telephone Encounter (Signed)
 Called patient and no answer left message to return call.

## 2024-11-12 NOTE — Telephone Encounter (Signed)
 Patient returned call and was scheduled on 04/07/25 @ 1:40 PM.

## 2024-11-12 NOTE — Telephone Encounter (Signed)
 Called pt and no answer left message to return call.

## 2024-11-12 NOTE — Telephone Encounter (Addendum)
 Patient returned call and stated that she will like to continue the Tramadol  and start taking the Buspar . Xanax  0.25 MG was discontinued off patients med list.

## 2024-11-12 NOTE — Addendum Note (Signed)
 Addended by: Myquan Schaumburg C on: 11/12/2024 09:29 AM   Modules accepted: Orders

## 2024-11-22 ENCOUNTER — Other Ambulatory Visit: Payer: Self-pay | Admitting: Family Medicine

## 2024-11-28 ENCOUNTER — Other Ambulatory Visit: Payer: Self-pay | Admitting: Family Medicine

## 2024-11-29 ENCOUNTER — Other Ambulatory Visit: Payer: Self-pay | Admitting: Family Medicine

## 2024-11-29 NOTE — Telephone Encounter (Signed)
 Duplicate request   Requesting: Tramadol  50 MG Contract: 09/19/2023 UDS: 05/07/2024 Last Visit: 09/19/2024 Next Visit: 04/07/2025 Last Refill: 10/06/2024   Please Advise

## 2024-11-29 NOTE — Telephone Encounter (Signed)
 Copied from CRM #[id number]. Topic: Clinical - Medication Refill >> Nov 29, 2024 12:49 PM Viola F wrote: Medication: traMADol  (ULTRAM ) 50 MG tablet [[id number]]  Has the patient contacted their pharmacy? Yes (Agent: If no, request that the patient contact the pharmacy for the refill. If patient does not wish to contact the pharmacy document the reason why and proceed with request.) (Agent: If yes, when and what did the pharmacy advise?)  This is the patient's preferred pharmacy:  [id number] - [place], [place] - [address] [place] [address] [place] [address] [place] [address] Phone: [phone] Fax: [phone]   Is this the correct pharmacy for this prescription? Yes If no, delete pharmacy and type the correct one.   Has the prescription been filled recently? Yes  Is the patient out of the medication? No  Has the patient been seen for an appointment in the last year OR does the patient have an upcoming appointment? Yes  Can we respond through MyChart? Yes  Agent: Please be advised that Rx refills may take up to 3 business days. We ask that you follow-up with your pharmacy.

## 2024-11-29 NOTE — Telephone Encounter (Signed)
 Requesting: Tramadol  50 MG Contract: 09/19/2023 UDS: 05/07/2024 Last Visit: 09/19/2024 Next Visit: 04/07/2025 Last Refill: 10/06/2024  Please Advise

## 2024-12-17 DIAGNOSIS — F17211 Nicotine dependence, cigarettes, in remission: Secondary | ICD-10-CM | POA: Insufficient documentation

## 2024-12-17 DIAGNOSIS — K219 Gastro-esophageal reflux disease without esophagitis: Secondary | ICD-10-CM | POA: Insufficient documentation

## 2024-12-17 NOTE — Assessment & Plan Note (Signed)
 Encouraged adequate exercise, encouraged calcium and vitamin D  intake. Manage with Reclast  (Zolendronic Acid) IV infusion

## 2024-12-17 NOTE — Progress Notes (Unsigned)
 "  Subjective:     Patient ID: [name], female    DOB: [birth year], 74 y.o.   MRN: [id number]  No chief complaint on file.   HPI  Discussed the use of AI scribe software for clinical note transcription with the patient, who gave verbal consent to proceed.  Also recommend weight baring exercise on hips and upper body to keep bones strong consider Fosamax  70 mg weekly if patient willing to take ***  History of Present Illness        HPI Presents for FU on chronic conditions.   GERD smptoms?  Gabapentin ???--what for?   Osteoporosis Failed oral medications, Prolia copay so she She is doing Reclast  (Zolendronic Acid) IV infusion Had first IV inficsion done 10-2023 at [facility] Reports no adverse SE, she reports she will FU in 1 year Last Dexa: 4/25/2024The BMD measured at Femur Neck Right is 0.656 g/cm2 with a T-score of -2.7. repeat 2-5 years.     Vitamin D  deficiency Taking 50,000 units weekly   Depression w/ Anxiety Lexapro  10 mg daily, Buspar  5 mg BID as needed     Patient denies fever, chills, SOB, CP, palpitations, dyspnea, edema, HA, vision changes, N/V/D, abdominal pain, urinary symptoms,  weight changes, and recent illness or hospitalizations.       Health Maintenance Due  Topic Date Due   COVID-19 Vaccine (4 - 2025-26 season) 07/29/2024   Mammogram  12/12/2024    Past Medical History:  Diagnosis Date   Arthritis    Cancer [facility]) [id number]   Breast   Chicken pox    Colon polyps    Depression with anxiety 09/07/2012   Dyslipidemia    Elevated BP 09/09/2012   H/O tobacco use, presenting hazards to health 05/05/2011   Left hip pain 05/04/2011   Osteoporosis 10/2019   T score -2.2.  Stable from prior DEXA 2018. DEXA 2014 T score -2.5   Preventative health care 09/09/2012   Tobacco abuse 05/05/2011   quit 2025    Past Surgical History:  Procedure Laterality Date   APPENDECTOMY  2010   BREAST LUMPECTOMY  1998   right    BREAST SURGERY  07/08/11   right mastectomy+axlnd, reconstruction,T1cN1a,ERPR+,HER2-   BREAST SURGERY  march,2013   breast implant deflated   COLONOSCOPY     POLYPECTOMY     WRIST FRACTURE SURGERY      Family History  Problem Relation Age of Onset   Diabetes Mother        type 2   Obesity Mother    Cancer Father        ?   Heart block Sister    Heart disease Sister        s/p CABG   Polymyalgia rheumatica Sister    Heart disease Brother        s/p CABG   Colon cancer Neg Hx    Esophageal cancer Neg Hx    Rectal cancer Neg Hx    Stomach cancer Neg Hx    Colon polyps Neg Hx     Social History   Socioeconomic History   Marital status: Widowed    Spouse name: Not on file   Number of children: Not on file   Years of education: Not on file   Highest education level: Not on file  Occupational History   Not on file  Tobacco Use   Smoking status: Former    Types: Cigarettes   Smokeless tobacco: Never  Vaping  Use   Vaping status: Never Used  Substance and Sexual Activity   Alcohol use: Yes    Alcohol/week: 0.0 standard drinks of alcohol    Comment: occasionally   Drug use: No   Sexual activity: Not Currently    Birth control/protection: Post-menopausal    Comment: hsv 1, >5 partners, after 16(18), no abnormal pap, no cancer, no DES  Other Topics Concern   Not on file  Social History Narrative   Not on file   Social Drivers of Health   Tobacco Use: Medium Risk (10/29/2024)   Patient History    Smoking Tobacco Use: Former    Smokeless Tobacco Use: Never    Passive Exposure: Not on Actuary Strain: Not on file  Food Insecurity: Not on file  Transportation Needs: Not on file  Physical Activity: Not on file  Stress: Not on file  Social Connections: Not on file  Intimate Partner Violence: Not on file  Depression (PHQ2-9): Medium Risk (09/12/2024)   Depression (PHQ2-9)    PHQ-2 Score: 5  Alcohol Screen: Not on file  Housing: Not on file   Utilities: Not on file  Health Literacy: Not on file    Outpatient Medications Prior to Visit  Medication Sig Dispense Refill   traMADol  (ULTRAM ) 50 MG tablet TAKE 1 TABLET BY MOUTH TWICE A DAY AS NEEDED FOR MODERATE PAIN 60 tablet 0   acetaminophen  (TYLENOL ) 500 MG tablet Take 500 mg by mouth daily as needed for moderate pain.     acyclovir  ointment (ZOVIRAX ) 5 % Apply 6 times a day for 7 days as needed. 15 g 0   Ascorbic Acid (VITAMIN C PO) Take by mouth.     busPIRone  (BUSPAR ) 5 MG tablet TAKE 1 TABLET BY MOUTH 2 TIMES DAILY AS NEEDED. 180 tablet 1   escitalopram  (LEXAPRO ) 10 MG tablet Take 1 tablet (10 mg total) by mouth daily. 90 tablet 1   gabapentin  (NEURONTIN ) 300 MG capsule Take 1 capsule (300 mg total) by mouth at bedtime. 90 capsule 1   ibuprofen (ADVIL,MOTRIN) 400 MG tablet Take 400 mg by mouth every 6 (six) hours as needed for pain.     Multiple Vitamins-Minerals (CENTRUM SILVER PO) Take by mouth.     nystatin  (MYCOSTATIN /NYSTOP ) powder Apply 1 Application topically 3 (three) times daily. Apply to affected area for up to 7 days 15 g 2   nystatin -triamcinolone  ointment (MYCOLOG) Apply 1 Application topically 2 (two) times daily. 30 g 0   pravastatin  (PRAVACHOL ) 40 MG tablet Take 1 tablet (40 mg total) by mouth daily. 90 tablet 1   triamcinolone  cream (KENALOG ) 0.1 % APPLY TO AFFECTED AREA TWICE A DAY 60 g 1   valACYclovir  (VALTREX ) 500 MG tablet Take 1 tablet (500 mg total) by mouth 2 (two) times daily. For 3 days 30 tablet 0   Vitamin D , Ergocalciferol , (DRISDOL ) 1.25 MG (50000 UNIT) CAPS capsule TAKE 1 CAPSULE (50,000 UNITS TOTAL) BY MOUTH EVERY 7 (SEVEN) DAYS 12 capsule 1   No facility-administered medications prior to visit.    Allergies[1]  ROS     Objective:    Physical Exam   There were no vitals taken for this visit. Wt Readings from Last 3 Encounters:  10/29/24 165 lb (74.8 kg)  09/19/24 167 lb (75.8 kg)  09/12/24 165 lb (74.8 kg)       Assessment &  Plan:   Problem List Items Addressed This Visit       Digestive   Gastroesophageal  reflux disease     Endocrine   Hyperglycemia   Check A1c today. Minimize simple carbs. Increase exercise as tolerated.         Musculoskeletal and Integument   Osteoporosis   Encouraged adequate exercise, encouraged calcium and vitamin D  intake. Manage with Reclast  (Zolendronic Acid) IV infusion         Other   Anxiety and depression   Stable on current meds. Continue same. Managed with Lexapro  and buspar .      Cigarette nicotine  dependence in remission   Dyslipidemia   Tolerating Pravastatin . Encourage heart healthy diet such as MIND or DASH diet, increase exercise, avoid trans fats, simple carbohydrates and processed foods, consider a krill or fish or flaxseed oil cap daily.        Preventative health care - Primary   Vitamin D  deficiency    I am having Jiali M. Alix maintain her ibuprofen, acetaminophen , Multiple Vitamins-Minerals (CENTRUM SILVER PO), Ascorbic Acid (VITAMIN C PO), triamcinolone  cream, Vitamin D  (Ergocalciferol ), nystatin -triamcinolone  ointment, valACYclovir , acyclovir  ointment, pravastatin , nystatin , escitalopram , gabapentin , busPIRone , and traMADol .  No orders of the defined types were placed in this encounter.     [1] No Known Allergies  "

## 2024-12-17 NOTE — Assessment & Plan Note (Signed)
 Check A1c today. Minimize simple carbs. Increase exercise as tolerated.

## 2024-12-17 NOTE — Assessment & Plan Note (Signed)
 Tolerating Pravastatin . Encourage heart healthy diet such as MIND or DASH diet, increase exercise, avoid trans fats, simple carbohydrates and processed foods, consider a krill or fish or flaxseed oil cap daily.

## 2024-12-17 NOTE — Assessment & Plan Note (Addendum)
 Stable on current meds. Continue same. Managed with Lexapro  and buspar .

## 2024-12-18 LAB — HM MAMMOGRAPHY

## 2024-12-19 ENCOUNTER — Ambulatory Visit (INDEPENDENT_AMBULATORY_CARE_PROVIDER_SITE_OTHER): Admitting: Student

## 2024-12-19 ENCOUNTER — Encounter: Payer: Self-pay | Admitting: Student

## 2024-12-19 VITALS — BP 133/78 | HR 92 | Resp 14 | Ht 65.5 in | Wt 168.8 lb

## 2024-12-19 DIAGNOSIS — K219 Gastro-esophageal reflux disease without esophagitis: Secondary | ICD-10-CM

## 2024-12-19 DIAGNOSIS — M810 Age-related osteoporosis without current pathological fracture: Secondary | ICD-10-CM

## 2024-12-19 DIAGNOSIS — F32A Depression, unspecified: Secondary | ICD-10-CM | POA: Diagnosis not present

## 2024-12-19 DIAGNOSIS — R739 Hyperglycemia, unspecified: Secondary | ICD-10-CM | POA: Diagnosis not present

## 2024-12-19 DIAGNOSIS — F17211 Nicotine dependence, cigarettes, in remission: Secondary | ICD-10-CM | POA: Diagnosis not present

## 2024-12-19 DIAGNOSIS — F419 Anxiety disorder, unspecified: Secondary | ICD-10-CM | POA: Diagnosis not present

## 2024-12-19 DIAGNOSIS — E559 Vitamin D deficiency, unspecified: Secondary | ICD-10-CM

## 2024-12-19 DIAGNOSIS — E785 Hyperlipidemia, unspecified: Secondary | ICD-10-CM | POA: Diagnosis not present

## 2024-12-19 DIAGNOSIS — E663 Overweight: Secondary | ICD-10-CM

## 2024-12-19 DIAGNOSIS — B001 Herpesviral vesicular dermatitis: Secondary | ICD-10-CM

## 2024-12-19 DIAGNOSIS — Z87891 Personal history of nicotine dependence: Secondary | ICD-10-CM

## 2024-12-19 DIAGNOSIS — Z0000 Encounter for general adult medical examination without abnormal findings: Secondary | ICD-10-CM

## 2024-12-19 LAB — COMPREHENSIVE METABOLIC PANEL WITH GFR
ALT: 16 U/L (ref 3–35)
AST: 17 U/L (ref 5–37)
Albumin: 4.2 g/dL (ref 3.5–5.2)
Alkaline Phosphatase: 57 U/L (ref 39–117)
BUN: 15 mg/dL (ref 6–23)
CO2: 31 meq/L (ref 19–32)
Calcium: 9.1 mg/dL (ref 8.4–10.5)
Chloride: 102 meq/L (ref 96–112)
Creatinine, Ser: 0.56 mg/dL (ref 0.40–1.20)
GFR: 90.34 mL/min
Glucose, Bld: 93 mg/dL (ref 70–99)
Potassium: 4.7 meq/L (ref 3.5–5.1)
Sodium: 139 meq/L (ref 135–145)
Total Bilirubin: 0.4 mg/dL (ref 0.2–1.2)
Total Protein: 7 g/dL (ref 6.0–8.3)

## 2024-12-19 LAB — HEPATIC FUNCTION PANEL
ALT: 16 U/L (ref 3–35)
AST: 17 U/L (ref 5–37)
Albumin: 4.2 g/dL (ref 3.5–5.2)
Alkaline Phosphatase: 57 U/L (ref 39–117)
Bilirubin, Direct: 0.1 mg/dL (ref 0.1–0.3)
Total Bilirubin: 0.4 mg/dL (ref 0.2–1.2)
Total Protein: 7 g/dL (ref 6.0–8.3)

## 2024-12-19 LAB — VITAMIN D 25 HYDROXY (VIT D DEFICIENCY, FRACTURES): VITD: 37.65 ng/mL (ref 30.00–100.00)

## 2024-12-19 LAB — CBC
HCT: 40.7 % (ref 36.0–46.0)
Hemoglobin: 13.6 g/dL (ref 12.0–15.0)
MCHC: 33.5 g/dL (ref 30.0–36.0)
MCV: 89 fl (ref 78.0–100.0)
Platelets: 322 K/uL (ref 150.0–400.0)
RBC: 4.57 Mil/uL (ref 3.87–5.11)
RDW: 14.5 % (ref 11.5–15.5)
WBC: 7.3 K/uL (ref 4.0–10.5)

## 2024-12-19 LAB — HEMOGLOBIN A1C: Hgb A1c MFr Bld: 5.7 % (ref 4.6–6.5)

## 2024-12-19 MED ORDER — ACYCLOVIR 5 % EX OINT: TOPICAL_OINTMENT | CUTANEOUS | 0 refills | Status: AC

## 2024-12-19 NOTE — Assessment & Plan Note (Addendum)
 Quit July 2025, Doing well. Concerned about weight gain. Encourage physical activity.  Offered referral for LDCT lung cancer screening; patient declines at this time but may reconsider in the future.

## 2024-12-19 NOTE — Assessment & Plan Note (Addendum)
 BMI Readings from Last 1 Encounters:  12/19/24 27.66 kg/m  Pt reports weight gain since smoking cessation, attributed to increased sweets consumption. - Discussed healthy snacking options. - Refer to [facility] for additional support.

## 2024-12-19 NOTE — Assessment & Plan Note (Addendum)
 Managed with acyclovir  ointment and oral medication. Recent flare-up, Rx refilled.

## 2024-12-19 NOTE — Assessment & Plan Note (Addendum)
 Patient encouraged to maintain heart healthy diet, regular exercise, adequate sleep. Consider daily probiotics. Take medications as prescribed.  Colonoscopy 2020 repeat in 2027 Dexa 02/2023 repeat every 2-5 years Mgm 11/2023 repeat every 1-2 years

## 2024-12-19 NOTE — Assessment & Plan Note (Signed)
 Stable on famotidine prn.

## 2024-12-20 ENCOUNTER — Ambulatory Visit: Payer: Self-pay | Admitting: Student

## 2024-12-20 LAB — LIPID PANEL
Cholesterol: 160 mg/dL
HDL: 52 mg/dL
LDL Cholesterol (Calc): 88 mg/dL
Non-HDL Cholesterol (Calc): 108 mg/dL
Total CHOL/HDL Ratio: 3.1 (calc)
Triglycerides: 104 mg/dL

## 2024-12-20 MED ORDER — VITAMIN D3 25 MCG (1000 UT) PO CAPS: 1000.0000 [IU] | ORAL_CAPSULE | Freq: Every day | ORAL | Status: AC

## 2025-01-01 ENCOUNTER — Encounter (INDEPENDENT_AMBULATORY_CARE_PROVIDER_SITE_OTHER): Payer: Self-pay

## 2025-03-24 ENCOUNTER — Other Ambulatory Visit (HOSPITAL_BASED_OUTPATIENT_CLINIC_OR_DEPARTMENT_OTHER)

## 2025-04-07 ENCOUNTER — Ambulatory Visit: Admitting: Family Medicine

## 2025-04-14 ENCOUNTER — Ambulatory Visit: Admitting: Family Medicine

## 2025-06-18 ENCOUNTER — Encounter: Admitting: Student

## 2025-11-04 ENCOUNTER — Encounter: Admitting: Obstetrics and Gynecology
